# Patient Record
Sex: Female | Born: 1960 | Race: White | Hispanic: No | Marital: Married | Smoking: Former smoker
Health system: Southern US, Community
[De-identification: ages and names within clinical notes are randomized; demographics above are authoritative.]

## PROBLEM LIST (undated history)

## (undated) DIAGNOSIS — F329 Major depressive disorder, single episode, unspecified: Secondary | ICD-10-CM

## (undated) DIAGNOSIS — E875 Hyperkalemia: Secondary | ICD-10-CM

## (undated) DIAGNOSIS — M109 Gout, unspecified: Secondary | ICD-10-CM

## (undated) DIAGNOSIS — L409 Psoriasis, unspecified: Secondary | ICD-10-CM

## (undated) DIAGNOSIS — N179 Acute kidney failure, unspecified: Secondary | ICD-10-CM

## (undated) DIAGNOSIS — F32A Depression, unspecified: Secondary | ICD-10-CM

## (undated) DIAGNOSIS — R87619 Unspecified abnormal cytological findings in specimens from cervix uteri: Secondary | ICD-10-CM

## (undated) DIAGNOSIS — M1990 Unspecified osteoarthritis, unspecified site: Secondary | ICD-10-CM

## (undated) DIAGNOSIS — S42301A Unspecified fracture of shaft of humerus, right arm, initial encounter for closed fracture: Secondary | ICD-10-CM

## (undated) DIAGNOSIS — R188 Other ascites: Secondary | ICD-10-CM

## (undated) DIAGNOSIS — K219 Gastro-esophageal reflux disease without esophagitis: Secondary | ICD-10-CM

## (undated) DIAGNOSIS — K469 Unspecified abdominal hernia without obstruction or gangrene: Secondary | ICD-10-CM

## (undated) DIAGNOSIS — I10 Essential (primary) hypertension: Secondary | ICD-10-CM

## (undated) DIAGNOSIS — K7030 Alcoholic cirrhosis of liver without ascites: Secondary | ICD-10-CM

## (undated) DIAGNOSIS — D649 Anemia, unspecified: Secondary | ICD-10-CM

## (undated) DIAGNOSIS — IMO0002 Reserved for concepts with insufficient information to code with codable children: Secondary | ICD-10-CM

## (undated) HISTORY — PX: DILATION AND CURETTAGE OF UTERUS: SHX78

## (undated) HISTORY — PX: COLPOSCOPY: SHX161

## (undated) SURGERY — Surgical Case
Anesthesia: *Unknown

---

## 2000-05-15 ENCOUNTER — Encounter: Admission: RE | Admit: 2000-05-15 | Discharge: 2000-05-20 | Payer: Self-pay | Admitting: *Deleted

## 2005-10-20 ENCOUNTER — Emergency Department (HOSPITAL_COMMUNITY): Admission: EM | Admit: 2005-10-20 | Discharge: 2005-10-20 | Payer: Self-pay | Admitting: Emergency Medicine

## 2006-08-10 ENCOUNTER — Encounter: Admission: RE | Admit: 2006-08-10 | Discharge: 2006-08-10 | Payer: Self-pay | Admitting: Internal Medicine

## 2006-11-19 ENCOUNTER — Encounter: Admission: RE | Admit: 2006-11-19 | Discharge: 2006-11-19 | Payer: Self-pay | Admitting: Internal Medicine

## 2011-12-27 ENCOUNTER — Inpatient Hospital Stay (HOSPITAL_COMMUNITY): Payer: Self-pay

## 2011-12-27 ENCOUNTER — Inpatient Hospital Stay (HOSPITAL_COMMUNITY)
Admission: AD | Admit: 2011-12-27 | Discharge: 2011-12-27 | Disposition: A | Discharge status: Home / Self Care | Payer: Self-pay | Source: Ambulatory Visit | Attending: Obstetrics & Gynecology | Admitting: Obstetrics & Gynecology

## 2011-12-27 ENCOUNTER — Ambulatory Visit: Payer: Self-pay | Admitting: Family Medicine

## 2011-12-27 ENCOUNTER — Encounter (HOSPITAL_COMMUNITY): Payer: Self-pay | Admitting: *Deleted

## 2011-12-27 VITALS — BP 109/72 | HR 85 | Temp 98.2°F | Resp 16 | Ht 63.5 in | Wt 191.0 lb

## 2011-12-27 DIAGNOSIS — IMO0001 Reserved for inherently not codable concepts without codable children: Secondary | ICD-10-CM

## 2011-12-27 DIAGNOSIS — R1030 Lower abdominal pain, unspecified: Secondary | ICD-10-CM

## 2011-12-27 DIAGNOSIS — N949 Unspecified condition associated with female genital organs and menstrual cycle: Secondary | ICD-10-CM | POA: Insufficient documentation

## 2011-12-27 DIAGNOSIS — R109 Unspecified abdominal pain: Secondary | ICD-10-CM

## 2011-12-27 DIAGNOSIS — M7918 Myalgia, other site: Secondary | ICD-10-CM

## 2011-12-27 HISTORY — DX: Gastro-esophageal reflux disease without esophagitis: K21.9

## 2011-12-27 HISTORY — DX: Major depressive disorder, single episode, unspecified: F32.9

## 2011-12-27 HISTORY — DX: Reserved for concepts with insufficient information to code with codable children: IMO0002

## 2011-12-27 HISTORY — DX: Unspecified abnormal cytological findings in specimens from cervix uteri: R87.619

## 2011-12-27 HISTORY — DX: Unspecified osteoarthritis, unspecified site: M19.90

## 2011-12-27 HISTORY — DX: Depression, unspecified: F32.A

## 2011-12-27 HISTORY — DX: Unspecified abdominal hernia without obstruction or gangrene: K46.9

## 2011-12-27 HISTORY — DX: Essential (primary) hypertension: I10

## 2011-12-27 LAB — POCT CBC
Granulocyte percent: 80.3 %G — AB (ref 37–80)
HCT, POC: 38.2 % (ref 37.7–47.9)
Hemoglobin: 13.2 g/dL (ref 12.2–16.2)
Lymph, poc: 2.6 (ref 0.6–3.4)
MCH, POC: 32.7 pg — AB (ref 27–31.2)
MCHC: 34.6 g/dL (ref 31.8–35.4)
MCV: 94.5 fL (ref 80–97)
MID (cbc): 1.1 — AB (ref 0–0.9)
MPV: 8.2 fL (ref 0–99.8)
POC Granulocyte: 15.1 — AB (ref 2–6.9)
POC LYMPH PERCENT: 14 %L (ref 10–50)
POC MID %: 5.7 %M (ref 0–12)
Platelet Count, POC: 310 10*3/uL (ref 142–424)
RBC: 4.04 M/uL (ref 4.04–5.48)
RDW, POC: 13.1 %
WBC: 18.8 10*3/uL — AB (ref 4.6–10.2)

## 2011-12-27 LAB — POCT URINALYSIS DIPSTICK
Blood, UA: NEGATIVE
Glucose, UA: NEGATIVE
Ketones, UA: NEGATIVE
Leukocytes, UA: NEGATIVE
Nitrite, UA: NEGATIVE
Protein, UA: NEGATIVE
Spec Grav, UA: 1.01
Urobilinogen, UA: 0.2
pH, UA: 6.5

## 2011-12-27 LAB — POCT UA - MICROSCOPIC ONLY
Casts, Ur, LPF, POC: NEGATIVE
Crystals, Ur, HPF, POC: NEGATIVE
Mucus, UA: NEGATIVE
Yeast, UA: NEGATIVE

## 2011-12-27 MED ORDER — HYDROCODONE-ACETAMINOPHEN 5-325 MG PO TABS: 2.0000 | ORAL_TABLET | Freq: Four times a day (QID) | ORAL | Status: AC | PRN

## 2011-12-27 NOTE — Discharge Instructions (Signed)
Musculoskeletal Pain   Musculoskeletal pain is muscle and boney aches and pains. These pains can occur in any part of the body. Your caregiver may treat you without knowing the cause of the pain. They may treat you if blood or urine tests, X-rays, and other tests were normal.   CAUSES   There is often not a definite cause or reason for these pains. These pains may be caused by a type of germ (virus). The discomfort may also come from overuse. Overuse includes working out too hard when your body is not fit. Boney aches also come from weather changes. Bone is sensitive to atmospheric pressure changes.   HOME CARE INSTRUCTIONS   Ask when your test results will be ready. Make sure you get your test results.   Only take over-the-counter or prescription medicines for pain, discomfort, or fever as directed by your caregiver. If you were given medications for your condition, do not drive, operate machinery or power tools, or sign legal documents for 24 hours. Do not drink alcohol. Do not take sleeping pills or other medications that may interfere with treatment.   Continue all activities unless the activities cause more pain. When the pain lessens, slowly resume normal activities. Gradually increase the intensity and duration of the activities or exercise.   During periods of severe pain, bed rest may be helpful. Lay or sit in any position that is comfortable.   Putting ice on the injured area.   Put ice in a bag.   Place a towel between your skin and the bag.   Leave the ice on for 15 to 20 minutes, 3 to 4 times a day.   Follow up with your caregiver for continued problems and no reason can be found for the pain. If the pain becomes worse or does not go away, it may be necessary to repeat tests or do additional testing. Your caregiver may need to look further for a possible cause.   SEEK IMMEDIATE MEDICAL CARE IF:   You have pain that is getting worse and is not relieved by medications.   You develop chest pain that is  associated with shortness or breath, sweating, feeling sick to your stomach (nauseous), or throw up (vomit).   Your pain becomes localized to the abdomen.   You develop any new symptoms that seem different or that concern you.   MAKE SURE YOU:   Understand these instructions.   Will watch your condition.   Will get help right away if you are not doing well or get worse.   Document Released: 07/21/2005 Document Revised: 07/10/2011 Document Reviewed: 03/10/2008   [employer]® Patient Information ©2012 [employer].

## 2011-12-27 NOTE — MAU Note (Signed)
Pt reports having pelvic pain for the past 2 nights. Took Magnesium citrate had a several good BM but pain has gotten worse.  Hurts to walk and move.

## 2011-12-27 NOTE — MAU Note (Signed)
Onset of lower abdominal pain for two days [facility] was sent here for further evaluation, no burning with urination was tested for UTI told does not have an infection, last period years ago.

## 2011-12-27 NOTE — Progress Notes (Signed)
51 yo woman with bilateral lower abdominal pain for 2 days following doing heavy laundry duty on Wednesday.  No fevers, urinary symptoms(no dysuria, frequency, nocturia but she does take the lasix daily)  She took mag citrate but pain continued despite the "clean out". She has gone through "change", no menses in several years.  Patient has chronic low back pain. She has a h/o fibroids on ultrasound. She also has h/o abnormal pap with cryo freezing and subsequent "normal" pap. (Patient has had no pap in years)  Objective:  Mild distress sitting or lying Chest:  Clear Heart:  Reg, no murmur Abdomen: very tender suprapubic area with some rebound  SLR:  Negative Gait:  Patient walks slowly and carefully  Results for orders placed in visit on 12/27/11  POCT CBC      Component Value Range   WBC 18.8 (*) 4.6 - 10.2 (K/uL)   Lymph, poc 2.6  0.6 - 3.4    POC LYMPH PERCENT 14.0  10 - 50 (%L)   MID (cbc) 1.1 (*) 0 - 0.9    POC MID % 5.7  0 - 12 (%M)   POC Granulocyte 15.1 (*) 2 - 6.9    Granulocyte percent 80.3 (*) 37 - 80 (%G)   RBC 4.04  4.04 - 5.48 (M/uL)   Hemoglobin 13.2  12.2 - 16.2 (g/dL)   HCT, POC 16.1  09.6 - 47.9 (%)   MCV 94.5  80 - 97 (fL)   MCH, POC 32.7 (*) 27 - 31.2 (pg)   MCHC 34.6  31.8 - 35.4 (g/dL)   RDW, POC 04.5     Platelet Count, POC 310  142 - 424 (K/uL)   MPV 8.2  0 - 99.8 (fL)  POCT UA - MICROSCOPIC ONLY      Component Value Range   WBC, Ur, HPF, POC 0-3     RBC, urine, microscopic 0-1     Bacteria, U Microscopic trace     Mucus, UA neg     Epithelial cells, urine per micros 1-3     Crystals, Ur, HPF, POC neg     Casts, Ur, LPF, POC neg     Yeast, UA neg    POCT URINALYSIS DIPSTICK      Component Value Range   Color, UA amber     Clarity, UA hazy     Glucose, UA neg     Bilirubin, UA small     Ketones, UA neg     Spec Grav, UA 1.010     Blood, UA neg     pH, UA 6.5     Protein, UA neg     Urobilinogen, UA 0.2     Nitrite, UA neg     Leukocytes, UA Negative     Pelvic (done with difficulty owing to body habitus and discomfort):  NEFG, very tender with cervical motion, very tender fundus.  A:  Endometritis vs degenerating fibroid  P: to MAU at [facility]

## 2011-12-27 NOTE — Patient Instructions (Signed)
[facility]   

## 2011-12-27 NOTE — MAU Provider Note (Signed)
History     CSN: [id number]  Arrival date and time: 12/27/11 1214   First Provider Initiated Contact with Patient 12/27/11 1409     51 y.o.G1P0010 Chief Complaint  Patient presents with  . Abdominal Pain   HPI Pt [facility] with lower abdominal and suprapubic pain with sudden onset last night.  She lifted heavy baskets of laundry yesterday and the pain started about 1 hour after this. She cannot take NSAIDs r/t GI irritation. She denies vaginal bleeding, vaginal itching/burning, urinary symptoms, h/a, dizziness, n/v, or fever/chills.     Pertinent Gynecological History: Menses: post-menopausal Bleeding: None  Contraception: none DES exposure: denies Blood transfusions: none Sexually transmitted diseases: no past history Previous GYN Procedures: cryo  Last mammogram: Unknown  Last pap: normal Date: 6 years ago   Past Medical History  Diagnosis Date  . Hypertension   . GERD (gastroesophageal reflux disease)   . Hernia   . Abnormal Pap smear     displasia had colposcopy  . Depression     not currently on meds  . Arthritis     Past Surgical History  Procedure Date  . Dilation and curettage of uterus   . Colposcopy     Family History  Problem Relation Age of Onset  . Hypertension Mother   . Diabetes Mother   . Hypertension Father   . Diabetes Brother     History  Substance Use Topics  . Smoking status: Current Everyday Smoker -- 1.0 packs/day    Types: Cigarettes  . Smokeless tobacco: Not on file  . Alcohol Use: Yes     occational    Allergies:  Allergies  Allergen Reactions  . Sulfa Antibiotics Rash    Causes nausea.    Prescriptions prior to admission  Medication Sig Dispense Refill  . ALPRAZolam (XANAX) 0.5 MG tablet Take 0.5 mg by mouth 3 (three) times daily as needed.      Marland Kitchen atenolol (TENORMIN) 100 MG tablet Take 100 mg by mouth daily.      . furosemide (LASIX) 20 MG tablet Take 20 mg by mouth 2 (two) times daily.        . traMADol (ULTRAM) 50 MG tablet Take 50 mg by mouth every 6 (six) hours as needed.      . zolpidem (AMBIEN) 10 MG tablet Take 10 mg by mouth at bedtime as needed.        Review of Systems  Constitutional: Negative for fever, chills and malaise/fatigue.  Eyes: Negative for blurred vision.  Respiratory: Negative for cough and shortness of breath.   Cardiovascular: Negative for chest pain.  Gastrointestinal: Positive for abdominal pain. Negative for heartburn, nausea and vomiting.  Genitourinary: Negative for dysuria, urgency and frequency.  Musculoskeletal: Positive for joint pain.  Neurological: Negative for dizziness and headaches.  Psychiatric/Behavioral: Negative for depression.   Physical Exam   Blood pressure 113/73, temperature 97.7 F (36.5 C), temperature source Oral, height 5' 3.5" (1.613 m), weight 87 kg (191 lb 12.8 oz).  Physical Exam  Nursing note and vitals reviewed. Constitutional: She is oriented to person, place, and time. She appears well-developed and well-nourished.  Neck: Normal range of motion.  Cardiovascular: Normal rate, regular rhythm and normal heart sounds.   Respiratory: Effort normal and breath sounds normal.  GI: Soft. Bowel sounds are normal. There is tenderness in the right lower quadrant, suprapubic area and left lower quadrant. There is no guarding, no CVA tenderness, no tenderness at McBurney's point  and negative Murphy's sign. A hernia is present.  Musculoskeletal: Normal range of motion.  Neurological: She is alert and oriented to person, place, and time.  Skin: Skin is warm and dry.  Psychiatric: She has a normal mood and affect. Her behavior is normal. Judgment and thought content normal.  Pelvic exam deferred:  Performed at Urgent care today  Labs done at urgent care: Results for orders placed in visit on 12/27/11 (from the past 24 hour(s))  POCT CBC     Status: Abnormal   Collection Time   12/27/11 10:31 AM      Component Value Range    WBC 18.8 (*) 4.6 - 10.2 (K/uL)   Lymph, poc 2.6  0.6 - 3.4    POC LYMPH PERCENT 14.0  10 - 50 (%L)   MID (cbc) 1.1 (*) 0 - 0.9    POC MID % 5.7  0 - 12 (%M)   POC Granulocyte 15.1 (*) 2 - 6.9    Granulocyte percent 80.3 (*) 37 - 80 (%G)   RBC 4.04  4.04 - 5.48 (M/uL)   Hemoglobin 13.2  12.2 - 16.2 (g/dL)   HCT, POC 16.1  09.6 - 47.9 (%)   MCV 94.5  80 - 97 (fL)   MCH, POC 32.7 (*) 27 - 31.2 (pg)   MCHC 34.6  31.8 - 35.4 (g/dL)   RDW, POC 04.5     Platelet Count, POC 310  142 - 424 (K/uL)   MPV 8.2  0 - 99.8 (fL)  POCT UA - MICROSCOPIC ONLY     Status: Normal   Collection Time   12/27/11 10:31 AM      Component Value Range   WBC, Ur, HPF, POC 0-3     RBC, urine, microscopic 0-1     Bacteria, U Microscopic trace     Mucus, UA neg     Epithelial cells, urine per micros 1-3     Crystals, Ur, HPF, POC neg     Casts, Ur, LPF, POC neg     Yeast, UA neg    POCT URINALYSIS DIPSTICK     Status: Normal   Collection Time   12/27/11 10:31 AM      Component Value Range   Color, UA amber     Clarity, UA hazy     Glucose, UA neg     Bilirubin, UA small     Ketones, UA neg     Spec Grav, UA 1.010     Blood, UA neg     pH, UA 6.5     Protein, UA neg     Urobilinogen, UA 0.2     Nitrite, UA neg     Leukocytes, UA Negative     MAU Course  Procedures Pelvic U/S US Transvaginal Non-ob  12/27/2011  *RADIOLOGY REPORT*  Clinical Data: Pelvic pain.  History of fibroids.  Elevated white count. Prior colonoscopy, D&C.  Gravida 1, para 0.  Postmenopausal.  TRANSABDOMINAL AND TRANSVAGINAL ULTRASOUND OF PELVIS Technique:  Both transabdominal and transvaginal ultrasound examinations of the pelvis were performed. Transabdominal technique was performed for global imaging of the pelvis including uterus, ovaries, adnexal regions, and pelvic cul-de-sac.  Comparison: None.   It was necessary to proceed with endovaginal exam following the transabdominal exam to visualize the endometrium and ovaries.   Findings:  Uterus: The uterus is 6.6 x 3.3 x 4.6 cm.  No uterine mass identified.  Endometrium: Estimated to be 4.5 mm but difficult to visualize.  Right ovary:  Normal appearance, 1.8 x 1.1 x 1.0 cm.  Left ovary: Normal appearance, 2.0 x 1.0 x 1.7 cm.  Probable paraovarian cyst on the left, 1.4 cm in diameter.  Other findings: No free pelvic fluid.  Right lower quadrant small bowel loop is evaluated sonographically.  No abnormal features.  IMPRESSION:  1.  Poor visualization of the endometrium. 2.  No evidence for acute pelvic abnormality. 3.  Small left para ovarian cyst.  Original Report Authenticated By: Patterson Hammersmith, M.D.   US Pelvis Complete  12/27/2011  *RADIOLOGY REPORT*  Clinical Data: Pelvic pain.  History of fibroids.  Elevated white count. Prior colonoscopy, D&C.  Gravida 1, para 0.  Postmenopausal.  TRANSABDOMINAL AND TRANSVAGINAL ULTRASOUND OF PELVIS Technique:  Both transabdominal and transvaginal ultrasound examinations of the pelvis were performed. Transabdominal technique was performed for global imaging of the pelvis including uterus, ovaries, adnexal regions, and pelvic cul-de-sac.  Comparison: None.   It was necessary to proceed with endovaginal exam following the transabdominal exam to visualize the endometrium and ovaries.  Findings:  Uterus: The uterus is 6.6 x 3.3 x 4.6 cm.  No uterine mass identified.  Endometrium: Estimated to be 4.5 mm but difficult to visualize.  Right ovary:  Normal appearance, 1.8 x 1.1 x 1.0 cm.  Left ovary: Normal appearance, 2.0 x 1.0 x 1.7 cm.  Probable paraovarian cyst on the left, 1.4 cm in diameter.  Other findings: No free pelvic fluid.  Right lower quadrant small bowel loop is evaluated sonographically.  No abnormal features.  IMPRESSION:  1.  Poor visualization of the endometrium. 2.  No evidence for acute pelvic abnormality. 3.  Small left para ovarian cyst.  Original Report Authenticated By: Patterson Hammersmith, M.D.    Assessment and Plan    Musculoskeletal pain  Called Dr Despina Hidden to discuss assessment and findings D/C home Lortab 5/325 2 tabs q 6 h F/U with gyn provider--pt given info for financial assistance  Return to MAU as needed  LEFTWICH-KIRBY, Yehudit Fulginiti 12/27/2011, 2:33 PM

## 2014-06-05 ENCOUNTER — Encounter (HOSPITAL_COMMUNITY): Payer: Self-pay | Admitting: *Deleted

## 2014-12-27 ENCOUNTER — Ambulatory Visit (INDEPENDENT_AMBULATORY_CARE_PROVIDER_SITE_OTHER): Payer: 59 | Admitting: Family Medicine

## 2014-12-27 VITALS — BP 122/74 | HR 83 | Temp 98.8°F | Resp 17 | Ht 63.5 in | Wt 183.0 lb

## 2014-12-27 DIAGNOSIS — M25572 Pain in left ankle and joints of left foot: Secondary | ICD-10-CM

## 2014-12-27 DIAGNOSIS — L03116 Cellulitis of left lower limb: Secondary | ICD-10-CM

## 2014-12-27 DIAGNOSIS — L409 Psoriasis, unspecified: Secondary | ICD-10-CM | POA: Diagnosis not present

## 2014-12-27 DIAGNOSIS — M25475 Effusion, left foot: Secondary | ICD-10-CM | POA: Diagnosis not present

## 2014-12-27 LAB — POCT CBC
GRANULOCYTE PERCENT: 65.9 % (ref 37–80)
HCT, POC: 42.9 % (ref 37.7–47.9)
HEMOGLOBIN: 14.2 g/dL (ref 12.2–16.2)
LYMPH, POC: 2.7 (ref 0.6–3.4)
MCH, POC: 34.5 pg — AB (ref 27–31.2)
MCHC: 33 g/dL (ref 31.8–35.4)
MCV: 104.5 fL — AB (ref 80–97)
MID (cbc): 0.5 (ref 0–0.9)
MPV: 7 fL (ref 0–99.8)
PLATELET COUNT, POC: 316 10*3/uL (ref 142–424)
POC GRANULOCYTE: 6.2 (ref 2–6.9)
POC LYMPH %: 28.8 % (ref 10–50)
POC MID %: 5.3 % (ref 0–12)
RBC: 4.11 M/uL (ref 4.04–5.48)
RDW, POC: 14 %
WBC: 9.4 10*3/uL (ref 4.6–10.2)

## 2014-12-27 LAB — URIC ACID: Uric Acid, Serum: 10.7 mg/dL — ABNORMAL HIGH (ref 2.4–7.0)

## 2014-12-27 MED ORDER — COLCHICINE 0.6 MG PO TABS: ORAL_TABLET | ORAL | Status: DC

## 2014-12-27 MED ORDER — DOXYCYCLINE HYCLATE 100 MG PO TABS: 100.0000 mg | ORAL_TABLET | Freq: Two times a day (BID) | ORAL | Status: DC

## 2014-12-27 NOTE — Progress Notes (Addendum)
Subjective:    Patient ID: Christine [name], female    DOB: 1960/09/15, 55 y.o.   MRN: [id number] This chart was scribed for Christine Staggers, MD by Littie Deeds, Medical Scribe. This patient was seen in Room 8 and the patient's care was started at 9:20 AM.    HPI HPI Comments: Christine [name] is a 54 y.o. female with a hx of psoriasis, GERD and arthritis who [facility] complaining of gradual onset, worsening left foot pain that started 5 days ago. Patient notes having redness to the area that started about 2 days ago and also has swelling to the area. She believes this is a flare-up of psoriasis, which was first diagnosed less than 2 years ago. Patient denies fever and chills. She also denies injury. Patient states she has had problems with her left foot several years ago for which she had XR imaging; at the time, she had redness and pain to her foot, but no swelling. The pain at that time was not as severe as her current pain. She has been seeing Dr. Terri Piedra, dermatologist since 08/10/14. She has recently finished two 20+ day courses of prednisone; her last course was finished about 2 weeks ago. Patient was supposed to start Humira last week, but she contracted a UTI last week, for which she was started on Cipro 500 mg BID 6 days ago. She will finish her course of antibiotics tomorrow.   Her GERD is followed by Dr. [name]. She has no known history of peptic ulcer disease. She was advised to not take anti-inflammatories.  She does take Norco 10-325 mg every 6 hours for arthritis pain.  Patient also reports smoking and drinking a few drinks of alcohol every few nights. She does not have any problems with alcohol, but she would like to cut back. She does feel somewhat guilty about drinking. Patient denies agitation and SI/HI.  Her primary care provider is GARBA,LAWAL, MD.  There are no active problems to display for this patient.  Past Medical History  Diagnosis Date  .  Hypertension   . GERD (gastroesophageal reflux disease)   . Hernia   . Abnormal Pap smear     displasia had colposcopy  . Depression     not currently on meds  . Arthritis    Past Surgical History  Procedure Laterality Date  . Dilation and curettage of uterus    . Colposcopy     Allergies  Allergen Reactions  . Sulfa Antibiotics Rash    Causes nausea.   Prior to Admission medications   Medication Sig Start Date End Date Taking? Authorizing Provider  ALPRAZolam Prudy Feeler) 0.5 MG tablet Take 0.5 mg by mouth 3 (three) times daily as needed.   Yes Historical Provider, MD  atenolol (TENORMIN) 100 MG tablet Take 100 mg by mouth daily.   Yes Historical Provider, MD  cetirizine (ZYRTEC) 10 MG tablet Take 10 mg by mouth as needed. Used nasal congestion.   Yes Historical Provider, MD  furosemide (LASIX) 20 MG tablet Take 20 mg by mouth 2 (two) times daily.   Yes Historical Provider, MD  HYDROcodone-acetaminophen (NORCO) 10-325 MG per tablet Take 1 tablet by mouth as needed. Used for arthritis in shoulder and hip pain.   Yes Historical Provider, MD  omeprazole (PRILOSEC) 20 MG capsule Take 20 mg by mouth daily.   Yes Historical Provider, MD  zolpidem (AMBIEN) 10 MG tablet Take 10 mg by mouth at bedtime as  needed.   Yes Historical Provider, MD  traMADol (ULTRAM) 50 MG tablet Take 50 mg by mouth every 6 (six) hours as needed.    Historical Provider, MD   History   Social History  . Marital Status: Married    Spouse Name: N/A  . Number of Children: N/A  . Years of Education: N/A   Occupational History  . Not on file.   Social History Main Topics  . Smoking status: Current Every Day Smoker -- 1.00 packs/day for 10 years    Types: Cigarettes  . Smokeless tobacco: Not on file  . Alcohol Use: 0.0 oz/week    0 Standard drinks or equivalent per week     Comment: occational  . Drug Use: No  . Sexual Activity: Not on file   Other Topics Concern  . Not on file   Social History Narrative       Review of Systems  Musculoskeletal: Positive for joint swelling.  Skin: Positive for color change and rash.  Psychiatric/Behavioral: Negative for suicidal ideas and agitation.       Objective:   Physical Exam  Constitutional: She is oriented to person, place, and time. She appears well-developed and well-nourished. No distress.  HENT:  Head: Normocephalic and atraumatic.  Mouth/Throat: Oropharynx is clear and moist. No oropharyngeal exudate.  Eyes: Pupils are equal, round, and reactive to light.  Neck: Neck supple.  Cardiovascular: Normal rate.   Capillary refill < 1 second.  Pulmonary/Chest: Effort normal.  Musculoskeletal: She exhibits no edema.  Neurological: She is alert and oriented to person, place, and time. No cranial nerve deficit.  NVI distally.  Skin: Skin is warm and dry. Rash noted. There is erythema.  Left foot: quarter-sized hyperpigmented slightly scaly plaques that measures 2.5 cm with surrounding erythema. Soft tissue swelling extending from the mid foot to the dorsum of the foot, primarily towards to great toe. Erythema stops prior to the IP joint. No active discharge from the skin. No intradigital cracks or discharge. Plantar foot is unaffected. Exquisitely tender over the first MTP. Tenderness across the dorsal foot. Guarded with ROM of the great toe, but able to flex and extend.   Diffuse psoriatic plaques across the legs bilaterally with scaling and few raw appearing areas on the lower legs, but no surrounding erythema on the legs.  Psychiatric: She has a normal mood and affect. Her behavior is normal.  Vitals reviewed.     Filed Vitals:   12/27/14 0846  BP: 122/74  Pulse: 83  Temp: 98.8 F (37.1 C)  TempSrc: Oral  Resp: 17  Height: 5' 3.5" (1.613 m)  Weight: 183 lb (83.008 kg)  SpO2: 98%    Results for orders placed or performed in visit on 12/27/14  POCT CBC  Result Value Ref Range   WBC 9.4 4.6 - 10.2 K/uL   Lymph, poc 2.7 0.6 - 3.4    POC LYMPH PERCENT 28.8 10 - 50 %L   MID (cbc) 0.5 0 - 0.9   POC MID % 5.3 0 - 12 %M   POC Granulocyte 6.2 2 - 6.9   Granulocyte percent 65.9 37 - 80 %G   RBC 4.11 4.04 - 5.48 M/uL   Hemoglobin 14.2 12.2 - 16.2 g/dL   HCT, POC 44.0 10.2 - 47.9 %   MCV 104.5 (A) 80 - 97 fL   MCH, POC 34.5 (A) 27 - 31.2 pg   MCHC 33.0 31.8 - 35.4 g/dL   RDW, POC 72.5 %  Platelet Count, POC 316 142 - 424 K/uL   MPV 7.0 0 - 99.8 fL       Assessment & Plan:   Christine NamJanet M Collings is a 54 y.o. female Pain in joint, ankle and foot, left - Plan: Uric Acid, colchicine 0.6 MG tablet  Swelling of foot joint, left - Plan: POCT CBC, doxycycline (VIBRA-TABS) 100 MG tablet, colchicine 0.6 MG tablet  Cellulitis of foot, left - Plan: POCT CBC, doxycycline (VIBRA-TABS) 100 MG tablet  Psoriasis   Underlying severe psoriasis with involved area on dorsum of foot - possible secondary cellulitis, however also with redness and pain originating from 1st MTP - ddx includes gout.   -colchicine (SED) and avoiding NSAIDS with prior recommendations to avoid NSAIDS by GI.   -doxycycline BID to cover for cellulitis.   -has norco if needed for pain.   -recheck in 2 days - sooner if worse.   Meds ordered this encounter  Medications  . doxycycline (VIBRA-TABS) 100 MG tablet    Sig: Take 1 tablet (100 mg total) by mouth 2 (two) times daily.    Dispense:  20 tablet    Refill:  0  . colchicine 0.6 MG tablet    Sig: 2 tabs po x1, then 1 tab po x 1 in 1 hour if needed.    Dispense:  15 tablet    Refill:  0   Patient Instructions  Start doxycycline twice per day, and colchicine today in case this is gout. Continue your hydrocodone if needed. Recheck in 2 days. Return to the clinic or go to the nearest emergency room if any of your symptoms worsen or new symptoms occur.        I personally performed the services described in this documentation, which was scribed in my presence. The recorded information has been reviewed and  considered, and addended by me as needed.

## 2014-12-27 NOTE — Patient Instructions (Signed)
Start doxycycline twice per day, and colchicine today in case this is gout. Continue your hydrocodone if needed. Recheck in 2 days. Return to the clinic or go to the nearest emergency room if any of your symptoms worsen or new symptoms occur.

## 2014-12-29 ENCOUNTER — Ambulatory Visit (INDEPENDENT_AMBULATORY_CARE_PROVIDER_SITE_OTHER): Payer: 59 | Admitting: Family Medicine

## 2014-12-29 VITALS — BP 122/74 | HR 88 | Temp 98.7°F | Resp 17 | Ht 63.0 in | Wt 180.0 lb

## 2014-12-29 DIAGNOSIS — L409 Psoriasis, unspecified: Secondary | ICD-10-CM | POA: Diagnosis not present

## 2014-12-29 DIAGNOSIS — M1009 Idiopathic gout, multiple sites: Secondary | ICD-10-CM

## 2014-12-29 DIAGNOSIS — M109 Gout, unspecified: Secondary | ICD-10-CM

## 2014-12-29 DIAGNOSIS — M25572 Pain in left ankle and joints of left foot: Secondary | ICD-10-CM

## 2014-12-29 NOTE — Patient Instructions (Signed)
You can take colchicine up to twice per day today, then taper down to once per day in next few days as symptoms improve. If redness not fading Sunday - start doxycycline for possible infection in addition to gout. Recheck in next 3-4 days if this area is not continuing to improve - sooner if worse.   See information below for gout. You may need to be on a daily medicine for gout, but would recommend you discuss this with primary care provider.   Gout Gout is an inflammatory arthritis caused by a buildup of uric acid crystals in the joints. Uric acid is a chemical that is normally present in the blood. When the level of uric acid in the blood is too high it can form crystals that deposit in your joints and tissues. This causes joint redness, soreness, and swelling (inflammation). Repeat attacks are common. Over time, uric acid crystals can form into masses (tophi) near a joint, destroying bone and causing disfigurement. Gout is treatable and often preventable. CAUSES  The disease begins with elevated levels of uric acid in the blood. Uric acid is produced by your body when it breaks down a naturally found substance called purines. Certain foods you eat, such as meats and fish, contain high amounts of purines. Causes of an elevated uric acid level include:  Being passed down from parent to child (heredity).  Diseases that cause increased uric acid production (such as obesity, psoriasis, and certain cancers).  Excessive alcohol use.  Diet, especially diets rich in meat and seafood.  Medicines, including certain cancer-fighting medicines (chemotherapy), water pills (diuretics), and aspirin.  Chronic kidney disease. The kidneys are no longer able to remove uric acid well.  Problems with metabolism. Conditions strongly associated with gout include:  Obesity.  High blood pressure.  High cholesterol.  Diabetes. Not everyone with elevated uric acid levels gets gout. It is not understood why  some people get gout and others do not. Surgery, joint injury, and eating too much of certain foods are some of the factors that can lead to gout attacks. SYMPTOMS   An attack of gout comes on quickly. It causes intense pain with redness, swelling, and warmth in a joint.  Fever can occur.  Often, only one joint is involved. Certain joints are more commonly involved:  Base of the big toe.  Knee.  Ankle.  Wrist.  Finger. Without treatment, an attack usually goes away in a few days to weeks. Between attacks, you usually will not have symptoms, which is different from many other forms of arthritis. DIAGNOSIS  Your caregiver will suspect gout based on your symptoms and exam. In some cases, tests may be recommended. The tests may include:  Blood tests.  Urine tests.  X-rays.  Joint fluid exam. This exam requires a needle to remove fluid from the joint (arthrocentesis). Using a microscope, gout is confirmed when uric acid crystals are seen in the joint fluid. TREATMENT  There are two phases to gout treatment: treating the sudden onset (acute) attack and preventing attacks (prophylaxis).  Treatment of an Acute Attack.  Medicines are used. These include anti-inflammatory medicines or steroid medicines.  An injection of steroid medicine into the affected joint is sometimes necessary.  The painful joint is rested. Movement can worsen the arthritis.  You may use warm or cold treatments on painful joints, depending which works best for you.  Treatment to Prevent Attacks.  If you suffer from frequent gout attacks, your caregiver may advise preventive medicine. These  medicines are started after the acute attack subsides. These medicines either help your kidneys eliminate uric acid from your body or decrease your uric acid production. You may need to stay on these medicines for a very long time.  The early phase of treatment with preventive medicine can be associated with an increase in  acute gout attacks. For this reason, during the first few months of treatment, your caregiver may also advise you to take medicines usually used for acute gout treatment. Be sure you understand your caregiver's directions. Your caregiver may make several adjustments to your medicine dose before these medicines are effective.  Discuss dietary treatment with your caregiver or dietitian. Alcohol and drinks high in sugar and fructose and foods such as meat, poultry, and seafood can increase uric acid levels. Your caregiver or dietitian can advise you on drinks and foods that should be limited. HOME CARE INSTRUCTIONS   Do not take aspirin to relieve pain. This raises uric acid levels.  Only take over-the-counter or prescription medicines for pain, discomfort, or fever as directed by your caregiver.  Rest the joint as much as possible. When in bed, keep sheets and blankets off painful areas.  Keep the affected joint raised (elevated).  Apply warm or cold treatments to painful joints. Use of warm or cold treatments depends on which works best for you.  Use crutches if the painful joint is in your leg.  Drink enough fluids to keep your urine clear or pale yellow. This helps your body get rid of uric acid. Limit alcohol, sugary drinks, and fructose drinks.  Follow your dietary instructions. Pay careful attention to the amount of protein you eat. Your daily diet should emphasize fruits, vegetables, whole grains, and fat-free or low-fat milk products. Discuss the use of coffee, vitamin C, and cherries with your caregiver or dietitian. These may be helpful in lowering uric acid levels.  Maintain a healthy body weight. SEEK MEDICAL CARE IF:   You develop diarrhea, vomiting, or any side effects from medicines.  You do not feel better in 24 hours, or you are getting worse. SEEK IMMEDIATE MEDICAL CARE IF:   Your joint becomes suddenly more tender, and you have chills or a fever. MAKE SURE YOU:    Understand these instructions.  Will watch your condition.  Will get help right away if you are not doing well or get worse. Document Released: 07/18/2000 Document Revised: 12/05/2013 Document Reviewed: 03/03/2012 [facility] Patient Information 2015 [place], [place]. This information is not intended to replace advice given to you by your health care provider. Make sure you discuss any questions you have with your health care provider.

## 2014-12-29 NOTE — Progress Notes (Signed)
Subjective:  Patient ID: Christine [name], female    DOB: July 23, 1961  Age: 54 y.o. MRN: [id number]  CC:  Chief Complaint  Patient presents with  . Follow-up    gout     HPI [name] presents for recheck of foot pain and swelling. She has a history of underlying severe psoriasis with involved area on dorsum of foot.  Thought to have possible secondary cellulitis when evaluated 2 days ago, however also with redness and pain originating from 1st MTP - ddx included gout. Started on colchicine (avoiding NSAIDS with prior recommendations to avoid NSAIDS by GI) and doxycycline BID to cover for cellulitis.   Today - had not started doxycycline yet - was taking Cipro until today - talked to pharmacist and was recommended to finish Cipro first. Took 2 colchicine, then 1 an hour later. Pain started to improve last night, redness and swelling is subsiding, but still some soreness, stiffness, swelling. No fever.no discharge form wound.   Results for orders placed or performed in visit on 12/27/14  Uric Acid  Result Value Ref Range   Uric Acid, Serum 10.7 (H) 2.4 - 7.0 mg/dL    History There are no active problems to display for this patient.  Past Medical History  Diagnosis Date  . Hypertension   . GERD (gastroesophageal reflux disease)   . Hernia   . Abnormal Pap smear     displasia had colposcopy  . Depression     not currently on meds  . Arthritis    Past Surgical History  Procedure Laterality Date  . Dilation and curettage of uterus    . Colposcopy     Allergies  Allergen Reactions  . Sulfa Antibiotics Rash    Causes nausea.   Prior to Admission medications   Medication Sig Start Date End Date Taking? Authorizing Provider  ALPRAZolam Prudy Feeler) 0.5 MG tablet Take 0.5 mg by mouth 3 (three) times daily as needed.   Yes Historical Provider, MD  atenolol (TENORMIN) 100 MG tablet Take 100 mg by mouth daily.   Yes Historical Provider, MD  cetirizine (ZYRTEC) 10 MG tablet Take 10  mg by mouth as needed. Used nasal congestion.   Yes Historical Provider, MD  colchicine 0.6 MG tablet 2 tabs po x1, then 1 tab po x 1 in 1 hour if needed. 12/27/14  Yes Shade Flood, MD  furosemide (LASIX) 20 MG tablet Take 20 mg by mouth 2 (two) times daily.   Yes Historical Provider, MD  HYDROcodone-acetaminophen (NORCO) 10-325 MG per tablet Take 1 tablet by mouth as needed. Used for arthritis in shoulder and hip pain.   Yes Historical Provider, MD  zolpidem (AMBIEN) 10 MG tablet Take 10 mg by mouth at bedtime as needed.   Yes Historical Provider, MD  doxycycline (VIBRA-TABS) 100 MG tablet Take 1 tablet (100 mg total) by mouth 2 (two) times daily. Patient not taking: Reported on 12/29/2014 12/27/14   Shade Flood, MD  omeprazole (PRILOSEC) 20 MG capsule Take 20 mg by mouth daily.    Historical Provider, MD  traMADol (ULTRAM) 50 MG tablet Take 50 mg by mouth every 6 (six) hours as needed.    Historical Provider, MD   History   Social History  . Marital Status: Married    Spouse Name: N/A  . Number of Children: N/A  . Years of Education: N/A   Occupational History  . Not on file.   Social History Main Topics  . Smoking  status: Current Every Day Smoker -- 1.00 packs/day for 10 years    Types: Cigarettes  . Smokeless tobacco: Not on file  . Alcohol Use: 0.0 oz/week    0 Standard drinks or equivalent per week     Comment: occational  . Drug Use: No  . Sexual Activity: Not on file   Other Topics Concern  . Not on file   Social History Narrative    Review of Systems  Constitutional: Negative for fever and chills.  Musculoskeletal: Positive for arthralgias (left foot - especially into great toe. ).  Skin: Positive for color change (less red but still red on foot. no new areas. ).    Objective:  BP 122/74 mmHg  Pulse 88  Temp(Src) 98.7 F (37.1 C) (Oral)  Resp 17  Ht  (1.6 m)  Wt 180 lb (81.647 kg)  BMI 31.89 kg/m2  SpO2 97%  Physical Exam  Constitutional:  She appears well-developed and well-nourished. No distress.  Skin: Skin is warm and intact. There is erythema.  Diffuse psoriatic plaques.  L foot: erythema limted to outlined area, but slightly faded. Still some warmth, with ttp primarily over 1st MTP, but ttp over dorsal foot as well. Dorsal foot circular psoriatic plaque just proximal to erythematous area.       Assessment & Plan:  Christine [name] is a 54 y.o. female . Gouty arthritis Pain in joint, ankle and foot, left Psoriasis  - slight improvement after colchicine, and elevated uric acid. Suspect gout flare.   - can continue colchicine with tapering doses as improving.  -if redness is not continuing to improve in next 2 days - then start doxycycline.   -rtc precautions.   -follow up with PCP re: gout treatment.    No orders of the defined types were placed in this encounter.   Patient Instructions  You can take colchicine up to twice per day today, then taper down to once per day in next few days as symptoms improve. If redness not fading Sunday - start doxycycline for possible infection in addition to gout. Recheck in next 3-4 days if this area is not continuing to improve - sooner if worse.   See information below for gout. You may need to be on a daily medicine for gout, but would recommend you discuss this with primary care provider.   Gout Gout is an inflammatory arthritis caused by a buildup of uric acid crystals in the joints. Uric acid is a chemical that is normally present in the blood. When the level of uric acid in the blood is too high it can form crystals that deposit in your joints and tissues. This causes joint redness, soreness, and swelling (inflammation). Repeat attacks are common. Over time, uric acid crystals can form into masses (tophi) near a joint, destroying bone and causing disfigurement. Gout is treatable and often preventable. CAUSES  The disease begins with elevated levels of uric acid in the blood.  Uric acid is produced by your body when it breaks down a naturally found substance called purines. Certain foods you eat, such as meats and fish, contain high amounts of purines. Causes of an elevated uric acid level include:  Being passed down from parent to child (heredity).  Diseases that cause increased uric acid production (such as obesity, psoriasis, and certain cancers).  Excessive alcohol use.  Diet, especially diets rich in meat and seafood.  Medicines, including certain cancer-fighting medicines (chemotherapy), water pills (diuretics), and aspirin.  Chronic kidney  disease. The kidneys are no longer able to remove uric acid well.  Problems with metabolism. Conditions strongly associated with gout include:  Obesity.  High blood pressure.  High cholesterol.  Diabetes. Not everyone with elevated uric acid levels gets gout. It is not understood why some people get gout and others do not. Surgery, joint injury, and eating too much of certain foods are some of the factors that can lead to gout attacks. SYMPTOMS   An attack of gout comes on quickly. It causes intense pain with redness, swelling, and warmth in a joint.  Fever can occur.  Often, only one joint is involved. Certain joints are more commonly involved:  Base of the big toe.  Knee.  Ankle.  Wrist.  Finger. Without treatment, an attack usually goes away in a few days to weeks. Between attacks, you usually will not have symptoms, which is different from many other forms of arthritis. DIAGNOSIS  Your caregiver will suspect gout based on your symptoms and exam. In some cases, tests may be recommended. The tests may include:  Blood tests.  Urine tests.  X-rays.  Joint fluid exam. This exam requires a needle to remove fluid from the joint (arthrocentesis). Using a microscope, gout is confirmed when uric acid crystals are seen in the joint fluid. TREATMENT  There are two phases to gout treatment: treating  the sudden onset (acute) attack and preventing attacks (prophylaxis).  Treatment of an Acute Attack.  Medicines are used. These include anti-inflammatory medicines or steroid medicines.  An injection of steroid medicine into the affected joint is sometimes necessary.  The painful joint is rested. Movement can worsen the arthritis.  You may use warm or cold treatments on painful joints, depending which works best for you.  Treatment to Prevent Attacks.  If you suffer from frequent gout attacks, your caregiver may advise preventive medicine. These medicines are started after the acute attack subsides. These medicines either help your kidneys eliminate uric acid from your body or decrease your uric acid production. You may need to stay on these medicines for a very long time.  The early phase of treatment with preventive medicine can be associated with an increase in acute gout attacks. For this reason, during the first few months of treatment, your caregiver may also advise you to take medicines usually used for acute gout treatment. Be sure you understand your caregiver's directions. Your caregiver may make several adjustments to your medicine dose before these medicines are effective.  Discuss dietary treatment with your caregiver or dietitian. Alcohol and drinks high in sugar and fructose and foods such as meat, poultry, and seafood can increase uric acid levels. Your caregiver or dietitian can advise you on drinks and foods that should be limited. HOME CARE INSTRUCTIONS   Do not take aspirin to relieve pain. This raises uric acid levels.  Only take over-the-counter or prescription medicines for pain, discomfort, or fever as directed by your caregiver.  Rest the joint as much as possible. When in bed, keep sheets and blankets off painful areas.  Keep the affected joint raised (elevated).  Apply warm or cold treatments to painful joints. Use of warm or cold treatments depends on which  works best for you.  Use crutches if the painful joint is in your leg.  Drink enough fluids to keep your urine clear or pale yellow. This helps your body get rid of uric acid. Limit alcohol, sugary drinks, and fructose drinks.  Follow your dietary instructions. Pay careful attention  to the amount of protein you eat. Your daily diet should emphasize fruits, vegetables, whole grains, and fat-free or low-fat milk products. Discuss the use of coffee, vitamin C, and cherries with your caregiver or dietitian. These may be helpful in lowering uric acid levels.  Maintain a healthy body weight. SEEK MEDICAL CARE IF:   You develop diarrhea, vomiting, or any side effects from medicines.  You do not feel better in 24 hours, or you are getting worse. SEEK IMMEDIATE MEDICAL CARE IF:   Your joint becomes suddenly more tender, and you have chills or a fever. MAKE SURE YOU:   Understand these instructions.  Will watch your condition.  Will get help right away if you are not doing well or get worse. Document Released: 07/18/2000 Document Revised: 12/05/2013 Document Reviewed: 03/03/2012 [facility] Patient Information 2015 [place], [place]. This information is not intended to replace advice given to you by your health care provider. Make sure you discuss any questions you have with your health care provider.     Signed, [name], MD [facility]

## 2015-01-23 ENCOUNTER — Ambulatory Visit (INDEPENDENT_AMBULATORY_CARE_PROVIDER_SITE_OTHER): Payer: 59 | Admitting: Urgent Care

## 2015-01-23 VITALS — BP 106/70 | HR 93 | Temp 98.7°F | Resp 18

## 2015-01-23 DIAGNOSIS — L409 Psoriasis, unspecified: Secondary | ICD-10-CM | POA: Diagnosis not present

## 2015-01-23 DIAGNOSIS — M109 Gout, unspecified: Secondary | ICD-10-CM

## 2015-01-23 DIAGNOSIS — M1009 Idiopathic gout, multiple sites: Secondary | ICD-10-CM | POA: Diagnosis not present

## 2015-01-23 DIAGNOSIS — M25474 Effusion, right foot: Secondary | ICD-10-CM

## 2015-01-23 MED ORDER — COLCHICINE 0.6 MG PO TABS: 0.6000 mg | ORAL_TABLET | Freq: Two times a day (BID) | ORAL | Status: DC

## 2015-01-23 NOTE — Progress Notes (Signed)
    MRN: [id number] DOB: 23-Oct-1960  Subjective:   Christine [name] is a 54 y.o. female with pmh of gout, psoriasis and chronic pain presenting for chief complaint of Gout and Foot Pain  Reports several day history of worsening, severe, sharp, nonradiating right great toe pain with associated redness and swelling over her first right MTP joint. She currently does not have any colchicine which was used about 3 weeks ago for a gout flare in her left great toe. This pain is exactly the same except now it's in her right foot and was worse the previous time. Denies any other aggravating or relieving factors, no other questions or concerns.  Christine [name] has a current medication list which includes the following prescription(s): alprazolam, atenolol, cetirizine, diphenhydramine, furosemide, hydrocodone-acetaminophen, omeprazole, zolpidem, colchicine, doxycycline, and tramadol. She is allergic to sulfa antibiotics.  Christine [name]  has a past medical history of Hypertension; GERD (gastroesophageal reflux disease); Hernia; Abnormal Pap smear; Depression; and Arthritis. Also  has past surgical history that includes Dilation and curettage of uterus and Colposcopy.  ROS As in subjective.  Objective:   Vitals: BP 106/70 mmHg  Pulse 93  Temp(Src) 98.7 F (37.1 C) (Oral)  Resp 18  SpO2 98%  Physical Exam  Constitutional: She is oriented to person, place, and time. She appears well-developed and well-nourished. She appears distressed (due to pain).  Cardiovascular: Normal rate.   Pulmonary/Chest: Effort normal.  Musculoskeletal:       Right ankle: She exhibits decreased range of motion (over area depicted) and swelling (over area depicted). She exhibits no ecchymosis, no deformity and no laceration. Tenderness (over area depicted).       Feet:  Patient also has dry, scaly psoriatic plaques bilaterally over her lower legs.  Neurological: She is alert and oriented to person, place, and time.    Recent Results  (from the past 2160 hour(s))  Uric Acid     Status: Abnormal   Collection Time: 12/27/14  9:42 AM  Result Value Ref Range   Uric Acid, Serum 10.7 (H) 2.4 - 7.0 mg/dL  POCT CBC     Status: Abnormal   Collection Time: 12/27/14  9:47 AM  Result Value Ref Range   WBC 9.4 4.6 - 10.2 K/uL   Lymph, poc 2.7 0.6 - 3.4   POC LYMPH PERCENT 28.8 10 - 50 %L   MID (cbc) 0.5 0 - 0.9   POC MID % 5.3 0 - 12 %M   POC Granulocyte 6.2 2 - 6.9   Granulocyte percent 65.9 37 - 80 %G   RBC 4.11 4.04 - 5.48 M/uL   Hemoglobin 14.2 12.2 - 16.2 g/dL   HCT, POC 19.6 22.2 - 47.9 %   MCV 104.5 (A) 80 - 97 fL   MCH, POC 34.5 (A) 27 - 31.2 pg   MCHC 33.0 31.8 - 35.4 g/dL   RDW, POC 97.9 %   Platelet Count, POC 316 142 - 424 K/uL   MPV 7.0 0 - 99.8 fL   Assessment and Plan :   1. Gouty arthritis 2. Swelling of foot joint, right - Restart Colchicine, provided instructions on how dosing, recommended she follow up with her PCP to discuss prevention with Allopurinol. Follow up as needed.  3. Psoriasis - Continue follow up with Dr. Ozella Almond, PA-C [facility] [phone] 01/23/2015 7:24 PM

## 2015-01-23 NOTE — Patient Instructions (Addendum)
Gout Flare treated with colchicine: take 2 tablets now, then 1 tablet 1 hour later. The next day, switch to 1 tablet twice daily with food.  Gout Gout is an inflammatory arthritis caused by a buildup of uric acid crystals in the joints. Uric acid is a chemical that is normally present in the blood. When the level of uric acid in the blood is too high it can form crystals that deposit in your joints and tissues. This causes joint redness, soreness, and swelling (inflammation). Repeat attacks are common. Over time, uric acid crystals can form into masses (tophi) near a joint, destroying bone and causing disfigurement. Gout is treatable and often preventable. CAUSES  The disease begins with elevated levels of uric acid in the blood. Uric acid is produced by your body when it breaks down a naturally found substance called purines. Certain foods you eat, such as meats and fish, contain high amounts of purines. Causes of an elevated uric acid level include:  Being passed down from parent to child (heredity).  Diseases that cause increased uric acid production (such as obesity, psoriasis, and certain cancers).  Excessive alcohol use.  Diet, especially diets rich in meat and seafood.  Medicines, including certain cancer-fighting medicines (chemotherapy), water pills (diuretics), and aspirin.  Chronic kidney disease. The kidneys are no longer able to remove uric acid well.  Problems with metabolism. Conditions strongly associated with gout include:  Obesity.  High blood pressure.  High cholesterol.  Diabetes. Not everyone with elevated uric acid levels gets gout. It is not understood why some people get gout and others do not. Surgery, joint injury, and eating too much of certain foods are some of the factors that can lead to gout attacks. SYMPTOMS   An attack of gout comes on quickly. It causes intense pain with redness, swelling, and warmth in a joint.  Fever can occur.  Often, only one  joint is involved. Certain joints are more commonly involved:  Base of the big toe.  Knee.  Ankle.  Wrist.  Finger. Without treatment, an attack usually goes away in a few days to weeks. Between attacks, you usually will not have symptoms, which is different from many other forms of arthritis. DIAGNOSIS  Your caregiver will suspect gout based on your symptoms and exam. In some cases, tests may be recommended. The tests may include:  Blood tests.  Urine tests.  X-rays.  Joint fluid exam. This exam requires a needle to remove fluid from the joint (arthrocentesis). Using a microscope, gout is confirmed when uric acid crystals are seen in the joint fluid. TREATMENT  There are two phases to gout treatment: treating the sudden onset (acute) attack and preventing attacks (prophylaxis).  Treatment of an Acute Attack.  Medicines are used. These include anti-inflammatory medicines or steroid medicines.  An injection of steroid medicine into the affected joint is sometimes necessary.  The painful joint is rested. Movement can worsen the arthritis.  You may use warm or cold treatments on painful joints, depending which works best for you.  Treatment to Prevent Attacks.  If you suffer from frequent gout attacks, your caregiver may advise preventive medicine. These medicines are started after the acute attack subsides. These medicines either help your kidneys eliminate uric acid from your body or decrease your uric acid production. You may need to stay on these medicines for a very long time.  The early phase of treatment with preventive medicine can be associated with an increase in acute gout attacks. For  this reason, during the first few months of treatment, your caregiver may also advise you to take medicines usually used for acute gout treatment. Be sure you understand your caregiver's directions. Your caregiver may make several adjustments to your medicine dose before these medicines  are effective.  Discuss dietary treatment with your caregiver or dietitian. Alcohol and drinks high in sugar and fructose and foods such as meat, poultry, and seafood can increase uric acid levels. Your caregiver or dietitian can advise you on drinks and foods that should be limited. HOME CARE INSTRUCTIONS   Do not take aspirin to relieve pain. This raises uric acid levels.  Only take over-the-counter or prescription medicines for pain, discomfort, or fever as directed by your caregiver.  Rest the joint as much as possible. When in bed, keep sheets and blankets off painful areas.  Keep the affected joint raised (elevated).  Apply warm or cold treatments to painful joints. Use of warm or cold treatments depends on which works best for you.  Use crutches if the painful joint is in your leg.  Drink enough fluids to keep your urine clear or pale yellow. This helps your body get rid of uric acid. Limit alcohol, sugary drinks, and fructose drinks.  Follow your dietary instructions. Pay careful attention to the amount of protein you eat. Your daily diet should emphasize fruits, vegetables, whole grains, and fat-free or low-fat milk products. Discuss the use of coffee, vitamin C, and cherries with your caregiver or dietitian. These may be helpful in lowering uric acid levels.  Maintain a healthy body weight. SEEK MEDICAL CARE IF:   You develop diarrhea, vomiting, or any side effects from medicines.  You do not feel better in 24 hours, or you are getting worse. SEEK IMMEDIATE MEDICAL CARE IF:   Your joint becomes suddenly more tender, and you have chills or a fever. MAKE SURE YOU:   Understand these instructions.  Will watch your condition.  Will get help right away if you are not doing well or get worse. Document Released: 07/18/2000 Document Revised: 12/05/2013 Document Reviewed: 03/03/2012 [facility] Patient Information 2015 [place], [place]. This information is not intended to replace  advice given to you by your health care provider. Make sure you discuss any questions you have with your health care provider.

## 2015-01-25 ENCOUNTER — Telehealth: Payer: Self-pay

## 2015-01-25 NOTE — Telephone Encounter (Signed)
Yes she can take imodium for the diarrhea.

## 2015-01-25 NOTE — Telephone Encounter (Signed)
Patient was recently seen and she states that the gout medication is giving her diarrhea. She would like to know if she can take modiam for it. Please advise! [phone]

## 2015-01-26 NOTE — Telephone Encounter (Signed)
Spoke with pt, advised message from Nicole. Pt understood. 

## 2015-03-11 ENCOUNTER — Ambulatory Visit (INDEPENDENT_AMBULATORY_CARE_PROVIDER_SITE_OTHER): Payer: 59 | Admitting: Emergency Medicine

## 2015-03-11 ENCOUNTER — Ambulatory Visit (INDEPENDENT_AMBULATORY_CARE_PROVIDER_SITE_OTHER): Payer: 59

## 2015-03-11 VITALS — BP 128/86 | HR 76 | Temp 98.4°F | Resp 18 | Ht 63.0 in | Wt 181.4 lb

## 2015-03-11 DIAGNOSIS — W19XXXA Unspecified fall, initial encounter: Secondary | ICD-10-CM | POA: Diagnosis not present

## 2015-03-11 DIAGNOSIS — S4291XA Fracture of right shoulder girdle, part unspecified, initial encounter for closed fracture: Secondary | ICD-10-CM | POA: Diagnosis not present

## 2015-03-11 DIAGNOSIS — M25511 Pain in right shoulder: Secondary | ICD-10-CM | POA: Diagnosis not present

## 2015-03-11 DIAGNOSIS — M25531 Pain in right wrist: Secondary | ICD-10-CM | POA: Diagnosis not present

## 2015-03-11 MED ORDER — OXYCODONE-ACETAMINOPHEN 5-325 MG PO TABS: 1.0000 | ORAL_TABLET | ORAL | Status: DC | PRN

## 2015-03-11 NOTE — Progress Notes (Addendum)
Subjective:  Patient ID: Christine [name], female    DOB: 05-May-1961  Age: 54 y.o. MRN: [id number]  CC: Arm Injury   HPI Christine [name] presents  following a fall a week ago she began experiencing pain in the right shoulder right wrist denies any other injury related to the fall. She said she can't use her right arm. She is taking over-the-counter medication with no improvement. Pain is worse instead of getting better in shoulder.  History Anthony has a past medical history of Hypertension; GERD (gastroesophageal reflux disease); Hernia; Abnormal Pap smear; Depression; and Arthritis.   She has past surgical history that includes Dilation and curettage of uterus and Colposcopy.   Her  family history includes Diabetes in her brother and mother; Hypertension in her father and mother.  She   reports that she has been smoking Cigarettes.  She has a 10 pack-year smoking history. She does not have any smokeless tobacco history on file. She reports that she drinks alcohol. She reports that she does not use illicit drugs.  Outpatient Prescriptions Prior to Visit  Medication Sig Dispense Refill  . ALPRAZolam (XANAX) 0.5 MG tablet Take 0.5 mg by mouth 3 (three) times daily as needed.    Marland Kitchen atenolol (TENORMIN) 100 MG tablet Take 100 mg by mouth daily.    . cetirizine (ZYRTEC) 10 MG tablet Take 10 mg by mouth as needed. Used nasal congestion.    . colchicine 0.6 MG tablet Take 1 tablet (0.6 mg total) by mouth 2 (two) times daily. 30 tablet 2  . diphenhydrAMINE (BENADRYL) 25 MG tablet Take 25 mg by mouth every 6 (six) hours as needed.    . furosemide (LASIX) 20 MG tablet Take 20 mg by mouth 2 (two) times daily.    Marland Kitchen HYDROcodone-acetaminophen (NORCO) 10-325 MG per tablet Take 1 tablet by mouth as needed. Used for arthritis in shoulder and hip pain.    Marland Kitchen omeprazole (PRILOSEC) 20 MG capsule Take 20 mg by mouth daily.    . traMADol (ULTRAM) 50 MG tablet Take 50 mg by mouth every 6 (six) hours as needed.     . zolpidem (AMBIEN) 10 MG tablet Take 10 mg by mouth at bedtime as needed.    . doxycycline (VIBRA-TABS) 100 MG tablet Take 1 tablet (100 mg total) by mouth 2 (two) times daily. (Patient not taking: Reported on 12/29/2014) 20 tablet 0   No facility-administered medications prior to visit.    History   Social History  . Marital Status: Married    Spouse Name: N/A  . Number of Children: N/A  . Years of Education: N/A   Social History Main Topics  . Smoking status: Current Every Day Smoker -- 1.00 packs/day for 10 years    Types: Cigarettes  . Smokeless tobacco: Not on file  . Alcohol Use: 0.0 oz/week    0 Standard drinks or equivalent per week     Comment: occational  . Drug Use: No  . Sexual Activity: Not on file   Other Topics Concern  . None   Social History Narrative     Review of Systems  Constitutional: Negative for fever, chills and appetite change.  HENT: Negative for congestion, ear pain, postnasal drip, sinus pressure and sore throat.   Eyes: Negative for pain and redness.  Respiratory: Negative for cough, shortness of breath and wheezing.   Cardiovascular: Negative for leg swelling.  Gastrointestinal: Negative for nausea, vomiting, abdominal pain, diarrhea, constipation and blood in stool.  Endocrine: Negative for polyuria.  Genitourinary: Negative for dysuria, urgency, frequency and flank pain.  Musculoskeletal: Negative for gait problem.  Skin: Negative for rash.  Neurological: Negative for weakness and headaches.  Psychiatric/Behavioral: Negative for confusion and decreased concentration. The patient is not nervous/anxious.     Objective:  BP 128/86 mmHg  Pulse 76  Temp(Src) 98.4 F (36.9 C) (Oral)  Resp 18  Ht 5\' 3"  (1.6 m)  Wt 181 lb 6.4 oz (82.283 kg)  BMI 32.14 kg/m2  SpO2 97%  Physical Exam  Constitutional: She is oriented to person, place, and time. She appears well-developed and well-nourished.  HENT:  Head: Normocephalic and atraumatic.   Eyes: Conjunctivae are normal. Pupils are equal, round, and reactive to light.  Pulmonary/Chest: Effort normal.  Musculoskeletal: She exhibits no edema.  Neurological: She is alert and oriented to person, place, and time.  Skin: Skin is dry.  Psychiatric: She has a normal mood and affect. Her behavior is normal. Thought content normal.     she has tenderness and guarding in her shoulder joint with no deformity. She's very tender and will not move her right humerus. There is no ecchymosis.   Assessment & Plan:   Christine [name] was seen today for arm injury.  Diagnoses and all orders for this visit:  Fracture, shoulder, right, closed, initial encounter Orders: -     Ambulatory referral to Orthopedic Surgery  Right shoulder pain Orders: -     DG Shoulder Right; Future -     Ambulatory referral to Orthopedic Surgery  Wrist pain, right Orders: -     DG Wrist Complete Right; Future -     Ambulatory referral to Orthopedic Surgery  Clavicle pain, right Orders: -     DG Clavicle Right; Future -     Ambulatory referral to Orthopedic Surgery  Fall, initial encounter Orders: -     Ambulatory referral to Orthopedic Surgery  Other orders -     oxyCODONE-acetaminophen (ROXICET) 5-325 MG per tablet; Take 1 tablet by mouth every 4 (four) hours as needed for severe pain.   I am having Christine [name] start on oxyCODONE-acetaminophen. I am also having her maintain her atenolol, furosemide, traMADol, ALPRAZolam, zolpidem, HYDROcodone-acetaminophen, cetirizine, omeprazole, doxycycline, diphenhydrAMINE, and colchicine.  Meds ordered this encounter  Medications  . oxyCODONE-acetaminophen (ROXICET) 5-325 MG per tablet    Sig: Take 1 tablet by mouth every 4 (four) hours as needed for severe pain.    Dispense:  30 tablet    Refill:  0    Appropriate red flag conditions were discussed with the patient as well as actions that should be taken.  Patient expressed his understanding.  Follow-up: Return if  symptoms worsen or fail to improve.  Carmelina Dane, MD   [facility] reading (PRIMARY) by  Dr. Dareen Piano.  Fracture humerus. Negative wrist

## 2015-03-11 NOTE — Patient Instructions (Signed)
Shoulder Fracture (Proximal Humerus or Glenoid) °A shoulder fracture is a broken upper arm bone or a broken socket bone. The humerus is the upper arm bone and the glenoid is the shoulder socket. Proximal means the humerus is broken near the shoulder. Most of the time the bones of a broken shoulder are in an acceptable position. Usually, the injury can be treated with a shoulder immobilizer or sling and swath bandage. These devices support the arm and prevent any shoulder movement. If the bones are not in a good position, then surgery is sometimes needed. Shoulder fractures usually initially cause swelling, pain, and discoloration around the upper arm. They heal in 8 to 12 weeks with proper treatment. °SYMPTOMS  °At the time of injury: °· Pain. °· Tenderness. °· Regular body contours are not normal. °Later symptoms may include: °· Swelling and bruising of the elbow and hand. °· Swelling and bruising of the arm or chest. °Other symptoms include: °· Pain when lifting or turning the arm. °· Paralysis below the fracture. °· Numbness or coldness below the fracture. °CAUSES  °· Indirect force from falling on an outstretched arm. °· A blow to the shoulder. °RISK INCREASES WITH: °· Not being in shape. °· Playing contact sports, such as football, soccer, hockey, or rugby. °· Sports where falling on an outstretched arm occurs, such as basketball, skateboarding, or volleyball. °· History of bone or joint disease. °· History of shoulder injury. °PREVENTION °· Warm up before activity. °· Stretch before activity. °· Stay in shape with your: °¨ Heart fitness. °¨ Flexibility. °¨ Shoulder Strength. °· Falling with the proper technique. °PROGNOSIS  °In adults, healing time is about 7 weeks. For children, healing time is about 5 weeks. Surgery may be needed. °RELATED COMPLICATIONS °· The bones do not heal together (nonunion). °· The bones do not align properly when they heal (malunion). °· Long-term problems with pain, stiffness,  swelling, or loss of motion. °· The injured arm heals shorter than the other. °· Nerves are injured in the arm. °· Arthritis in the shoulder. °· Normal bone growth is interrupted in children. °· Blood supply to the shoulder joint is diminished. °TREATMENT °If the bones are aligned, then initial treatment will be with ice and medicine to help with pain. The shoulder will be held in place with a sling (immobilization). The shoulder will be allowed to heal for up to 6 weeks. Injuries that may need surgery include: °· Severe fractures. °· Fractures that are not in appropriate alignment (displaced). °· Non-displaced fractures (not common). °Surgery helps the bones align correctly. The bones may be held in place with: °· Sutures. °· Wires. °· Rods. °· Plates. °· Screws. °· Pins. °If you have had surgery or not, you will likely be assisted by a physical therapist or athletic trainer to get the best results with your injured shoulder. This will likely include exercises to strengthen and stretch the injured and surrounding areas. °MEDICATION °· If pain medicine is needed, nonsteroidal anti-inflammatory medicines (such as aspirin or ibuprofen) or other minor pain relievers (such as acetaminophen) are often advised. °· Do not take pain medicine for 7 days before surgery. °· Stronger pain relievers may be prescribed. Use only as directed and take only as much as you need. °COLD THERAPY °Cold treatment (icing) relieves pain and reduces inflammation. Cold treatment should be applied for 10 to 15 minutes every 2 to 3 hours, and immediately after activity that aggravates your symptoms. Use ice packs or an ice massage. °SEEK IMMEDIATE   MEDICAL CARE IF: °· You have severe shoulder pain unrelieved by rest and taking pain medicine. °· You have pain, numbness, tingling, or weakness in the hand or wrist. °· You have shortness of breath, chest pain, severe weakness, or fainting. °· You have severe pain with motion of the fingers or  wrist. °· Blue, gray, or dark color appears in the fingernails on injured extremity. °Document Released: 07/21/2005 Document Revised: 10/13/2011 Document Reviewed: 11/02/2008 °[employer]® Patient Information ©2015 [employer]. This information is not intended to replace advice given to you by your health care provider. Make sure you discuss any questions you have with your health care provider. ° °

## 2016-04-18 ENCOUNTER — Inpatient Hospital Stay (HOSPITAL_COMMUNITY)
Admission: EM | Admit: 2016-04-18 | Discharge: 2016-04-25 | DRG: 897 | Disposition: A | Discharge status: Skilled Nursing Facility | Payer: BLUE CROSS/BLUE SHIELD | Attending: Internal Medicine | Admitting: Internal Medicine

## 2016-04-18 ENCOUNTER — Emergency Department (HOSPITAL_COMMUNITY): Payer: BLUE CROSS/BLUE SHIELD

## 2016-04-18 ENCOUNTER — Encounter (HOSPITAL_COMMUNITY): Payer: Self-pay | Admitting: Emergency Medicine

## 2016-04-18 DIAGNOSIS — IMO0001 Reserved for inherently not codable concepts without codable children: Secondary | ICD-10-CM

## 2016-04-18 DIAGNOSIS — F329 Major depressive disorder, single episode, unspecified: Secondary | ICD-10-CM | POA: Diagnosis present

## 2016-04-18 DIAGNOSIS — R140 Abdominal distension (gaseous): Secondary | ICD-10-CM | POA: Diagnosis present

## 2016-04-18 DIAGNOSIS — D638 Anemia in other chronic diseases classified elsewhere: Secondary | ICD-10-CM | POA: Diagnosis present

## 2016-04-18 DIAGNOSIS — R7989 Other specified abnormal findings of blood chemistry: Secondary | ICD-10-CM

## 2016-04-18 DIAGNOSIS — R778 Other specified abnormalities of plasma proteins: Secondary | ICD-10-CM | POA: Diagnosis present

## 2016-04-18 DIAGNOSIS — Z6829 Body mass index (BMI) 29.0-29.9, adult: Secondary | ICD-10-CM

## 2016-04-18 DIAGNOSIS — F10229 Alcohol dependence with intoxication, unspecified: Principal | ICD-10-CM | POA: Diagnosis present

## 2016-04-18 DIAGNOSIS — R531 Weakness: Secondary | ICD-10-CM | POA: Diagnosis not present

## 2016-04-18 DIAGNOSIS — Z833 Family history of diabetes mellitus: Secondary | ICD-10-CM

## 2016-04-18 DIAGNOSIS — R945 Abnormal results of liver function studies: Secondary | ICD-10-CM | POA: Diagnosis present

## 2016-04-18 DIAGNOSIS — E669 Obesity, unspecified: Secondary | ICD-10-CM

## 2016-04-18 DIAGNOSIS — M6281 Muscle weakness (generalized): Secondary | ICD-10-CM

## 2016-04-18 DIAGNOSIS — L409 Psoriasis, unspecified: Secondary | ICD-10-CM | POA: Diagnosis present

## 2016-04-18 DIAGNOSIS — E7220 Disorder of urea cycle metabolism, unspecified: Secondary | ICD-10-CM | POA: Diagnosis present

## 2016-04-18 DIAGNOSIS — F1010 Alcohol abuse, uncomplicated: Secondary | ICD-10-CM | POA: Diagnosis present

## 2016-04-18 DIAGNOSIS — F419 Anxiety disorder, unspecified: Secondary | ICD-10-CM | POA: Diagnosis present

## 2016-04-18 DIAGNOSIS — M1990 Unspecified osteoarthritis, unspecified site: Secondary | ICD-10-CM | POA: Diagnosis present

## 2016-04-18 DIAGNOSIS — K7200 Acute and subacute hepatic failure without coma: Secondary | ICD-10-CM

## 2016-04-18 DIAGNOSIS — E872 Acidosis, unspecified: Secondary | ICD-10-CM

## 2016-04-18 DIAGNOSIS — Z79899 Other long term (current) drug therapy: Secondary | ICD-10-CM

## 2016-04-18 DIAGNOSIS — E538 Deficiency of other specified B group vitamins: Secondary | ICD-10-CM | POA: Diagnosis present

## 2016-04-18 DIAGNOSIS — K760 Fatty (change of) liver, not elsewhere classified: Secondary | ICD-10-CM | POA: Diagnosis present

## 2016-04-18 DIAGNOSIS — K828 Other specified diseases of gallbladder: Secondary | ICD-10-CM | POA: Diagnosis present

## 2016-04-18 DIAGNOSIS — K7011 Alcoholic hepatitis with ascites: Secondary | ICD-10-CM | POA: Diagnosis present

## 2016-04-18 DIAGNOSIS — R162 Hepatomegaly with splenomegaly, not elsewhere classified: Secondary | ICD-10-CM | POA: Diagnosis present

## 2016-04-18 DIAGNOSIS — K7031 Alcoholic cirrhosis of liver with ascites: Secondary | ICD-10-CM | POA: Diagnosis present

## 2016-04-18 DIAGNOSIS — F1020 Alcohol dependence, uncomplicated: Secondary | ICD-10-CM

## 2016-04-18 DIAGNOSIS — K7040 Alcoholic hepatic failure without coma: Secondary | ICD-10-CM | POA: Diagnosis present

## 2016-04-18 DIAGNOSIS — I10 Essential (primary) hypertension: Secondary | ICD-10-CM | POA: Diagnosis present

## 2016-04-18 DIAGNOSIS — D539 Nutritional anemia, unspecified: Secondary | ICD-10-CM | POA: Diagnosis present

## 2016-04-18 DIAGNOSIS — Y906 Blood alcohol level of 120-199 mg/100 ml: Secondary | ICD-10-CM | POA: Diagnosis present

## 2016-04-18 DIAGNOSIS — M109 Gout, unspecified: Secondary | ICD-10-CM | POA: Diagnosis present

## 2016-04-18 DIAGNOSIS — E876 Hypokalemia: Secondary | ICD-10-CM | POA: Diagnosis present

## 2016-04-18 DIAGNOSIS — K219 Gastro-esophageal reflux disease without esophagitis: Secondary | ICD-10-CM | POA: Diagnosis present

## 2016-04-18 DIAGNOSIS — F17210 Nicotine dependence, cigarettes, uncomplicated: Secondary | ICD-10-CM | POA: Diagnosis present

## 2016-04-18 DIAGNOSIS — I248 Other forms of acute ischemic heart disease: Secondary | ICD-10-CM | POA: Diagnosis present

## 2016-04-18 DIAGNOSIS — R188 Other ascites: Secondary | ICD-10-CM

## 2016-04-18 DIAGNOSIS — F10929 Alcohol use, unspecified with intoxication, unspecified: Secondary | ICD-10-CM | POA: Diagnosis present

## 2016-04-18 DIAGNOSIS — Z8249 Family history of ischemic heart disease and other diseases of the circulatory system: Secondary | ICD-10-CM

## 2016-04-18 DIAGNOSIS — Z882 Allergy status to sulfonamides status: Secondary | ICD-10-CM

## 2016-04-18 DIAGNOSIS — R739 Hyperglycemia, unspecified: Secondary | ICD-10-CM | POA: Diagnosis present

## 2016-04-18 HISTORY — DX: Unspecified fracture of shaft of humerus, right arm, initial encounter for closed fracture: S42.301A

## 2016-04-18 HISTORY — DX: Gout, unspecified: M10.9

## 2016-04-18 HISTORY — DX: Anemia, unspecified: D64.9

## 2016-04-18 HISTORY — DX: Psoriasis, unspecified: L40.9

## 2016-04-18 LAB — COMPREHENSIVE METABOLIC PANEL
ALBUMIN: 2.3 g/dL — AB (ref 3.5–5.0)
ALK PHOS: 279 U/L — AB (ref 38–126)
ALT: 54 U/L (ref 14–54)
ANION GAP: 17 — AB (ref 5–15)
AST: 249 U/L — ABNORMAL HIGH (ref 15–41)
BILIRUBIN TOTAL: 10.6 mg/dL — AB (ref 0.3–1.2)
BUN: <5 mg/dL — ABNORMAL LOW (ref 6–20)
CALCIUM: 7.3 mg/dL — AB (ref 8.9–10.3)
CO2: 19 mmol/L — ABNORMAL LOW (ref 22–32)
Chloride: 99 mmol/L — ABNORMAL LOW (ref 101–111)
Creatinine, Ser: <0.3 mg/dL — ABNORMAL LOW (ref 0.44–1.00)
Glucose, Bld: 108 mg/dL — ABNORMAL HIGH (ref 65–99)
POTASSIUM: 3.1 mmol/L — AB (ref 3.5–5.1)
Sodium: 135 mmol/L (ref 135–145)
TOTAL PROTEIN: 6.8 g/dL (ref 6.5–8.1)

## 2016-04-18 LAB — CBC WITH DIFFERENTIAL/PLATELET
BASOS PCT: 0 %
Basophils Absolute: 0 10*3/uL (ref 0.0–0.1)
Eosinophils Absolute: 0 10*3/uL (ref 0.0–0.7)
Eosinophils Relative: 0 %
HEMATOCRIT: 25.4 % — AB (ref 36.0–46.0)
HEMOGLOBIN: 8.4 g/dL — AB (ref 12.0–15.0)
LYMPHS ABS: 1 10*3/uL (ref 0.7–4.0)
Lymphocytes Relative: 12 %
MCH: 39.3 pg — AB (ref 26.0–34.0)
MCHC: 33.1 g/dL (ref 30.0–36.0)
MCV: 118.7 fL — AB (ref 78.0–100.0)
MONO ABS: 0.8 10*3/uL (ref 0.1–1.0)
MONOS PCT: 10 %
NEUTROS ABS: 6.3 10*3/uL (ref 1.7–7.7)
NEUTROS PCT: 78 %
Platelets: 203 10*3/uL (ref 150–400)
RBC: 2.14 MIL/uL — ABNORMAL LOW (ref 3.87–5.11)
RDW: 22.7 % — AB (ref 11.5–15.5)
WBC: 8 10*3/uL (ref 4.0–10.5)

## 2016-04-18 LAB — BLOOD GAS, ARTERIAL
Acid-base deficit: 5.6 mmol/L — ABNORMAL HIGH (ref 0.0–2.0)
BICARBONATE: 16.5 mmol/L — AB (ref 20.0–28.0)
Drawn by: 103701
FIO2: 0.21
O2 SAT: 95.8 %
PATIENT TEMPERATURE: 98.6
PO2 ART: 83.5 mmHg (ref 83.0–108.0)
pCO2 arterial: 23 mmHg — ABNORMAL LOW (ref 32.0–48.0)
pH, Arterial: 7.47 — ABNORMAL HIGH (ref 7.350–7.450)

## 2016-04-18 LAB — APTT: APTT: 39 s — AB (ref 24–36)

## 2016-04-18 LAB — URINALYSIS, ROUTINE W REFLEX MICROSCOPIC
Glucose, UA: NEGATIVE mg/dL
Hgb urine dipstick: NEGATIVE
KETONES UR: 15 mg/dL — AB
NITRITE: POSITIVE — AB
PH: 6 (ref 5.0–8.0)
PROTEIN: NEGATIVE mg/dL
Specific Gravity, Urine: 1.012 (ref 1.005–1.030)

## 2016-04-18 LAB — PROTIME-INR
INR: 1.23
PROTHROMBIN TIME: 15.6 s — AB (ref 11.4–15.2)

## 2016-04-18 LAB — URINE MICROSCOPIC-ADD ON

## 2016-04-18 LAB — AMMONIA: Ammonia: 43 umol/L — ABNORMAL HIGH (ref 9–35)

## 2016-04-18 LAB — RETICULOCYTES
RBC.: 2.14 MIL/uL — ABNORMAL LOW (ref 3.87–5.11)
RETIC CT PCT: 6.8 % — AB (ref 0.4–3.1)
Retic Count, Absolute: 145.5 10*3/uL (ref 19.0–186.0)

## 2016-04-18 LAB — IRON AND TIBC
IRON: 100 ug/dL (ref 28–170)
Saturation Ratios: 98 % — ABNORMAL HIGH (ref 10.4–31.8)
TIBC: 102 ug/dL — AB (ref 250–450)
UIBC: 2 ug/dL

## 2016-04-18 LAB — FOLATE: Folate: 2.4 ng/mL — ABNORMAL LOW (ref >5.9)

## 2016-04-18 LAB — ACETAMINOPHEN LEVEL

## 2016-04-18 LAB — VITAMIN B12: Vitamin B-12: 426 pg/mL (ref 180–914)

## 2016-04-18 LAB — ETHANOL: Alcohol, Ethyl (B): 132 mg/dL — ABNORMAL HIGH (ref <5)

## 2016-04-18 LAB — I-STAT CG4 LACTIC ACID, ED: Lactic Acid, Venous: 6.7 mmol/L (ref 0.5–1.9)

## 2016-04-18 LAB — BRAIN NATRIURETIC PEPTIDE: B NATRIURETIC PEPTIDE 5: 118.3 pg/mL — AB (ref 0.0–100.0)

## 2016-04-18 LAB — LIPASE, BLOOD: LIPASE: 19 U/L (ref 11–51)

## 2016-04-18 LAB — FERRITIN: FERRITIN: 2700 ng/mL — AB (ref 11–307)

## 2016-04-18 MED ORDER — ONDANSETRON HCL 4 MG/2ML IJ SOLN
4.0000 mg | Freq: Once | INTRAMUSCULAR | Status: AC
  Administered 2016-04-18: 4 mg via INTRAVENOUS
  Filled 2016-04-18: qty 2

## 2016-04-18 MED ORDER — IOPAMIDOL (ISOVUE-300) INJECTION 61%
15.0000 mL | Freq: Once | INTRAVENOUS | Status: AC | PRN
  Administered 2016-04-18: 15 mL via INTRAVENOUS

## 2016-04-18 MED ORDER — SODIUM CHLORIDE 0.9 % IV BOLUS (SEPSIS)
1000.0000 mL | Freq: Once | INTRAVENOUS | Status: AC
  Administered 2016-04-18: 1000 mL via INTRAVENOUS

## 2016-04-18 MED ORDER — FENTANYL CITRATE (PF) 100 MCG/2ML IJ SOLN
25.0000 ug | INTRAMUSCULAR | Status: DC | PRN
  Administered 2016-04-18 (×2): 25 ug via INTRAVENOUS
  Filled 2016-04-18 (×2): qty 2

## 2016-04-18 MED ORDER — IOPAMIDOL (ISOVUE-300) INJECTION 61%
100.0000 mL | Freq: Once | INTRAVENOUS | Status: AC | PRN
  Administered 2016-04-18: 100 mL via INTRAVENOUS

## 2016-04-18 NOTE — ED Triage Notes (Signed)
Per EMS patient has abd distention, fatigue, having trouble walking over the past couple weeks. Patient supposed to be taking Lasix but weaned herself off.  Patient taking xanax and oxy but running low on those so has decreased amount taken daily of xanax and started back drinking ETOH since out of oxy.  Patient appears to have yellow tint to skin.

## 2016-04-18 NOTE — ED Notes (Signed)
Bed: WA15 Expected date:  Expected time:  Means of arrival:  Comments: 

## 2016-04-18 NOTE — ED Notes (Signed)
RN and MD made aware of critical lactic acid value. 

## 2016-04-18 NOTE — Progress Notes (Signed)
04/18/2016 6:14 PM  I spoke with ER physician.  Pt still has outstanding labs and studies and work up not completed.  CT scan has not been done.  GI had not been called.  I asked to please call us back when work up completed given patient is critically ill.  He verbalized understanding and said his partner would call us back for consult when workup completed.    Maryln Manuel. Maudry Zeidan, MD

## 2016-04-18 NOTE — Progress Notes (Signed)
EDCM went to speak to patient at bedside, however female at bedside assisting patient.

## 2016-04-18 NOTE — Progress Notes (Signed)
EDCM went back to see patient, however patient politely declined to speak about home health services at this time.  EDCM provided patient [place] and placed in patient's belongings bag at bedside.

## 2016-04-18 NOTE — ED Provider Notes (Signed)
[facility]-EMERGENCY DEPT Provider Note   CSN: [id number] Arrival date & time: 04/18/16  1552     History   Chief Complaint Chief Complaint  Patient presents with  . abd distention  . Fatigue    HPI Christine [name] is a 55 y.o. female.  The history is provided by the patient.  Weakness  The problem has been gradually worsening. There has been no fever. Pertinent negatives include no confusion. Associated symptoms comments: 3 weeks of abdominal distention with generalized fatigue and 1 day of janduce. Associated medical issues comments: alcoholism with 1 fifth of vodka daily and 1ppd smoking.    Past Medical History:  Diagnosis Date  . Abnormal Pap smear    displasia had colposcopy  . Arthritis   . Depression    not currently on meds  . GERD (gastroesophageal reflux disease)   . Hernia   . Hypertension     Patient Active Problem List   Diagnosis Date Noted  . Gouty arthritis 01/23/2015    Past Surgical History:  Procedure Laterality Date  . COLPOSCOPY    . DILATION AND CURETTAGE OF UTERUS      OB History    Gravida Para Term Preterm AB Living   1       1 0   SAB TAB Ectopic Multiple Live Births                   Home Medications    Prior to Admission medications   Medication Sig Start Date End Date Taking? Authorizing Provider  allopurinol (ZYLOPRIM) 100 MG tablet Take 100 mg by mouth daily.   Yes Historical Provider, MD  ALPRAZolam Prudy Feeler) 0.5 MG tablet Take 0.5 mg by mouth 3 (three) times daily as needed for anxiety.    Yes Historical Provider, MD  furosemide (LASIX) 40 MG tablet Take 40 mg by mouth daily.   Yes Historical Provider, MD  HYDROcodone-acetaminophen (NORCO) 7.5-325 MG tablet Take 1 tablet by mouth every 6 (six) hours as needed for moderate pain or severe pain.   Yes Historical Provider, MD  metoprolol tartrate (LOPRESSOR) 25 MG tablet Take 25 mg by mouth 2 (two) times daily.  04/01/16  Yes Historical Provider, MD  zolpidem (AMBIEN) 10 MG tablet  Take 10 mg by mouth at bedtime as needed for sleep.    Yes Historical Provider, MD  cetirizine (ZYRTEC) 10 MG tablet Take 10 mg by mouth as needed. Used nasal congestion.    Historical Provider, MD  colchicine 0.6 MG tablet Take 1 tablet (0.6 mg total) by mouth 2 (two) times daily. Patient not taking: Reported on 04/18/2016 01/23/15   Wallis Bamberg, PA-C  diphenhydrAMINE (BENADRYL) 25 MG tablet Take 25 mg by mouth every 6 (six) hours as needed.    Historical Provider, MD  doxycycline (VIBRA-TABS) 100 MG tablet Take 1 tablet (100 mg total) by mouth 2 (two) times daily. Patient not taking: Reported on 04/18/2016 12/27/14   Shade Flood, MD  oxyCODONE-acetaminophen (ROXICET) 5-325 MG per tablet Take 1 tablet by mouth every 4 (four) hours as needed for severe pain. Patient not taking: Reported on 04/18/2016 03/11/15   Carmelina Dane, MD    Family History Family History  Problem Relation Age of Onset  . Hypertension Mother   . Diabetes Mother   . Hypertension Father   . Diabetes Brother     Social History Social History  Substance Use Topics  . Smoking status: Current Every Day Smoker  Packs/day: 1.00    Years: 10.00    Types: Cigarettes  . Smokeless tobacco: Never Used  . Alcohol use Yes     Comment: couple glasses liquor day     Allergies   Sulfa antibiotics   Review of Systems Review of Systems  Neurological: Positive for weakness.  Psychiatric/Behavioral: Negative for confusion.  All other systems reviewed and are negative.    Physical Exam Updated Vital Signs BP 114/70 (BP Location: Left Arm)   Pulse 114   Temp 98.6 F (37 C) (Oral)   Resp 19   Ht 5\' 4"  (1.626 m)   Wt 170 lb (77.1 kg)   SpO2 96%   BMI 29.18 kg/m   Physical Exam  Constitutional: She is oriented to person, place, and time. She appears listless. She has a sickly appearance. No distress.  HENT:  Head: Normocephalic.  Eyes: Conjunctivae are normal.  Neck: Neck supple. No tracheal deviation  present.  Cardiovascular: Regular rhythm and normal heart sounds.  Tachycardia present.   Pulmonary/Chest: Effort normal and breath sounds normal. No respiratory distress. She has no rhonchi. She has no rales.  Abdominal: Soft. She exhibits distension, fluid wave and ascites. There is no tenderness.  Neurological: She is oriented to person, place, and time. She appears listless.  Skin: Skin is warm and dry.  Jaundiced appearing  Psychiatric: She has a normal mood and affect.     ED Treatments / Results  Labs (all labs ordered are listed, but only abnormal results are displayed) Labs Reviewed  CBC WITH DIFFERENTIAL/PLATELET - Abnormal; Notable for the following:       Result Value   RBC 2.14 (*)    Hemoglobin 8.4 (*)    HCT 25.4 (*)    MCV 118.7 (*)    MCH 39.3 (*)    RDW 22.7 (*)    All other components within normal limits  COMPREHENSIVE METABOLIC PANEL - Abnormal; Notable for the following:    Potassium 3.1 (*)    Chloride 99 (*)    CO2 19 (*)    Glucose, Bld 108 (*)    BUN <5 (*)    Creatinine, Ser <0.30 (*)    Calcium 7.3 (*)    Albumin 2.3 (*)    AST 249 (*)    Alkaline Phosphatase 279 (*)    Total Bilirubin 10.6 (*)    Anion gap 17 (*)    All other components within normal limits  URINALYSIS, ROUTINE W REFLEX MICROSCOPIC (NOT AT [facility]) - Abnormal; Notable for the following:    Color, Urine ORANGE (*)    APPearance CLOUDY (*)    Bilirubin Urine LARGE (*)    Ketones, ur 15 (*)    Nitrite POSITIVE (*)    Leukocytes, UA TRACE (*)    All other components within normal limits  ETHANOL - Abnormal; Notable for the following:    Alcohol, Ethyl (B) 132 (*)    All other components within normal limits  APTT - Abnormal; Notable for the following:    aPTT 39 (*)    All other components within normal limits  PROTIME-INR - Abnormal; Notable for the following:    Prothrombin Time 15.6 (*)    All other components within normal limits  BRAIN NATRIURETIC PEPTIDE - Abnormal;  Notable for the following:    B Natriuretic Peptide 118.3 (*)    All other components within normal limits  AMMONIA - Abnormal; Notable for the following:    Ammonia 43 (*)  All other components within normal limits  ACETAMINOPHEN LEVEL - Abnormal; Notable for the following:    Acetaminophen (Tylenol), Serum <10 (*)    All other components within normal limits  BLOOD GAS, ARTERIAL - Abnormal; Notable for the following:    pH, Arterial 7.470 (*)    pCO2 arterial 23.0 (*)    Bicarbonate 16.5 (*)    Acid-base deficit 5.6 (*)    All other components within normal limits  URINE MICROSCOPIC-ADD ON - Abnormal; Notable for the following:    Squamous Epithelial / LPF 6-30 (*)    Bacteria, UA FEW (*)    All other components within normal limits  I-STAT CG4 LACTIC ACID, ED - Abnormal; Notable for the following:    Lactic Acid, Venous 6.70 (*)    All other components within normal limits  LIPASE, BLOOD  VITAMIN B12  FOLATE  IRON AND TIBC  FERRITIN  RETICULOCYTES    EKG  EKG Interpretation None       Radiology No results found.  Procedures Procedures (including critical care time)  CRITICAL CARE Performed by: Lyndal Pulley Total critical care time: 30 minutes Critical care time was exclusive of separately billable procedures and treating other patients. Critical care was necessary to treat or prevent imminent or life-threatening deterioration. Critical care was time spent personally by me on the following activities: development of treatment plan with patient and/or surrogate as well as nursing, discussions with consultants, evaluation of patient's response to treatment, examination of patient, obtaining history from patient or surrogate, ordering and performing treatments and interventions, ordering and review of laboratory studies, ordering and review of radiographic studies, pulse oximetry and re-evaluation of patient's condition.  Medications Ordered in ED Medications    fentaNYL (SUBLIMAZE) injection 25 mcg (25 mcg Intravenous Given 04/18/16 1738)  sodium chloride 0.9 % bolus 1,000 mL (1,000 mLs Intravenous New Bag/Given 04/18/16 1736)    Followed by  sodium chloride 0.9 % bolus 1,000 mL (0 mLs Intravenous Stopped 04/18/16 1739)  iopamidol (ISOVUE-300) 61 % injection 15 mL (15 mLs Intravenous Contrast Given 04/18/16 1744)     Initial Impression / Assessment and Plan / ED Course  I have reviewed the triage vital signs and the nursing notes.  Pertinent labs & imaging results that were available during my care of the patient were reviewed by me and considered in my medical decision making (see chart for details).  Clinical Course    55 year old female presents with acute onset liver failure after prolonged alcoholism. She has had increasing abdominal distention over the last few weeks and became jaundiced today. She is drinking a fifth of vodka daily and this has worsened since she stopped taking pain medication for chronic hip pain and back pain recently. Today's lab workup is extremely concerning for lactic acidosis, severe hyperbilirubinemia and transaminitis and large volume ascites which is new. This is presumed due to alcoholic cirrhosis but CT recommended for further characterization. Patient will be admitted for new onset liver failure workup and management. Hospitalist was consulted for admission and will see the patient in the emergency department.  Patient is critically ill and required fluid resuscitation to help clear lactic acidosis.  Hospitalist requesting CT results prior to admission. Care transferred to Dr. Fayrene Fearing pending results of scan.  Final Clinical Impressions(s) / ED Diagnoses   Final diagnoses:  Acute liver failure without hepatic coma  Alcoholism /alcohol abuse (HCC)  Alcoholic cirrhosis of liver with ascites (HCC)  Lactic acidosis  Macrocytic anemia  New Prescriptions New Prescriptions   No medications on file     Lyndal Pulley, MD 04/18/16 [phone]

## 2016-04-18 NOTE — ED Notes (Signed)
Made respiratory aware of ABG ordered 

## 2016-04-18 NOTE — ED Notes (Signed)
Patient transported to CT 

## 2016-04-19 ENCOUNTER — Encounter (HOSPITAL_COMMUNITY): Payer: Self-pay | Admitting: Internal Medicine

## 2016-04-19 ENCOUNTER — Inpatient Hospital Stay (HOSPITAL_COMMUNITY): Payer: BLUE CROSS/BLUE SHIELD

## 2016-04-19 DIAGNOSIS — F17210 Nicotine dependence, cigarettes, uncomplicated: Secondary | ICD-10-CM | POA: Diagnosis present

## 2016-04-19 DIAGNOSIS — D638 Anemia in other chronic diseases classified elsewhere: Secondary | ICD-10-CM | POA: Diagnosis present

## 2016-04-19 DIAGNOSIS — R140 Abdominal distension (gaseous): Secondary | ICD-10-CM | POA: Diagnosis present

## 2016-04-19 DIAGNOSIS — K7200 Acute and subacute hepatic failure without coma: Secondary | ICD-10-CM | POA: Diagnosis not present

## 2016-04-19 DIAGNOSIS — R739 Hyperglycemia, unspecified: Secondary | ICD-10-CM

## 2016-04-19 DIAGNOSIS — E872 Acidosis, unspecified: Secondary | ICD-10-CM | POA: Diagnosis present

## 2016-04-19 DIAGNOSIS — R110 Nausea: Secondary | ICD-10-CM

## 2016-04-19 DIAGNOSIS — D649 Anemia, unspecified: Secondary | ICD-10-CM | POA: Diagnosis not present

## 2016-04-19 DIAGNOSIS — R7989 Other specified abnormal findings of blood chemistry: Secondary | ICD-10-CM

## 2016-04-19 DIAGNOSIS — F10929 Alcohol use, unspecified with intoxication, unspecified: Secondary | ICD-10-CM | POA: Diagnosis present

## 2016-04-19 DIAGNOSIS — F329 Major depressive disorder, single episode, unspecified: Secondary | ICD-10-CM | POA: Diagnosis present

## 2016-04-19 DIAGNOSIS — K7689 Other specified diseases of liver: Secondary | ICD-10-CM | POA: Diagnosis not present

## 2016-04-19 DIAGNOSIS — I248 Other forms of acute ischemic heart disease: Secondary | ICD-10-CM | POA: Diagnosis present

## 2016-04-19 DIAGNOSIS — E669 Obesity, unspecified: Secondary | ICD-10-CM | POA: Diagnosis present

## 2016-04-19 DIAGNOSIS — R079 Chest pain, unspecified: Secondary | ICD-10-CM | POA: Diagnosis not present

## 2016-04-19 DIAGNOSIS — F1010 Alcohol abuse, uncomplicated: Secondary | ICD-10-CM | POA: Diagnosis not present

## 2016-04-19 DIAGNOSIS — K7031 Alcoholic cirrhosis of liver with ascites: Secondary | ICD-10-CM | POA: Diagnosis present

## 2016-04-19 DIAGNOSIS — K760 Fatty (change of) liver, not elsewhere classified: Secondary | ICD-10-CM | POA: Diagnosis present

## 2016-04-19 DIAGNOSIS — K828 Other specified diseases of gallbladder: Secondary | ICD-10-CM | POA: Diagnosis present

## 2016-04-19 DIAGNOSIS — Z6829 Body mass index (BMI) 29.0-29.9, adult: Secondary | ICD-10-CM | POA: Diagnosis not present

## 2016-04-19 DIAGNOSIS — K219 Gastro-esophageal reflux disease without esophagitis: Secondary | ICD-10-CM | POA: Diagnosis present

## 2016-04-19 DIAGNOSIS — R531 Weakness: Secondary | ICD-10-CM | POA: Diagnosis present

## 2016-04-19 DIAGNOSIS — M1990 Unspecified osteoarthritis, unspecified site: Secondary | ICD-10-CM | POA: Diagnosis present

## 2016-04-19 DIAGNOSIS — D539 Nutritional anemia, unspecified: Secondary | ICD-10-CM | POA: Diagnosis present

## 2016-04-19 DIAGNOSIS — R162 Hepatomegaly with splenomegaly, not elsewhere classified: Secondary | ICD-10-CM | POA: Diagnosis present

## 2016-04-19 DIAGNOSIS — K7011 Alcoholic hepatitis with ascites: Secondary | ICD-10-CM | POA: Diagnosis present

## 2016-04-19 DIAGNOSIS — E876 Hypokalemia: Secondary | ICD-10-CM | POA: Diagnosis present

## 2016-04-19 DIAGNOSIS — F10229 Alcohol dependence with intoxication, unspecified: Secondary | ICD-10-CM | POA: Diagnosis present

## 2016-04-19 DIAGNOSIS — M109 Gout, unspecified: Secondary | ICD-10-CM | POA: Diagnosis present

## 2016-04-19 DIAGNOSIS — Y906 Blood alcohol level of 120-199 mg/100 ml: Secondary | ICD-10-CM | POA: Diagnosis present

## 2016-04-19 DIAGNOSIS — I10 Essential (primary) hypertension: Secondary | ICD-10-CM | POA: Diagnosis present

## 2016-04-19 DIAGNOSIS — R778 Other specified abnormalities of plasma proteins: Secondary | ICD-10-CM | POA: Diagnosis present

## 2016-04-19 DIAGNOSIS — R945 Abnormal results of liver function studies: Secondary | ICD-10-CM | POA: Diagnosis present

## 2016-04-19 DIAGNOSIS — F419 Anxiety disorder, unspecified: Secondary | ICD-10-CM | POA: Diagnosis present

## 2016-04-19 DIAGNOSIS — E7220 Disorder of urea cycle metabolism, unspecified: Secondary | ICD-10-CM | POA: Diagnosis present

## 2016-04-19 DIAGNOSIS — K7040 Alcoholic hepatic failure without coma: Secondary | ICD-10-CM | POA: Diagnosis present

## 2016-04-19 DIAGNOSIS — F10120 Alcohol abuse with intoxication, uncomplicated: Secondary | ICD-10-CM | POA: Diagnosis not present

## 2016-04-19 LAB — COMPREHENSIVE METABOLIC PANEL
ALBUMIN: 2.3 g/dL — AB (ref 3.5–5.0)
ALT: 53 U/L (ref 14–54)
AST: 234 U/L — AB (ref 15–41)
Alkaline Phosphatase: 276 U/L — ABNORMAL HIGH (ref 38–126)
Anion gap: 15 (ref 5–15)
CHLORIDE: 102 mmol/L (ref 101–111)
CO2: 19 mmol/L — ABNORMAL LOW (ref 22–32)
Calcium: 6.9 mg/dL — ABNORMAL LOW (ref 8.9–10.3)
Creatinine, Ser: <0.3 mg/dL — ABNORMAL LOW (ref 0.44–1.00)
GLUCOSE: 121 mg/dL — AB (ref 65–99)
POTASSIUM: 3 mmol/L — AB (ref 3.5–5.1)
SODIUM: 136 mmol/L (ref 135–145)
Total Bilirubin: 12.6 mg/dL — ABNORMAL HIGH (ref 0.3–1.2)
Total Protein: 6.8 g/dL (ref 6.5–8.1)

## 2016-04-19 LAB — MRSA PCR SCREENING: MRSA by PCR: NEGATIVE

## 2016-04-19 LAB — ECHOCARDIOGRAM COMPLETE
AOASC: 31 cm
AOPV: 0.78 m/s
AVAREAVTI: 2.45 cm2
AVLVOTPG: 9 mmHg
AVPG: 15 mmHg
AVPKVEL: 195 cm/s
CHL CUP AV PEAK INDEX: 1.33
CHL CUP MV DEC (S): 187
CHL CUP STROKE VOLUME: 67 mL
E decel time: 187 msec
EERAT: 9.91
FS: 44 % (ref 28–44)
Height: 64 in
IV/PV OW: 1.01
LA vol: 58.3 mL
LADIAMINDEX: 1.96 cm/m2
LASIZE: 36 mm
LAVOLA4C: 49.4 mL
LAVOLIN: 31.7 mL/m2
LEFT ATRIUM END SYS DIAM: 36 mm
LV E/e' medial: 9.91
LV PW d: 10.5 mm — AB (ref 0.6–1.1)
LV SIMPSON'S DISK: 72
LV TDI E'MEDIAL: 11.6
LV dias vol index: 51 mL/m2
LV dias vol: 94 mL (ref 46–106)
LV e' LATERAL: 11.1 cm/s
LV sys vol index: 15 mL/m2
LV sys vol: 27 mL (ref 14–42)
LVEEAVG: 9.91
LVOT area: 3.14 cm2
LVOT peak vel: 152 cm/s
LVOTD: 20 mm
MVPG: 5 mmHg
MVPKAVEL: 133 m/s
MVPKEVEL: 110 m/s
RV TAPSE: 21.7 mm
TDI e' lateral: 11.1
Weight: 2765.45 oz

## 2016-04-19 LAB — TROPONIN I
TROPONIN I: 0.03 ng/mL — AB (ref <0.03)
TROPONIN I: 0.03 ng/mL — AB (ref <0.03)
Troponin I: <0.03 ng/mL (ref <0.03)

## 2016-04-19 LAB — CBC
HEMATOCRIT: 24.6 % — AB (ref 36.0–46.0)
Hemoglobin: 8.2 g/dL — ABNORMAL LOW (ref 12.0–15.0)
MCH: 39.8 pg — ABNORMAL HIGH (ref 26.0–34.0)
MCHC: 33.3 g/dL (ref 30.0–36.0)
MCV: 119.4 fL — ABNORMAL HIGH (ref 78.0–100.0)
Platelets: 191 10*3/uL (ref 150–400)
RBC: 2.06 MIL/uL — ABNORMAL LOW (ref 3.87–5.11)
RDW: 22.5 % — AB (ref 11.5–15.5)
WBC: 9.8 10*3/uL (ref 4.0–10.5)

## 2016-04-19 LAB — I-STAT CG4 LACTIC ACID, ED: Lactic Acid, Venous: 5.55 mmol/L (ref 0.5–1.9)

## 2016-04-19 LAB — TSH: TSH: 0.977 u[IU]/mL (ref 0.350–4.500)

## 2016-04-19 MED ORDER — FOLIC ACID 1 MG PO TABS
1.0000 mg | ORAL_TABLET | Freq: Every day | ORAL | Status: DC
  Administered 2016-04-19 – 2016-04-23 (×5): 1 mg via ORAL
  Filled 2016-04-19 (×5): qty 1

## 2016-04-19 MED ORDER — SODIUM CHLORIDE 0.9 % IV SOLN
80.0000 mg | Freq: Once | INTRAVENOUS | Status: AC
  Administered 2016-04-19: 80 mg via INTRAVENOUS
  Filled 2016-04-19: qty 80

## 2016-04-19 MED ORDER — VITAMINS A & D EX OINT
TOPICAL_OINTMENT | CUTANEOUS | Status: AC
  Administered 2016-04-19: 06:00:00
  Filled 2016-04-19: qty 5

## 2016-04-19 MED ORDER — ALLOPURINOL 100 MG PO TABS
100.0000 mg | ORAL_TABLET | Freq: Every day | ORAL | Status: DC
  Administered 2016-04-19 – 2016-04-25 (×7): 100 mg via ORAL
  Filled 2016-04-19 (×7): qty 1

## 2016-04-19 MED ORDER — ONDANSETRON HCL 4 MG/2ML IJ SOLN: 4.0000 mg | Freq: Four times a day (QID) | INTRAMUSCULAR | Status: DC | PRN

## 2016-04-19 MED ORDER — OXYCODONE HCL 5 MG PO TABS
5.0000 mg | ORAL_TABLET | ORAL | Status: DC | PRN
  Administered 2016-04-19 – 2016-04-25 (×22): 5 mg via ORAL
  Filled 2016-04-19 (×23): qty 1

## 2016-04-19 MED ORDER — VITAMIN B-1 100 MG PO TABS
100.0000 mg | ORAL_TABLET | Freq: Every day | ORAL | Status: DC
  Administered 2016-04-19 – 2016-04-25 (×7): 100 mg via ORAL
  Filled 2016-04-19 (×7): qty 1

## 2016-04-19 MED ORDER — SODIUM CHLORIDE 0.9% FLUSH
3.0000 mL | Freq: Two times a day (BID) | INTRAVENOUS | Status: DC
  Administered 2016-04-19 – 2016-04-25 (×10): 3 mL via INTRAVENOUS

## 2016-04-19 MED ORDER — THIAMINE HCL 100 MG/ML IJ SOLN: 100.0000 mg | Freq: Every day | INTRAMUSCULAR | Status: DC

## 2016-04-19 MED ORDER — METOPROLOL TARTRATE 25 MG PO TABS
25.0000 mg | ORAL_TABLET | Freq: Two times a day (BID) | ORAL | Status: DC
  Administered 2016-04-19 – 2016-04-23 (×11): 25 mg via ORAL
  Filled 2016-04-19 (×12): qty 1

## 2016-04-19 MED ORDER — FUROSEMIDE 40 MG PO TABS
40.0000 mg | ORAL_TABLET | Freq: Every day | ORAL | Status: DC
  Administered 2016-04-19 – 2016-04-23 (×5): 40 mg via ORAL
  Filled 2016-04-19 (×6): qty 1

## 2016-04-19 MED ORDER — THIAMINE HCL 100 MG/ML IJ SOLN
Freq: Once | INTRAVENOUS | Status: AC
  Administered 2016-04-19: 05:00:00 via INTRAVENOUS
  Filled 2016-04-19: qty 1000

## 2016-04-19 MED ORDER — SODIUM CHLORIDE 0.9 % IV SOLN
Freq: Once | INTRAVENOUS | Status: AC
  Administered 2016-04-19: 250 mL/h via INTRAVENOUS

## 2016-04-19 MED ORDER — SODIUM CHLORIDE 0.9 % IV BOLUS (SEPSIS)
1000.0000 mL | Freq: Once | INTRAVENOUS | Status: AC
  Administered 2016-04-19: 1000 mL via INTRAVENOUS

## 2016-04-19 MED ORDER — PROMETHAZINE HCL 25 MG/ML IJ SOLN
6.2500 mg | Freq: Four times a day (QID) | INTRAMUSCULAR | Status: DC | PRN
  Filled 2016-04-19: qty 1

## 2016-04-19 MED ORDER — SODIUM CHLORIDE 0.9 % IV SOLN
8.0000 mg/h | INTRAVENOUS | Status: DC
  Administered 2016-04-19 – 2016-04-21 (×6): 8 mg/h via INTRAVENOUS
  Filled 2016-04-19 (×10): qty 80

## 2016-04-19 MED ORDER — LORAZEPAM 2 MG/ML IJ SOLN
1.0000 mg | Freq: Once | INTRAMUSCULAR | Status: AC
  Administered 2016-04-19: 1 mg via INTRAVENOUS
  Filled 2016-04-19: qty 1

## 2016-04-19 MED ORDER — ADULT MULTIVITAMIN W/MINERALS CH
1.0000 | ORAL_TABLET | Freq: Every day | ORAL | Status: DC
  Administered 2016-04-19 – 2016-04-25 (×7): 1 via ORAL
  Filled 2016-04-19 (×7): qty 1

## 2016-04-19 MED ORDER — LORAZEPAM 2 MG/ML IJ SOLN
2.0000 mg | INTRAMUSCULAR | Status: DC | PRN
  Administered 2016-04-20 – 2016-04-21 (×2): 2 mg via INTRAVENOUS
  Filled 2016-04-19 (×2): qty 1

## 2016-04-19 MED ORDER — LORAZEPAM 2 MG/ML IJ SOLN
1.0000 mg | INTRAMUSCULAR | Status: DC | PRN
  Administered 2016-04-19 – 2016-04-23 (×8): 1 mg via INTRAVENOUS
  Filled 2016-04-19 (×8): qty 1

## 2016-04-19 MED ORDER — ONDANSETRON HCL 4 MG PO TABS: 4.0000 mg | ORAL_TABLET | Freq: Four times a day (QID) | ORAL | Status: DC | PRN

## 2016-04-19 MED ORDER — PRO-STAT SUGAR FREE PO LIQD
30.0000 mL | Freq: Two times a day (BID) | ORAL | Status: DC
  Administered 2016-04-19 – 2016-04-20 (×3): 30 mL via ORAL
  Filled 2016-04-19 (×5): qty 30

## 2016-04-19 MED ORDER — LORATADINE 10 MG PO TABS
10.0000 mg | ORAL_TABLET | Freq: Every day | ORAL | Status: DC
  Administered 2016-04-19 – 2016-04-25 (×7): 10 mg via ORAL
  Filled 2016-04-19 (×7): qty 1

## 2016-04-19 MED ORDER — DIPHENHYDRAMINE HCL 25 MG PO CAPS: 25.0000 mg | ORAL_CAPSULE | Freq: Four times a day (QID) | ORAL | Status: DC | PRN

## 2016-04-19 MED ORDER — POTASSIUM CHLORIDE 10 MEQ/100ML IV SOLN
10.0000 meq | INTRAVENOUS | Status: AC
  Administered 2016-04-19 (×3): 10 meq via INTRAVENOUS
  Filled 2016-04-19 (×3): qty 100

## 2016-04-19 NOTE — H&P (Addendum)
[facility] H&P   Patient Demographics:    Christine [name], is a 55 y.o. female  MRN: [id number]   DOB - 1961/05/15  Admit Date - 04/18/2016  Outpatient Primary MD for the patient is Barbette Merino, MD  Referring MD/NP/PA: Tanna Furry  Outpatient Specialists: Juanita Craver (GI)  Patient coming from: home  Chief Complaint  Patient presents with  . abd distention  . Fatigue      HPI:    Christine [name]  is a 55 y.o. female, w ETOH depedence, apparently felt bad and therefore presented to the hospital.  Pt has fallen about 3-5 times in the past couple of weeks. Has difficulty with her balance.  Her husband was unable to take care of her at home.   In ED, her liver function was found to be abnormal  Ast 248 Alt 54 Alk 279 T. Bili 10.6 Ammonia 43 (high) CT scan showed evidence of cirrhosis.    Pt also noted to be hypokalemic potassium of 3.1 and anemic with Hgb 8.1.  Pt will be admitted for alcoholic cirrhosis, hypokalemia and anemia.       Review of systems:    In addition to the HPI above,  No Fever-chills, No Headache, No changes with Vision or hearing, No problems swallowing food or Liquids, No Chest pain, Cough or Shortness of Breath, No Abdominal pain,  Bowel movements are regular, No Blood in stool or Urine, No dysuria, No new skin rashes or bruises, No new joints pains-aches,  No new weakness, tingling, numbness in any extremity, No recent weight gain or loss, No polyuria, polydypsia or polyphagia, No significant Mental Stressors.    A full 10 point Review of Systems was done, except as stated above, all other Review of Systems were negative.   With Past History of the following :    Past Medical History:  Diagnosis Date  . Abnormal Pap smear    displasia had colposcopy  . Anemia   . Arthritis   . Closed right humeral fracture   . Depression    not currently  on meds  . GERD (gastroesophageal reflux disease)   . Gout   . Hernia   . Hypertension   . Psoriasis       Past Surgical History:  Procedure Laterality Date  . COLPOSCOPY    . DILATION AND CURETTAGE OF UTERUS        Social History:     Social History  Substance Use Topics  . Smoking status: Current Every Day Smoker    Packs/day: 1.00    Years: 15.00    Types: Cigarettes  . Smokeless tobacco: Never Used  . Alcohol use Yes     Comment: couple glasses liquor day     Lives - at home  Mobility -   Unable to walk   Family History :     Family History  Problem Relation Age of Onset  . Hypertension Mother   . Diabetes Mother   . Hypertension Father   . Diabetes Brother       Home Medications:   Prior to Admission medications   Medication Sig Start Date End Date Taking? Authorizing Provider  allopurinol (ZYLOPRIM) 100 MG tablet Take 100 mg by mouth daily.   Yes Historical Provider, MD  ALPRAZolam Duanne Moron) 0.5 MG tablet Take 0.5 mg by mouth 3 (three) times daily as needed for anxiety.    Yes Historical Provider, MD  furosemide (LASIX) 40 MG tablet Take 40 mg by mouth daily.   Yes Historical Provider, MD  HYDROcodone-acetaminophen (NORCO) 7.5-325 MG tablet Take 1 tablet by mouth every 6 (six) hours as needed for moderate pain or severe pain.   Yes Historical Provider, MD  metoprolol tartrate (LOPRESSOR) 25 MG tablet Take 25 mg by mouth 2 (two) times daily.  04/01/16  Yes Historical Provider, MD  zolpidem (AMBIEN) 10 MG tablet Take 10 mg by mouth at bedtime as needed for sleep.    Yes Historical Provider, MD  cetirizine (ZYRTEC) 10 MG tablet Take 10 mg by mouth as needed. Used nasal congestion.    Historical Provider, MD  colchicine 0.6 MG tablet Take 1 tablet (0.6 mg total) by mouth 2 (two) times daily. Patient not taking: Reported on 04/18/2016 01/23/15   Jaynee Eagles, PA-C  diphenhydrAMINE (BENADRYL) 25 MG tablet Take 25 mg by mouth every 6 (six) hours as needed.     Historical Provider, MD  doxycycline (VIBRA-TABS) 100 MG tablet Take 1 tablet (100 mg total) by mouth 2 (two) times daily. Patient not taking: Reported on 04/18/2016 12/27/14   Wendie Agreste, MD  oxyCODONE-acetaminophen (ROXICET) 5-325 MG per tablet Take 1 tablet by mouth every 4 (four) hours as needed for severe pain. Patient not taking: Reported on 04/18/2016 03/11/15   Roselee Culver, MD     Allergies:     Allergies  Allergen Reactions  . Sulfa Antibiotics Rash    Causes nausea.     Physical Exam:   Vitals  Blood pressure (!) 107/52, pulse 119, temperature 98.6 F (37 C), temperature source Oral, resp. rate 21, height '5\' 4"'$  (1.626 m), weight 77.1 kg (170 lb), SpO2 97 %.   1. General  lying in bed in NAD,    2. Normal affect and insight, Not Suicidal or Homicidal, Awake Alert, Oriented X 3.  3. No F.N deficits, ALL C.Nerves Intact, Strength 5/5 all 4 extremities, Sensation intact all 4 extremities, Plantars down going.  4. Ears and Eyes appear Normal, + icterus  PERRLA. Moist Oral Mucosa.    5. Supple Neck, No JVD, No cervical lymphadenopathy appriciated, No Carotid Bruits.  6. Symmetrical Chest wall movement, Good air movement bilaterally, CTAB.  7. RRR, No Gallops, Rubs or Murmurs, No Parasternal Heave.  8. Positive Bowel Sounds, Abdomen Soft, No tenderness, No organomegaly appriciated,No rebound -guarding or rigidity.  9.  No Cyanosis, Normal Skin Turgor, No Skin Rash or Bruise.  10. Good muscle tone,  joints appear normal , no effusions, Normal ROM.  11. No Palpable Lymph Nodes in Neck or Axillae  No asterixis, no palmar erythema, no spider   Data Review:    CBC  Recent Labs Lab 04/18/16 1650  WBC 8.0  HGB 8.4*  HCT 25.4*  PLT 203  MCV 118.7*  MCH 39.3*  MCHC 33.1  RDW 22.7*  LYMPHSABS 1.0  MONOABS 0.8  EOSABS 0.0  BASOSABS 0.0    ------------------------------------------------------------------------------------------------------------------  Chemistries   Recent Labs Lab 04/18/16 1650  NA 135  K 3.1*  CL 99*  CO2 19*  GLUCOSE 108*  BUN <5*  CREATININE <0.30*  CALCIUM 7.3*  AST 249*  ALT 54  ALKPHOS 279*  BILITOT 10.6*   ------------------------------------------------------------------------------------------------------------------ CrCl cannot be calculated (This lab value cannot be used to calculate CrCl because it is not a number: <0.30). ------------------------------------------------------------------------------------------------------------------ No results for input(s): TSH, T4TOTAL, T3FREE, THYROIDAB in the last 72 hours.  Invalid input(s): FREET3  Coagulation profile  Recent Labs Lab 04/18/16 1650  INR 1.23   ------------------------------------------------------------------------------------------------------------------- No results for input(s): DDIMER in the last 72 hours. -------------------------------------------------------------------------------------------------------------------  Cardiac Enzymes No results for input(s): CKMB, TROPONINI, MYOGLOBIN in the last 168 hours.  Invalid input(s): CK ------------------------------------------------------------------------------------------------------------------    Component Value Date/Time   BNP 118.3 (H) 04/18/2016 1659     ---------------------------------------------------------------------------------------------------------------  Urinalysis    Component Value Date/Time   COLORURINE ORANGE (A) 04/18/2016 1629   APPEARANCEUR CLOUDY (A) 04/18/2016 1629   LABSPEC 1.012 04/18/2016 1629   PHURINE 6.0 04/18/2016 1629   GLUCOSEU NEGATIVE 04/18/2016 1629   HGBUR NEGATIVE 04/18/2016 1629   BILIRUBINUR LARGE (A) 04/18/2016 1629   BILIRUBINUR small 12/27/2011 1031   KETONESUR 15 (A) 04/18/2016 1629   PROTEINUR  NEGATIVE 04/18/2016 1629   UROBILINOGEN 0.2 12/27/2011 1031   NITRITE POSITIVE (A) 04/18/2016 1629   LEUKOCYTESUR TRACE (A) 04/18/2016 1629    ----------------------------------------------------------------------------------------------------------------   Imaging Results:    Ct Abdomen Pelvis W Contrast  Result Date: 04/18/2016 CLINICAL DATA:  Patient with 3 weeks of abdominal distention and generalized fatigue. Jaundice. Patient with history of alcoholism. EXAM: CT ABDOMEN AND PELVIS WITH CONTRAST TECHNIQUE: Multidetector CT imaging of the abdomen and pelvis was performed using the standard protocol following bolus administration of intravenous contrast. CONTRAST:  45m ISOVUE-300 IOPAMIDOL (ISOVUE-300) INJECTION 61%, 1060mISOVUE-300 IOPAMIDOL (ISOVUE-300) INJECTION 61% COMPARISON:  None. FINDINGS: Lower chest: Normal heart size. Minimal atelectasis and/or scarring within the left lower lobe and lingula. Small hiatal hernia. Hepatobiliary: The liver is markedly enlarged. The liver is diffusely low in attenuation. There is heterogeneous parenchymal enhancement throughout the right hepatic lobe and hepatic dome. Small amount of perihepatic fluid predominately about the right hepatic lobe measuring up to 17 mm in thickness (image 32; series 2). Gallbladder is mildly distended. Pancreas: Unremarkable Spleen: Enlarged measuring 16 cm. Adrenals/Urinary Tract: The adrenal glands are normal. Kidneys enhance symmetrically with contrast. No hydronephrosis. Urinary bladder is unremarkable. Stomach/Bowel: No evidence for bowel obstruction. Descending and sigmoid colonic diverticulosis. There is wall thickening of multiple loops of small bowel within the central abdomen. Additionally there is suggestion of wall thickening involving the cecum, ascending and proximal transverse colon. Vascular/Lymphatic: Normal caliber abdominal aorta. No retroperitoneal lymphadenopathy. Peripheral calcified atherosclerotic plaque.  Re- cannulization of the periumbilical vein. Reproductive: Uterus and adnexal structures are unremarkable. Other: Small amount of free fluid within the pelvis and paracolic gutters. Musculoskeletal: Lumbar spine degenerative changes. No aggressive or acute appearing osseous lesions. IMPRESSION: The liver is markedly enlarged and decreased in attenuation most compatible with steatosis and/or associated hepatic edema. There is contour abnormality predominately involving the right hepatic lobe with left hepatic lobe hypertrophy suggestive of cirrhosis. Patchy enhancement throughout the right hepatic lobe is nonspecific and may be secondary to acute hepatitis or perfusional anomaly. Consider dedicated evaluation of the liver in the non acute setting with pre and post contrast-enhanced MRI to exclude the possibility of underlying hepatic lesion. Small amount of perihepatic fluid as well  as fluid within the paracolic gutters and pelvis. Nonspecific wall thickening of the small bowel within the central abdomen as well as the proximal colon. These findings may be secondary to portal venous hypertension however acute enteritis is not excluded. Splenomegaly. Electronically Signed   By: Lovey Newcomer M.D.   On: 04/18/2016 19:14       Assessment & Plan:    Active Problems:   Acute hepatic failure   Anemia   Hypokalemia   Hyperglycemia   Cirrhosis (HCC)   Psoriasis   Tachycardia   Alcohol abuse   Nausea    1. N/v while in ED  ? Slight bloody emesis , tinge ? Mallory Weiss tear vs eosphagitis vs gastritis vs PUD Zofran '4mg'$  iv q6h prn Protonix '80mg'$  iv x 1 then '8mg'$  / hour.  NPO Please call GI consult in am  2. Alcoholic cirrhosis (child pugh class B, Meld =19) Please abstain from etoh use,  Spent 5 minutes on counselling patient Observe for signs of etoh withdrawal   3. Tachycardia Trop I q6h x3 Check tsh Check cardiac echo  4. Hypokalemia Replete with Kcl 10 meq in 100 mL ns iv x3  5.   Hyperglycemia Check hga1c  6. Anemia Check cbc in am Check esr  7.  Depression  After loosing job at [employer]   8.  ETOH abuse Thiamine, folate, b12 CIWA  DVT Prophylaxis - SCDs   AM Labs Ordered, also please review Full Orders  Family Communication: Admission, patients condition and plan of care including tests being ordered have been discussed with the patient  who indicate understanding and agree with the plan and Code Status.  Code Status FULL CODE  Likely DC to  home  Condition GUARDED    Consults called: please call GI in am  Admission status: inpatient  Time spent in minutes : 45 minutes   Jani Gravel M.D on 04/19/2016 at 1:48 AM  Between 7am to 7pm - Pager - [phone]  After 7pm go to [email or web address] - password [facility]  [facility] - Office  [phone]

## 2016-04-19 NOTE — Progress Notes (Signed)
CRITICAL VALUE ALERT  Critical value received:  troponin  Date of notification:  04/19/2016  Time of notification:  0445 am  Critical value read back: y  Nurse who received alert:  Conley RollsGarland, Kinley Ferrentino Lavern, RN  MD notified (1st page):  [facility], NP  Time of first page:  0503 am  MD notified (2nd page):  Time of second page:  Responding MD:  [facility], NP  Time MD responded:  0506 am

## 2016-04-19 NOTE — ED Provider Notes (Signed)
Pt evaluated by Dr. Clydene PughKnott.  Discussed with Hospitalist.  Agreement made to review Ct scan results before admit to r/o surgical condition.  Ct with Hepatomegaly and edema c/w cirrhosis.  Lactate originally 6.6, pt given 24oocc NS.  Recheck 5.5.  Pt receiving additional iVF.  Lactate may not clear as expected 2/2 liver failure.  Pt not febrile, no Leukocytosis.   Will discuss with Hospitalist.   Christine PorterMark Neasia Fleeman, MD 04/19/16 [phone]

## 2016-04-19 NOTE — Progress Notes (Addendum)
Patient ID: Christine [name], female   DOB: 1961/07/01, 55 y.o.   MRN: [id number]   Patient admitted after midnight. For details please refer to admission note done 04/19/2016.  55 y.o. female with past medical history of alcohol abuse and alcohol dependence, history of recent falls in last couple of weeks. She presented to ED for further evaluation. Her husband at the bedside reports he is unable to take care of her at home at this point. Patient was hemodynamically stable on the admission. Her blood work was notable for hemoglobin of 8.4, potassium 3.1, lactic acid 6.7 and mild elevation in troponin 0.03. Liver function enzymes were as follows: AST was 249, ALT 54, ALP 279 and bilirubin 10.6. Ammonia level was 43. Alcohol level was 132. CT scan showed evidence of cirrhosis.    Assessment and Plan  Acute alcohol intoxication without delirium - Alcohol level 132 on the admission - CIWA protocol initiated  - Continue to monitor in SDU for next 24 hours   Alcoholic liver cirrhosis - Evidence of liver cirrhosis on CT scan - Obtain abd US to evaluate ascites   Manson Passeylma Devine [facility] [phone]

## 2016-04-19 NOTE — ED Notes (Signed)
Report called to 2W

## 2016-04-20 LAB — CBC
HEMATOCRIT: 23.2 % — AB (ref 36.0–46.0)
HEMOGLOBIN: 7.5 g/dL — AB (ref 12.0–15.0)
MCH: 39.5 pg — ABNORMAL HIGH (ref 26.0–34.0)
MCHC: 32.3 g/dL (ref 30.0–36.0)
MCV: 122.1 fL — ABNORMAL HIGH (ref 78.0–100.0)
Platelets: 175 10*3/uL (ref 150–400)
RBC: 1.9 MIL/uL — AB (ref 3.87–5.11)
RDW: 22.1 % — ABNORMAL HIGH (ref 11.5–15.5)
WBC: 9.6 10*3/uL (ref 4.0–10.5)

## 2016-04-20 LAB — COMPREHENSIVE METABOLIC PANEL
ALK PHOS: 227 U/L — AB (ref 38–126)
ALT: 43 U/L (ref 14–54)
AST: 183 U/L — AB (ref 15–41)
Albumin: 2.1 g/dL — ABNORMAL LOW (ref 3.5–5.0)
Anion gap: 11 (ref 5–15)
BUN: <5 mg/dL — ABNORMAL LOW (ref 6–20)
CO2: 23 mmol/L (ref 22–32)
Calcium: 6.8 mg/dL — ABNORMAL LOW (ref 8.9–10.3)
Chloride: 102 mmol/L (ref 101–111)
Creatinine, Ser: <0.3 mg/dL — ABNORMAL LOW (ref 0.44–1.00)
GLUCOSE: 108 mg/dL — AB (ref 65–99)
Potassium: 2.9 mmol/L — ABNORMAL LOW (ref 3.5–5.1)
SODIUM: 136 mmol/L (ref 135–145)
TOTAL PROTEIN: 6.4 g/dL — AB (ref 6.5–8.1)
Total Bilirubin: 13 mg/dL — ABNORMAL HIGH (ref 0.3–1.2)

## 2016-04-20 LAB — PREPARE RBC (CROSSMATCH)

## 2016-04-20 LAB — ABO/RH: ABO/RH(D): O POS

## 2016-04-20 MED ORDER — SODIUM CHLORIDE 0.9 % IV SOLN
Freq: Once | INTRAVENOUS | Status: AC
  Administered 2016-04-20: 09:00:00 via INTRAVENOUS

## 2016-04-20 MED ORDER — POTASSIUM CHLORIDE CRYS ER 20 MEQ PO TBCR
40.0000 meq | EXTENDED_RELEASE_TABLET | Freq: Once | ORAL | Status: AC
  Administered 2016-04-20: 40 meq via ORAL
  Filled 2016-04-20: qty 2

## 2016-04-20 MED ORDER — ACETAMINOPHEN 325 MG PO TABS
325.0000 mg | ORAL_TABLET | Freq: Once | ORAL | Status: AC
  Administered 2016-04-20: 325 mg via ORAL
  Filled 2016-04-20: qty 1

## 2016-04-20 MED ORDER — LACTULOSE 10 GM/15ML PO SOLN
10.0000 g | Freq: Two times a day (BID) | ORAL | Status: DC
  Administered 2016-04-20 – 2016-04-21 (×3): 10 g via ORAL
  Filled 2016-04-20 (×3): qty 15

## 2016-04-20 NOTE — Progress Notes (Signed)
Patient received from the ICU, vital signs stable, skin WDI, alert & oriented. Will continue to monitor.

## 2016-04-20 NOTE — Progress Notes (Signed)
Patient ID: Christine [name], female   DOB: 04/02/1961, 55 y.o.   MRN: [id number]   PROGRESS NOTE    Christine [name]  WUJ:[id number] DOB: 02/27/1961 DOA: 04/18/2016  PCP: [name], MD   Brief Narrative:  55 y.o.female with past medical history of alcohol abuse and alcohol dependence, history of recent falls in last couple of weeks. She presented to ED for further evaluation. Her husband at the bedside reports he is unable to take care of her at home at this point. Patient was hemodynamically stable on the admission. Her blood work was notable for hemoglobin of 8.4, potassium 3.1, lactic acid 6.7 and mild elevation in troponin 0.03. Liver function enzymes were as follows: AST was 249, ALT 54, ALP 279 and bilirubin 10.6. Ammonia level was 43. Alcohol level was 132. CT scan showed evidence of cirrhosis.  Assessment & Plan:  Acute alcohol intoxication without delirium - Alcohol level 132 on the admission - CIWA protocol initiated  - Stable at this time, transfer to medical floor today   Alcoholic liver cirrhosis / Abnormal LFT's - Evidence of liver cirrhosis on CT scan - [place] with hepatic steatosis but no significant ascites - Trend LFT's - May need GI consult - Will add low dose lactulose   Anemia of chronic disease - Due to bone marrow suppression from liver cirrhosis - Hemoglobin 7.5 today so will give 1 U PRBC transfusion today   Hypokalemia - Due to poor po intake - Will add potassium supplementation today   Mild troponin elevation - Demand ischemia from liver cirrhosis, hypokalemia - Repeat troponin WNL - No reports of chest pain  - 2 D ECHO with normal EF  DVT prophylaxis: Use SCD's bilaterally  Code Status: full code  Family Communication: husband at the bedside this am Disposition Plan: home once stable, transfer to medical floor today    Consultants:   None   Procedures:   None   Antimicrobials:   None    Subjective: No overnight events.    Objective: Vitals:   04/20/16 0922 04/20/16 1000 04/20/16 1124 04/20/16 1146  BP: 104/60 128/65 93/69 106/66  Pulse: (!) 111 (!) 117 (!) 104 (!) 109  Resp:  18 (!) 26 (!) 23  Temp:    99.7 F (37.6 C)  TempSrc:    Oral  SpO2:  93% 91% 95%  Weight:      Height:        Intake/Output Summary (Last 24 hours) at 04/20/16 1340 Last data filed at 04/20/16 1225  Gross per 24 hour  Intake             1740 ml  Output             1025 ml  Net              715 ml   Filed Weights   04/18/16 1606 04/19/16 0331  Weight: 77.1 kg (170 lb) 78.4 kg (172 lb 13.5 oz)    Examination:  General exam: Appears calm and comfortable; jaundiced skin  Respiratory system: Clear to auscultation. Respiratory effort normal. Cardiovascular system: S1 & S2 heard, RRR. No JVD, murmurs, rubs, gallops or clicks. No pedal edema. Gastrointestinal system: Abdomen is distended and nontender. No organomegaly or masses felt. Normal bowel sounds heard. Central nervous system: Alert and oriented. No focal neurological deficits. Extremities: Symmetric 5 x 5 power. Skin: No rashes, lesions or ulcers Psychiatry: Judgement and insight appear normal. Mood & affect appropriate.   Data Reviewed:  I have personally reviewed following labs and imaging studies  CBC:  Recent Labs Lab 04/18/16 1650 04/19/16 0400 04/20/16 0322  WBC 8.0 9.8 9.6  NEUTROABS 6.3  --   --   HGB 8.4* 8.2* 7.5*  HCT 25.4* 24.6* 23.2*  MCV 118.7* 119.4* 122.1*  PLT 203 191 175   Basic Metabolic Panel:  Recent Labs Lab 04/18/16 1650 04/19/16 0400 04/20/16 0322  NA 135 136 136  K 3.1* 3.0* 2.9*  CL 99* 102 102  CO2 19* 19* 23  GLUCOSE 108* 121* 108*  BUN <5* <5* <5*  CREATININE <0.30* <0.30* <0.30*  CALCIUM 7.3* 6.9* 6.8*   GFR: CrCl cannot be calculated (This lab value cannot be used to calculate CrCl because it is not a number: <0.30). Liver Function Tests:  Recent Labs Lab 04/18/16 1650 04/19/16 0400 04/20/16 0322  AST  249* 234* 183*  ALT 54 53 43  ALKPHOS 279* 276* 227*  BILITOT 10.6* 12.6* 13.0*  PROT 6.8 6.8 6.4*  ALBUMIN 2.3* 2.3* 2.1*    Recent Labs Lab 04/18/16 1650  LIPASE 19    Recent Labs Lab 04/18/16 1659  AMMONIA 43*   Coagulation Profile:  Recent Labs Lab 04/18/16 1650  INR 1.23   Cardiac Enzymes:  Recent Labs Lab 04/19/16 0400 04/19/16 0935 04/19/16 1528  TROPONINI 0.03* 0.03* <0.03   BNP (last 3 results) No results for input(s): PROBNP in the last 8760 hours. HbA1C: No results for input(s): HGBA1C in the last 72 hours. CBG: No results for input(s): GLUCAP in the last 168 hours. Lipid Profile: No results for input(s): CHOL, HDL, LDLCALC, TRIG, CHOLHDL, LDLDIRECT in the last 72 hours. Thyroid Function Tests:  Recent Labs  04/19/16 0400  TSH 0.977   Anemia Panel:  Recent Labs  04/18/16 1650 04/18/16 1706 04/18/16 1850  VITAMINB12  --  426  --   FOLATE 2.4*  --   --   FERRITIN  --  2,700*  --   TIBC  --  102*  --   IRON  --  100  --   RETICCTPCT  --   --  6.8*   Urine analysis:    Component Value Date/Time   COLORURINE ORANGE (A) 04/18/2016 1629   APPEARANCEUR CLOUDY (A) 04/18/2016 1629   LABSPEC 1.012 04/18/2016 1629   PHURINE 6.0 04/18/2016 1629   GLUCOSEU NEGATIVE 04/18/2016 1629   HGBUR NEGATIVE 04/18/2016 1629   BILIRUBINUR LARGE (A) 04/18/2016 1629   BILIRUBINUR small 12/27/2011 1031   KETONESUR 15 (A) 04/18/2016 1629   PROTEINUR NEGATIVE 04/18/2016 1629   UROBILINOGEN 0.2 12/27/2011 1031   NITRITE POSITIVE (A) 04/18/2016 1629   LEUKOCYTESUR TRACE (A) 04/18/2016 1629   Sepsis Labs: @LABRCNTIP (procalcitonin:4,lacticidven:4)   ) Recent Results (from the past 240 hour(s))  MRSA PCR Screening     Status: None   Collection Time: 04/19/16  3:30 AM  Result Value Ref Range Status   MRSA by PCR NEGATIVE NEGATIVE Final      Radiology Studies: US Abdomen Complete Result Date: 04/19/2016 1. Hepatomegaly with extensive hepatic  steatosis. 2. Mild splenomegaly. 3. Small amount of ascites. 4. No acute findings. Normal gallbladder. No bile duct dilation. Pancreas not visualized. Electronically Signed   By: Amie Portland M.D.   On: 04/19/2016 12:23   Ct Abdomen Pelvis W Contrast Result Date: 04/18/2016 The liver is markedly enlarged and decreased in attenuation most compatible with steatosis and/or associated hepatic edema. There is contour abnormality predominately involving the right hepatic lobe with  left hepatic lobe hypertrophy suggestive of cirrhosis. Patchy enhancement throughout the right hepatic lobe is nonspecific and may be secondary to acute hepatitis or perfusional anomaly. Consider dedicated evaluation of the liver in the non acute setting with pre and post contrast-enhanced MRI to exclude the possibility of underlying hepatic lesion. Small amount of perihepatic fluid as well as fluid within the paracolic gutters and pelvis. Nonspecific wall thickening of the small bowel within the central abdomen as well as the proximal colon. These findings may be secondary to portal venous hypertension however acute enteritis is not excluded. Splenomegaly. Electronically Signed   By: Annia Beltrew  Davis M.D.   On: 04/18/2016 19:14     Scheduled Meds: . allopurinol  100 mg Oral Daily  . feeding supplement (PRO-STAT SUGAR FREE 64)  30 mL Oral BID  . folic acid  1 mg Oral Daily  . furosemide  40 mg Oral Daily  . lactulose  10 g Oral BID  . loratadine  10 mg Oral Daily  . metoprolol tartrate  25 mg Oral BID  . multivitamin with minerals  1 tablet Oral Daily  . sodium chloride flush  3 mL Intravenous Q12H  . thiamine  100 mg Intravenous Daily  . thiamine  100 mg Oral Daily   Continuous Infusions: . pantoprozole (PROTONIX) infusion 8 mg/hr (04/20/16 1100)     LOS: 1 day    Time spent: 25 minutes  Greater than 50% of the time spent on counseling and coordinating the care.   Manson PasseyEVINE, Margarette Vannatter, MD [facility] Pager  [phone]  If 7PM-7AM, please contact night-coverage [email or web address] Password [email or web address] 04/20/2016, 1:40 PM

## 2016-04-21 LAB — CBC
HEMATOCRIT: 28.3 % — AB (ref 36.0–46.0)
HEMOGLOBIN: 9.3 g/dL — AB (ref 12.0–15.0)
MCH: 37.2 pg — AB (ref 26.0–34.0)
MCHC: 32.9 g/dL (ref 30.0–36.0)
MCV: 113.2 fL — AB (ref 78.0–100.0)
PLATELETS: 192 10*3/uL (ref 150–400)
RBC: 2.5 MIL/uL — AB (ref 3.87–5.11)
WBC: 11.8 10*3/uL — ABNORMAL HIGH (ref 4.0–10.5)

## 2016-04-21 LAB — TYPE AND SCREEN
ABO/RH(D): O POS
ANTIBODY SCREEN: NEGATIVE
UNIT DIVISION: 0

## 2016-04-21 LAB — COMPREHENSIVE METABOLIC PANEL
ALK PHOS: 213 U/L — AB (ref 38–126)
ALT: 40 U/L (ref 14–54)
AST: 168 U/L — AB (ref 15–41)
Albumin: 2.2 g/dL — ABNORMAL LOW (ref 3.5–5.0)
Anion gap: 11 (ref 5–15)
BILIRUBIN TOTAL: 14.5 mg/dL — AB (ref 0.3–1.2)
BUN: 6 mg/dL (ref 6–20)
CALCIUM: 7.1 mg/dL — AB (ref 8.9–10.3)
CO2: 24 mmol/L (ref 22–32)
Chloride: 101 mmol/L (ref 101–111)
Glucose, Bld: 95 mg/dL (ref 65–99)
Potassium: 3.2 mmol/L — ABNORMAL LOW (ref 3.5–5.1)
Sodium: 136 mmol/L (ref 135–145)
TOTAL PROTEIN: 6.7 g/dL (ref 6.5–8.1)

## 2016-04-21 MED ORDER — LACTULOSE 10 GM/15ML PO SOLN
20.0000 g | Freq: Two times a day (BID) | ORAL | Status: DC
Start: 2016-04-21 — End: 2016-04-25
  Administered 2016-04-21 – 2016-04-25 (×8): 20 g via ORAL
  Filled 2016-04-21 (×9): qty 30

## 2016-04-21 MED ORDER — PRO-STAT SUGAR FREE PO LIQD
30.0000 mL | Freq: Three times a day (TID) | ORAL | Status: DC
  Administered 2016-04-21 – 2016-04-25 (×12): 30 mL via ORAL
  Filled 2016-04-21 (×12): qty 30

## 2016-04-21 NOTE — Progress Notes (Signed)
Initial Nutrition Assessment  INTERVENTION:   Provide Prostat liquid protein PO 30 ml TID with meals, each supplement provides 100 kcal, 15 grams protein. Encourage PO intake RD to continue to monitor  NUTRITION DIAGNOSIS:   Increased nutrient needs related to other (see comment) (ETOH cirrhosis) as evidenced by estimated needs.  GOAL:   Patient will meet greater than or equal to 90% of their needs  MONITOR:   PO intake, Supplement acceptance, Labs, Weight trends, I & O's  REASON FOR ASSESSMENT:   Malnutrition Screening Tool    ASSESSMENT:   55 y.o. female with past medical history of alcohol abuse and alcohol dependence, history of recent falls in last couple of weeks.   Patient in room with husband at bedside. Pt reports eating a bagel with cream cheese and juice for breakfast this morning. She is eating 100% of meals at this time. Pt reports poor appetite PTA for about 2 weeks. A few days PTA she was mainly consuming cans of tomato soup. Pt's husband did buy [employer] and [employer] Instant Breakfast supplements but pt did not prefer the sweetness of them. She has been ordered Prostat supplements and is taking them with no issue. Will increase order to TID.   Pt does c/o chewing issues. Offered to provide chopped meals but pt declines stating that her husband will help with her meals if needed.  Pt reports UBW is 170 lb x 1-2 years. Weight remains stable. Nutrition focused physical exam shows no sign of depletion of muscle mass or body fat. Pt with visible ascites and jaundiced skin.  Medications: Folic Acid tablet daily, Lasix tablet daily, Multivitamin with minerals daily, Thiamine tablet daily Labs reviewed: Low K -trending up  Diet Order:  Diet regular Room service appropriate? Yes; Fluid consistency: Thin  Skin:  Reviewed, no issues (Jaundice)  Last BM:  9/17  Height:   Ht Readings from Last 1 Encounters:  04/19/16 5\' 4"  (1.626 m)    Weight:   Wt Readings from  Last 1 Encounters:  04/19/16 172 lb 13.5 oz (78.4 kg)    Ideal Body Weight:  54.5 kg  BMI:  Body mass index is 29.67 kg/m.  Estimated Nutritional Needs:   Kcal:  1950-2150  Protein:  105-115g  Fluid:  Per MD  EDUCATION NEEDS:   Education needs addressed  Tilda FrancoLindsey Beecher Furio, MS, RD, LDN Pager: [phone] After Hours Pager: [phone]

## 2016-04-21 NOTE — Evaluation (Signed)
Physical Therapy Evaluation Patient Details Name: Joaquim NamJanet M Giuffre MRN: [id number] DOB: 1960-09-21 Today's Date: 04/21/2016   History of Present Illness  Pt is a 55 year old female with past medical history of alcohol abuse/alcohol dependence and 3-5 falls in last couple of weeks admitted for worsening fatigue and distended abdomen.  Clinical Impression  Pt admitted with above. Pt currently with functional limitations due to the deficits listed below (see PT Problem List). Pt will benefit from skilled PT to increase her independence and safety with mobility to allow discharge. Pt displayed decreased tolerance to mobility due to weakness and fatigue. Pt was able to perform transfers and ambulation with min assist and bed mobility with min-mod assist. Recommending SNF due to generalized weakness, multiple falls at home, and decreased mobility. Spouse feels as though he is not able to care for her at home at this time.     Follow Up Recommendations SNF    Equipment Recommendations  None recommended by PT (pt's spouse reports having rolling walker at home)    Recommendations for Other Services       Precautions / Restrictions Precautions Precautions: Fall Restrictions Weight Bearing Restrictions: No      Mobility  Bed Mobility Overal bed mobility: Needs Assistance Bed Mobility: Supine to Sit;Sit to Supine;Rolling Rolling: Min guard   Supine to sit: Mod assist Sit to supine: Min assist   General bed mobility comments: required min assist for LEs and trunk from PT and student for supine to sit, spouse appeared to perform mod assist; required assist for LEs for sit to supine; guarding for safety with rolling  Transfers Overall transfer level: Needs assistance Equipment used: Rolling walker (2 wheeled) Transfers: Sit to/from [employer] CorporationStand;Stand Pivot Transfers Sit to Stand: Min assist Stand pivot transfers: Min assist       General transfer comment: assistance for steadying, rise, and  controlled descent  Ambulation/Gait Ambulation/Gait assistance: Min assist Ambulation Distance (Feet): 20 Feet Assistive device: Rolling walker (2 wheeled) Gait Pattern/deviations: Step-through pattern;Decreased stride length     General Gait Details: assistance for steadying; distance due to fatigue and weakness  Stairs            Wheelchair Mobility    Modified Rankin (Stroke Patients Only)       Balance Overall balance assessment: Needs assistance         Standing balance support: During functional activity;Bilateral upper extremity supported Standing balance-Leahy Scale: Poor Standing balance comment: required RW for B UE support when ambulating and during transfers                             Pertinent Vitals/Pain Pain Assessment: 0-10 Pain Score: 8  Pain Location: back and B hips Pain Descriptors / Indicators: Aching;Discomfort;Sore Pain Intervention(s): Limited activity within patient's tolerance;Monitored during session;Repositioned    Home Living Family/patient expects to be discharged to:: Private residence Living Arrangements: Spouse/significant other Available Help at Discharge: Family (husband) Type of Home: House Home Access: Stairs to enter   Secretary/administratorntrance Stairs-Number of Steps: 11 Home Layout: One level Home Equipment: Environmental consultantWalker - 2 wheels      Prior Function Level of Independence: Needs assistance   Gait / Transfers Assistance Needed: husband reports helping his wife for the past several weeks as needed; reports helping her from the floor after last several falls           Hand Dominance        Extremity/Trunk Assessment  Lower Extremity Assessment: Generalized weakness         Communication   Communication: No difficulties  Cognition Arousal/Alertness: Awake/alert Behavior During Therapy: WFL for tasks assessed/performed Overall Cognitive Status: Within Functional Limits for tasks assessed                       General Comments      Exercises     Assessment/Plan    PT Assessment Patient needs continued PT services  PT Problem List Decreased strength;Decreased activity tolerance;Decreased balance;Decreased mobility;Decreased knowledge of use of DME          PT Treatment Interventions DME instruction;Gait training;Stair training;Functional mobility training;Therapeutic activities;Therapeutic exercise;Balance training;Patient/family education    PT Goals (Current goals can be found in the Care Plan section)  Acute Rehab PT Goals PT Goal Formulation: With patient/family Time For Goal Achievement: 04/28/16 Potential to Achieve Goals: Good    Frequency Min 3X/week   Barriers to discharge        Co-evaluation               End of Session Equipment Utilized During Treatment: Gait belt Activity Tolerance: Patient limited by fatigue Patient left: in bed;with call bell/phone within reach;with family/visitor present           Time: 1353-1413 PT Time Calculation (min) (ACUTE ONLY): 20 min   Charges:   PT Evaluation $PT Eval Low Complexity: 1 Procedure     PT G CodesKaralee Height May 08, 2016, 4:09 PM Karalee Height, SPT

## 2016-04-21 NOTE — Consult Note (Signed)
Reason for Consult: Alcohol abuse with jaundice. Referring Physician: [facility]-Dr. Charlies Silvers.  Christine [name] is an 55 y.o. female.  HPI: Christine [name] is a 55 year old white female with multiple medical problems listed below admitted with gradual weakness and worsening jaundice with increasing abdominal distention. She has not been seen in the office since 2008. As per her husband, who is at the bedside, she has been drinking a fifth of vodka for the last several years. The last few weeks she has developed worsening jaundice with gradually increasing abdominal distention. She denies having frank abdominal pain but does have some abdominal discomfort. There is no shortness of breath associated with increasing abdominal distention. CT scan of the abdomen and pelvis done after admission revealed  Small amount of ascites, markedly enlarged liver along with splenomegaly and mildly distended gallbladder. The nodular contour of the liver suggests cirrhosis. Patchy enhancements with the right hepatic lobe pain may indicate a perfusion abnormality versus a lesion in the liver.patient's husband claims that her weight has been constantly increasing over the last several months. There is no history of pedal edema. There is no history of nausea vomiting fever chills or rigors. She received 1 unit of blood today she's never had a blood transfusion prior  To this. She denies any IV drug abuse. There is no previous history of colitis or jaundice. She denies a history of hepatitis. There is no known family history of liver disease. She's had multiple falls recently with a broken humerus on the right side. She tells me she saw her primary care provider Dr. Janifer Adie a few months ago and was given refills on her pain medication for her back and her anxiolytics to help with her anxiety. Her husband who is at her bedside tells me that he buys the workup for her on her request. He claims he works several hours a day and is  not able to take care of her. She and her husband live in [place]. She has been unemployed for several years she used to be a Corporate treasurer but training..   Past Medical History:  Diagnosis Date  . Abnormal Pap smear    displasia had colposcopy  . Anemia   . Arthritis   . Closed right humeral fracture   . Depression    not currently on meds  . GERD (gastroesophageal reflux disease)   . Gout   . Hernia   . Hypertension   . Psoriasis    Past Surgical History:  Procedure Laterality Date  . COLPOSCOPY    . DILATION AND CURETTAGE OF UTERUS     Family History  Problem Relation Age of Onset  . Hypertension Mother   . Diabetes Mother   . Hypertension Father   . Diabetes Brother    Social History:  reports that she has been smoking Cigarettes.  She has a 15.00 pack-year smoking history. She has never used smokeless tobacco. She reports that she drinks alcohol. She reports that she does not use drugs.  Allergies:  Allergies  Allergen Reactions  . Sulfa Antibiotics Rash    Causes nausea.   Medications: I have reviewed the patient's current medications.  Results for orders placed or performed during the hospital encounter of 04/18/16 (from the past 48 hour(s))  Comprehensive metabolic panel     Status: Abnormal   Collection Time: 04/20/16  3:22 AM  Result Value Ref Range   Sodium 136 135 - 145 mmol/L   Potassium 2.9 (L) 3.5 -  5.1 mmol/L   Chloride 102 101 - 111 mmol/L   CO2 23 22 - 32 mmol/L   Glucose, Bld 108 (H) 65 - 99 mg/dL   BUN <5 (L) 6 - 20 mg/dL   Creatinine, Ser <0.30 (L) 0.44 - 1.00 mg/dL   Calcium 6.8 (L) 8.9 - 10.3 mg/dL   Total Protein 6.4 (L) 6.5 - 8.1 g/dL   Albumin 2.1 (L) 3.5 - 5.0 g/dL   AST 183 (H) 15 - 41 U/L   ALT 43 14 - 54 U/L   Alkaline Phosphatase 227 (H) 38 - 126 U/L   Total Bilirubin 13.0 (H) 0.3 - 1.2 mg/dL   GFR calc non Af Amer NOT CALCULATED >60 mL/min   GFR calc Af Amer NOT CALCULATED >60 mL/min    Comment:  (NOTE) The eGFR has been calculated using the CKD EPI equation. This calculation has not been validated in all clinical situations. eGFR's persistently <60 mL/min signify possible Chronic Kidney Disease.    Anion gap 11 5 - 15  CBC     Status: Abnormal   Collection Time: 04/20/16  3:22 AM  Result Value Ref Range   WBC 9.6 4.0 - 10.5 K/uL   RBC 1.90 (L) 3.87 - 5.11 MIL/uL   Hemoglobin 7.5 (L) 12.0 - 15.0 g/dL   HCT 23.2 (L) 36.0 - 46.0 %   MCV 122.1 (H) 78.0 - 100.0 fL   MCH 39.5 (H) 26.0 - 34.0 pg   MCHC 32.3 30.0 - 36.0 g/dL   RDW 22.1 (H) 11.5 - 15.5 %   Platelets 175 150 - 400 K/uL  Type and screen [place]     Status: None   Collection Time: 04/20/16  8:44 AM  Result Value Ref Range   ABO/RH(D) O POS    Antibody Screen NEG    Sample Expiration 04/23/2016    Unit Number [id number]    Blood Component Type RED CELLS,LR    Unit division 00    Status of Unit ISSUED,FINAL    Transfusion Status OK TO TRANSFUSE    Crossmatch Result Compatible   Prepare RBC     Status: None   Collection Time: 04/20/16  8:44 AM  Result Value Ref Range   Order Confirmation ORDER PROCESSED BY BLOOD BANK   ABO/Rh     Status: None   Collection Time: 04/20/16  8:44 AM  Result Value Ref Range   ABO/RH(D) O POS   CBC     Status: Abnormal   Collection Time: 04/21/16  3:10 AM  Result Value Ref Range   WBC 11.8 (H) 4.0 - 10.5 K/uL   RBC 2.50 (L) 3.87 - 5.11 MIL/uL   Hemoglobin 9.3 (L) 12.0 - 15.0 g/dL   HCT 28.3 (L) 36.0 - 46.0 %   MCV 113.2 (H) 78.0 - 100.0 fL    Comment: DELTA CHECK NOTED REPEATED TO VERIFY POST TRANSFUSION SPECIMEN    MCH 37.2 (H) 26.0 - 34.0 pg   MCHC 32.9 30.0 - 36.0 g/dL   RDW NOT CALCULATED 11.5 - 15.5 %   Platelets 192 150 - 400 K/uL  Comprehensive metabolic panel     Status: Abnormal   Collection Time: 04/21/16  3:10 AM  Result Value Ref Range   Sodium 136 135 - 145 mmol/L   Potassium 3.2 (L) 3.5 - 5.1 mmol/L   Chloride 101 101 - 111  mmol/L   CO2 24 22 - 32 mmol/L   Glucose, Bld 95 65 - 99  mg/dL   BUN 6 6 - 20 mg/dL   Creatinine, Ser <0.30 (L) 0.44 - 1.00 mg/dL   Calcium 7.1 (L) 8.9 - 10.3 mg/dL   Total Protein 6.7 6.5 - 8.1 g/dL   Albumin 2.2 (L) 3.5 - 5.0 g/dL   AST 168 (H) 15 - 41 U/L   ALT 40 14 - 54 U/L   Alkaline Phosphatase 213 (H) 38 - 126 U/L   Total Bilirubin 14.5 (H) 0.3 - 1.2 mg/dL   GFR calc non Af Amer NOT CALCULATED >60 mL/min   GFR calc Af Amer NOT CALCULATED >60 mL/min    Comment: (NOTE) The eGFR has been calculated using the CKD EPI equation. This calculation has not been validated in all clinical situations. eGFR's persistently <60 mL/min signify possible Chronic Kidney Disease.    Anion gap 11 5 - 15   Review of Systems  Constitutional: Positive for chills and malaise/fatigue. Negative for diaphoresis, fever and weight loss.  HENT: Negative.   Eyes: Negative.   Respiratory: Negative for cough, hemoptysis, sputum production and wheezing.   Cardiovascular: Negative.   Gastrointestinal: Positive for constipation and heartburn. Negative for abdominal pain, blood in stool, diarrhea, melena, nausea and vomiting.  Genitourinary: Negative.   Musculoskeletal: Positive for back pain, falls, joint pain and myalgias.  Skin: Negative.   Neurological: Positive for weakness.  Endo/Heme/Allergies: Negative.   Psychiatric/Behavioral: Positive for depression and substance abuse. The patient is nervous/anxious.    Blood pressure 105/60, pulse 95, temperature 98.8 F (37.1 C), temperature source Oral, resp. rate 20, height _0  (1.626 m), weight 78.4 kg (172 lb 13.5 oz), SpO2 96 %. Physical Exam  Constitutional: She is oriented to person, place, and time. She appears well-developed and well-nourished. She appears listless.  HENT:  Head: Normocephalic and atraumatic.  Eyes: Conjunctivae and EOM are normal. Pupils are equal, round, and reactive to light. Scleral icterus is present.  Neck: Normal range of  motion. Neck supple.  Cardiovascular: Normal rate and regular rhythm.   Respiratory: Effort normal and breath sounds normal.  GI: Soft. Bowel sounds are normal. She exhibits distension. She exhibits no mass. There is no hepatosplenomegaly. There is no tenderness. There is no rebound and no guarding.  Enlarged pendulous abdomen with HSM; no definite fluid thrill noted    Musculoskeletal: Normal range of motion.  Neurological: She is oriented to person, place, and time. She appears listless.  Skin: Skin is warm and dry.   Assessment/Plan: #1 Alcoholic cirrhosis with hyperbilirubinemia and hepatosplenomegaly-abnormal CT scan of the liver. It will be helpful to get a pre-and post-contrast enhanced MRI to rule out hepatocellular carcinoma. It will also will be helpful to check  AFP levels. DF is 31. #2 Anemia of chronic disease-Patient received 1 unit of packed red blood cells today. #3 GERD-On PPI's. #4 Hypokalemia due to poor intake.  #5 Arthritis with back pain and ?gout  #6 Hypertension.  #7 Depression. Derreck Wiltsey 04/21/2016, 4:59 PM

## 2016-04-21 NOTE — Progress Notes (Addendum)
Patient ID: Christine [name], female   DOB: 08/17/1960, 55 y.o.   MRN: [id number]   PROGRESS NOTE    STARKISHA TULLIS  JXB:[id number] DOB: 08-Jan-1961 DOA: 04/18/2016  PCP: [name], MD   Brief Narrative:  55 y.o.female with past medical history of alcohol abuse and alcohol dependence, history of recent falls in last couple of weeks. She presented to ED for further evaluation. Her husband at the bedside reports he is unable to take care of her at home at this point. Patient was hemodynamically stable on the admission. Her blood work was notable for hemoglobin of 8.4, potassium 3.1, lactic acid 6.7 and mild elevation in troponin 0.03. Liver function enzymes were as follows: AST was 249, ALT 54, ALP 279 and bilirubin 10.6. Ammonia level was 43. Alcohol level was 132. CT scan showed evidence of cirrhosis.  Assessment & Plan:  Acute alcohol intoxication without delirium / Hyperammonemia (HCC) - Alcohol level 132 on the admission - CIWA protocol initiated but will d/c today, no reports of withdrawals  - Ammonia level 43 - Stable at this time, normal mental status     Lactic acidosis - Will repeat level in am - Likely in the setting of end stage liver disease   Alcoholic liver cirrhosis / Abnormal LFT's - Evidence of liver cirrhosis on CT scan - [place] with hepatic steatosis but no significant ascites - AST and ALP slightly better today but bilirubin still high - Continue lactulose but increase dose to 20 gm instead of 10 gm - GI consulted   Anemia of chronic disease - Due to bone marrow suppression from liver cirrhosis - Hemoglobin 7.5 on 9/17 and she has received 1 U PRBC 9/17 - Hgb this am 9.3   Hypokalemia - Due to poor po intake - Continue to supplement potassium  - Check magnesium level   Mild troponin elevation - Demand ischemia from liver cirrhosis, hypokalemia - Repeat troponin WNL - No reports of chest pain  - 2 D ECHO with normal EF  DVT prophylaxis: Use SCD's  bilaterally  Code Status: full code  Family Communication: husband at the bedside this am Disposition Plan: Will need GI consultation, home once LFTs better   Consultants:   Gastroenterology   Procedures:   None   Antimicrobials:   None    Subjective: No overnight events.   Objective: Vitals:   04/20/16 1648 04/20/16 1926 04/20/16 2137 04/21/16 0636  BP: 103/62 116/69 112/66 111/71  Pulse: (!) 102 (!) 114 (!) 105 99  Resp: 16 16 16 20   Temp: 99 F (37.2 C) 99.1 F (37.3 C) 99.3 F (37.4 C) 98.1 F (36.7 C)  TempSrc: Oral Oral Oral Oral  SpO2: 97% 96% 97% 98%  Weight:      Height:        Intake/Output Summary (Last 24 hours) at 04/21/16 1102 Last data filed at 04/20/16 1926  Gross per 24 hour  Intake              677 ml  Output              150 ml  Net              527 ml   Filed Weights   04/18/16 1606 04/19/16 0331  Weight: 77.1 kg (170 lb) 78.4 kg (172 lb 13.5 oz)    Examination:  General exam: Appears calm and comfortable; jaundiced skin  Respiratory system: No wheezing, no rhonchi Cardiovascular system: S1 & S2 heard,  rate controlled Gastrointestinal system: Distended abdomen but not tender, appreciate bowel sounds Central nervous system: No focal neurological deficits. Extremities: No edema, palpable pulses Skin: No rashes, lesions or ulcers Psychiatry: Mood & affect appropriate.   Data Reviewed: I have personally reviewed following labs and imaging studies  CBC:  Recent Labs Lab 04/18/16 1650 04/19/16 0400 04/20/16 0322 04/21/16 0310  WBC 8.0 9.8 9.6 11.8*  NEUTROABS 6.3  --   --   --   HGB 8.4* 8.2* 7.5* 9.3*  HCT 25.4* 24.6* 23.2* 28.3*  MCV 118.7* 119.4* 122.1* 113.2*  PLT 203 191 175 192   Basic Metabolic Panel:  Recent Labs Lab 04/18/16 1650 04/19/16 0400 04/20/16 0322 04/21/16 0310  NA 135 136 136 136  K 3.1* 3.0* 2.9* 3.2*  CL 99* 102 102 101  CO2 19* 19* 23 24  GLUCOSE 108* 121* 108* 95  BUN <5* <5* <5* 6    CREATININE <0.30* <0.30* <0.30* <0.30*  CALCIUM 7.3* 6.9* 6.8* 7.1*   GFR: CrCl cannot be calculated (This lab value cannot be used to calculate CrCl because it is not a number: <0.30). Liver Function Tests:  Recent Labs Lab 04/18/16 1650 04/19/16 0400 04/20/16 0322 04/21/16 0310  AST 249* 234* 183* 168*  ALT 54 53 43 40  ALKPHOS 279* 276* 227* 213*  BILITOT 10.6* 12.6* 13.0* 14.5*  PROT 6.8 6.8 6.4* 6.7  ALBUMIN 2.3* 2.3* 2.1* 2.2*    Recent Labs Lab 04/18/16 1650  LIPASE 19    Recent Labs Lab 04/18/16 1659  AMMONIA 43*   Coagulation Profile:  Recent Labs Lab 04/18/16 1650  INR 1.23   Cardiac Enzymes:  Recent Labs Lab 04/19/16 0400 04/19/16 0935 04/19/16 1528  TROPONINI 0.03* 0.03* <0.03   BNP (last 3 results) No results for input(s): PROBNP in the last 8760 hours. HbA1C: No results for input(s): HGBA1C in the last 72 hours. CBG: No results for input(s): GLUCAP in the last 168 hours. Lipid Profile: No results for input(s): CHOL, HDL, LDLCALC, TRIG, CHOLHDL, LDLDIRECT in the last 72 hours. Thyroid Function Tests:  Recent Labs  04/19/16 0400  TSH 0.977   Anemia Panel:  Recent Labs  04/18/16 1650 04/18/16 1706 04/18/16 1850  VITAMINB12  --  426  --   FOLATE 2.4*  --   --   FERRITIN  --  2,700*  --   TIBC  --  102*  --   IRON  --  100  --   RETICCTPCT  --   --  6.8*   Urine analysis:    Component Value Date/Time   COLORURINE ORANGE (A) 04/18/2016 1629   APPEARANCEUR CLOUDY (A) 04/18/2016 1629   LABSPEC 1.012 04/18/2016 1629   PHURINE 6.0 04/18/2016 1629   GLUCOSEU NEGATIVE 04/18/2016 1629   HGBUR NEGATIVE 04/18/2016 1629   BILIRUBINUR LARGE (A) 04/18/2016 1629   BILIRUBINUR small 12/27/2011 1031   KETONESUR 15 (A) 04/18/2016 1629   PROTEINUR NEGATIVE 04/18/2016 1629   UROBILINOGEN 0.2 12/27/2011 1031   NITRITE POSITIVE (A) 04/18/2016 1629   LEUKOCYTESUR TRACE (A) 04/18/2016 1629   Sepsis  Labs: @LABRCNTIP (procalcitonin:4,lacticidven:4)   ) Recent Results (from the past 240 hour(s))  MRSA PCR Screening     Status: None   Collection Time: 04/19/16  3:30 AM  Result Value Ref Range Status   MRSA by PCR NEGATIVE NEGATIVE Final      Radiology Studies: US Abdomen Complete Result Date: 04/19/2016 1. Hepatomegaly with extensive hepatic steatosis. 2. Mild splenomegaly. 3. Small  amount of ascites. 4. No acute findings. Normal gallbladder. No bile duct dilation. Pancreas not visualized. Electronically Signed   By: Amie Portlandavid  Ormond M.D.   On: 04/19/2016 12:23   Ct Abdomen Pelvis W Contrast Result Date: 04/18/2016 The liver is markedly enlarged and decreased in attenuation most compatible with steatosis and/or associated hepatic edema. There is contour abnormality predominately involving the right hepatic lobe with left hepatic lobe hypertrophy suggestive of cirrhosis. Patchy enhancement throughout the right hepatic lobe is nonspecific and may be secondary to acute hepatitis or perfusional anomaly. Consider dedicated evaluation of the liver in the non acute setting with pre and post contrast-enhanced MRI to exclude the possibility of underlying hepatic lesion. Small amount of perihepatic fluid as well as fluid within the paracolic gutters and pelvis. Nonspecific wall thickening of the small bowel within the central abdomen as well as the proximal colon. These findings may be secondary to portal venous hypertension however acute enteritis is not excluded. Splenomegaly. Electronically Signed   By: Annia Beltrew  Davis M.D.   On: 04/18/2016 19:14     Scheduled Meds: . allopurinol  100 mg Oral Daily  . feeding supplement (PRO-STAT SUGAR FREE 64)  30 mL Oral TID  . folic acid  1 mg Oral Daily  . furosemide  40 mg Oral Daily  . lactulose  10 g Oral BID  . loratadine  10 mg Oral Daily  . metoprolol tartrate  25 mg Oral BID  . multivitamin with minerals  1 tablet Oral Daily  . sodium chloride flush  3  mL Intravenous Q12H  . thiamine  100 mg Oral Daily   Continuous Infusions: . pantoprozole (PROTONIX) infusion 8 mg/hr (04/21/16 1006)     LOS: 2 days    Time spent: 25 minutes  Greater than 50% of the time spent on counseling and coordinating the care.   Manson PasseyEVINE, ALMA, MD [facility] Pager [phone]  If 7PM-7AM, please contact night-coverage [email or web address] Password [email or web address] 04/21/2016, 11:02 AM

## 2016-04-22 LAB — CBC
HCT: 27.4 % — ABNORMAL LOW (ref 36.0–46.0)
HEMOGLOBIN: 8.9 g/dL — AB (ref 12.0–15.0)
MCH: 37.7 pg — ABNORMAL HIGH (ref 26.0–34.0)
MCHC: 32.5 g/dL (ref 30.0–36.0)
MCV: 116.1 fL — ABNORMAL HIGH (ref 78.0–100.0)
PLATELETS: 172 10*3/uL (ref 150–400)
RBC: 2.36 MIL/uL — AB (ref 3.87–5.11)
WBC: 10.9 10*3/uL — ABNORMAL HIGH (ref 4.0–10.5)

## 2016-04-22 LAB — COMPREHENSIVE METABOLIC PANEL
ALK PHOS: 179 U/L — AB (ref 38–126)
ALT: 38 U/L (ref 14–54)
ANION GAP: 11 (ref 5–15)
AST: 140 U/L — ABNORMAL HIGH (ref 15–41)
Albumin: 2 g/dL — ABNORMAL LOW (ref 3.5–5.0)
BILIRUBIN TOTAL: 15 mg/dL — AB (ref 0.3–1.2)
BUN: 8 mg/dL (ref 6–20)
CALCIUM: 7 mg/dL — AB (ref 8.9–10.3)
CO2: 24 mmol/L (ref 22–32)
Chloride: 102 mmol/L (ref 101–111)
GLUCOSE: 109 mg/dL — AB (ref 65–99)
Potassium: 2.7 mmol/L — CL (ref 3.5–5.1)
Sodium: 137 mmol/L (ref 135–145)
TOTAL PROTEIN: 6.2 g/dL — AB (ref 6.5–8.1)

## 2016-04-22 LAB — LACTIC ACID, PLASMA: LACTIC ACID, VENOUS: 2.5 mmol/L — AB (ref 0.5–1.9)

## 2016-04-22 LAB — MAGNESIUM: MAGNESIUM: 1.2 mg/dL — AB (ref 1.7–2.4)

## 2016-04-22 MED ORDER — LORAZEPAM 2 MG/ML IJ SOLN
1.0000 mg | Freq: Once | INTRAMUSCULAR | Status: DC
  Filled 2016-04-22: qty 1

## 2016-04-22 MED ORDER — POTASSIUM CHLORIDE 10 MEQ/100ML IV SOLN
10.0000 meq | INTRAVENOUS | Status: AC
  Administered 2016-04-22 (×4): 10 meq via INTRAVENOUS
  Filled 2016-04-22 (×4): qty 100

## 2016-04-22 MED ORDER — MAGNESIUM SULFATE 2 GM/50ML IV SOLN
2.0000 g | Freq: Once | INTRAVENOUS | Status: AC
Start: 2016-04-22 — End: 2016-04-22
  Administered 2016-04-22: 2 g via INTRAVENOUS
  Filled 2016-04-22: qty 50

## 2016-04-22 NOTE — Progress Notes (Signed)
CRITICAL VALUE ALERT  Critical value received:  Lactic acid 2.5  Date of notification:  04/22/2016  Time of notification:  0421  Critical value read back:yes  Nurse who received alert:  Jamesetta OrleansWillie bernita Taelor Waymire,rn  MD notified (1st page):  Rollen SoxKatherine Schorr,Np  Time of first page:  0424  MD notified (2nd page):Katherine Schorr,Np  Time of second ZOXW:[id number]:[id number]  Responding MD:  Natalia LeatherwoodKatherine schorr,np  Time MD responded:  [phone]

## 2016-04-22 NOTE — Clinical Social Work Note (Signed)
Clinical Social Work Assessment  Patient Details  Name: Christine [name] MRN: [id number] Date of Birth: June 20, 1961  Date of referral:  04/22/16               Reason for consult:  Discharge Planning                Permission sought to share information with:  Chartered certified accountant granted to share information::  Yes, Verbal Permission Granted  Name::        Agency::     Relationship::     Contact Information:     Housing/Transportation Living arrangements for the past 2 months:  Single Family Home Source of Information:  Patient Patient Interpreter Needed:  None Criminal Activity/Legal Involvement Pertinent to Current Situation/Hospitalization:  No - Comment as needed Significant Relationships:  Spouse Lives with:  Spouse Do you feel safe going back to the place where you live?   (SNF recommended.) Need for family participation in patient care:  Yes (Comment)  Care giving concerns:  No family at bedside. MD notes that spouse is unable to care for pt at home in this condition.   Social Worker assessment / plan:  Pt hospitalized on 04/18/16 with acute hepatic failure. CSW consulted for assistance with d/c planning. PN reviewed. Pt has a dx of alcohol abuse / dependence. PT has evaluated pt and has recommended SNF at d/c. CSW met with pt to review recommendations. Pt was alert and oriented x4 but very sleepy. ETOH was not addressed at this time. Pt gave CSW permission to contact spouse to assist with d/c planning. Spouse contacted and spouse agreed with plan for SNF. SNF search has been initiated and bed offers provided. Spouse chose [facility]. SNF contacted and will begin [facility] authorization process. CSW will continue to follow to assist with d/c planning.  Employment status:  Unemployed Forensic scientist:  Managed Care PT Recommendations:  [place] / Referral to community resources:  [place]  Patient/Family's Response to care:  Pt agrees with plan for SNF at d/c.  Patient/Family's Understanding of and Emotional Response to Diagnosis, Current Treatment, and Prognosis:  " I'm not feel that great. " Pt acknowledges she is very weak, juandice and has an ETOH problem but is unable to keep her eyes open long enough to address any of these issues. Spouse reports pt has been self medicating with ETOH due to feeling depressed about losing her job. Spouse reports pt as not attempted to stop drinking.  Emotional Assessment Appearance:  Appears stated age Attitude/Demeanor/Rapport:  Other (cooperative) Affect (typically observed):  Flat, Other (lethargic) Orientation:  Oriented to Self, Oriented to Place, Oriented to  Time, Oriented to Situation Alcohol / Substance use:  Alcohol Use Psych involvement (Current and /or in the community):  No (Comment)  Discharge Needs  Concerns to be addressed:  Discharge Planning Concerns Readmission within the last 30 days:  No Current discharge risk:  Substance Abuse Barriers to Discharge:  Insurance Authorization   Loraine Maple  [phone] 04/22/2016, 9:42 AM

## 2016-04-22 NOTE — NC FL2 (Signed)
[place] MEDICAID FL2 LEVEL OF CARE SCREENING TOOL     IDENTIFICATION  Patient Name: Christine [name] Birthdate: [birth year] Sex: female Admission Date (Current Location): 04/18/2016  [facility] [place] Number:  Producer, television/film/video and Address:  [facility],  [address], [place] [address]      Provider Number: [id number]  Attending Physician Name and Address:  Alison Murray, MD  Relative Name and Phone Number:       Current Level of Care: Hospital Recommended Level of Care: Skilled Nursing Facility Prior Approval Number:    Date Approved/Denied:   PASRR Number: [id number]  Discharge Plan: SNF    Current Diagnoses: Patient Active Problem List   Diagnosis Date Noted  . Hypokalemia 04/19/2016  . Alcohol abuse 04/19/2016  . Abnormal liver function 04/19/2016  . Acute alcohol intoxication (HCC) 04/19/2016  . Alcoholic cirrhosis of liver with ascites (HCC) 04/19/2016  . Anemia of chronic disease 04/19/2016  . Hyperammonemia (HCC) 04/19/2016  . Troponin level elevated 04/19/2016  . Lactic acidosis 04/19/2016    Orientation RESPIRATION BLADDER Height & Weight     Self, Time, Situation, Place  Normal Continent Weight: 172 lb 13.5 oz (78.4 kg) Height:  5\' 4"  (162.6 cm)  BEHAVIORAL SYMPTOMS/MOOD NEUROLOGICAL BOWEL NUTRITION STATUS  Other (Comment) (no behaviors)   Continent Diet  AMBULATORY STATUS COMMUNICATION OF NEEDS Skin   Limited Assist Verbally Normal                       Personal Care Assistance Level of Assistance  Bathing, Feeding, Dressing Bathing Assistance: Limited assistance Feeding assistance: Independent Dressing Assistance: Limited assistance     Functional Limitations Info  Sight, Hearing, Speech Sight Info: Adequate Hearing Info: Adequate Speech Info: Adequate    SPECIAL CARE FACTORS FREQUENCY  PT (By licensed PT), OT (By licensed OT)     PT Frequency: 5x wk OT Frequency: 5x wk            Contractures  Contractures Info: Not present    Additional Factors Info  Code Status, Allergies, Psychotropic Code Status Info: full code Allergies Info: Sulfa Antibiotics Psychotropic Info: xanax         Current Medications (04/22/2016):  This is the current hospital active medication list Current Facility-Administered Medications  Medication Dose Route Frequency Provider Last Rate Last Dose  . allopurinol (ZYLOPRIM) tablet 100 mg  100 mg Oral Daily Pearson Grippe, MD   100 mg at 04/22/16 0916  . diphenhydrAMINE (BENADRYL) capsule 25 mg  25 mg Oral Q6H PRN Pearson Grippe, MD      . feeding supplement (PRO-STAT SUGAR FREE 64) liquid 30 mL  30 mL Oral TID Alison Murray, MD   30 mL at 04/22/16 0916  . folic acid (FOLVITE) tablet 1 mg  1 mg Oral Daily Pearson Grippe, MD   1 mg at 04/22/16 0916  . furosemide (LASIX) tablet 40 mg  40 mg Oral Daily Pearson Grippe, MD   40 mg at 04/22/16 0916  . lactulose (CHRONULAC) 10 GM/15ML solution 20 g  20 g Oral BID Alison Murray, MD   20 g at 04/22/16 0916  . loratadine (CLARITIN) tablet 10 mg  10 mg Oral Daily Pearson Grippe, MD   10 mg at 04/22/16 0916  . LORazepam (ATIVAN) injection 1 mg  1 mg Intravenous Q4H PRN Alison Murray, MD   1 mg at 04/22/16 0741  . LORazepam (ATIVAN) injection 1 mg  1 mg Intravenous Once Alison MurrayAlma M Devine, MD      . magnesium sulfate IVPB 2 g 50 mL  2 g Intravenous Once Alison MurrayAlma M Devine, MD      . metoprolol tartrate (LOPRESSOR) tablet 25 mg  25 mg Oral BID Pearson GrippeJames Kim, MD   25 mg at 04/22/16 0916  . multivitamin with minerals tablet 1 tablet  1 tablet Oral Daily Pearson GrippeJames Kim, MD   1 tablet at 04/22/16 0916  . ondansetron (ZOFRAN) tablet 4 mg  4 mg Oral Q6H PRN Pearson GrippeJames Kim, MD       Or  . ondansetron [facility](ZOFRAN) injection 4 mg  4 mg Intravenous Q6H PRN Pearson GrippeJames Kim, MD      . oxyCODONE (Oxy IR/ROXICODONE) immediate release tablet 5 mg  5 mg Oral Q4H PRN Pearson GrippeJames Kim, MD   5 mg at 04/22/16 0741  . potassium chloride 10 mEq in 100 mL IVPB  10 mEq Intravenous Q1 Hr x 4 Roma KayserKatherine P Schorr,  NP   10 mEq at 04/22/16 0831  . promethazine (PHENERGAN) injection 6.25 mg  6.25 mg Intravenous Q6H PRN Pearson GrippeJames Kim, MD      . sodium chloride flush (NS) 0.9 % injection 3 mL  3 mL Intravenous Q12H Pearson GrippeJames Kim, MD   3 mL at 04/22/16 0917  . thiamine (VITAMIN B-1) tablet 100 mg  100 mg Oral Daily Pearson GrippeJames Kim, MD   100 mg at 04/22/16 [id number]     Discharge Medications: Please see discharge summary for a list of discharge medications.  Relevant Imaging Results:  Relevant Lab Results:   Additional Information SS # [id number]  Leonard Feigel, Dickey GaveJamie Lee, LCSW

## 2016-04-22 NOTE — Progress Notes (Signed)
CRITICAL VALUE ALERT  Critical value received:  Lactic acid level 2.5  Date of notification:  04/22/2016  Time of notification:  0421  Critical value read back:Yes.    Nurse who received alert:  Jamesetta OrleansWillie bernita Rhyli Depaula,rn  MD notified (1st page):  [phone]  Time of first page:  Rollen SoxKatherine Schorr,Np  MD notified (2nd page):Katherine Schorr,np  Time of second JXBJ:[id number]:[id number]  Responding MD:  Rollen SoxKatherine Schorr,np  Time MD responded:  [phone]

## 2016-04-22 NOTE — Progress Notes (Signed)
Nursing Note: Paged on-call again .wbb

## 2016-04-22 NOTE — Care Management Note (Signed)
Case Management Note  Patient Details  Name: Christine [name] MRN: [id number] Date of Birth: 1960-11-03  Subjective/Objective:    55 yo admitted with Acute alcohol intoxication                Action/Plan: From home with spouse. For SNF at dc.  Expected Discharge Date:   (unknown)               Expected Discharge Plan:  Skilled Nursing Facility  In-House Referral:  Clinical Social Work  Discharge planning Services  CM Consult  Post Acute Care Choice:    Choice offered to:     DME Arranged:    DME Agency:     HH Arranged:    HH Agency:     Status of Service:  In process, will continue to follow  If discussed at Long Length of Stay Meetings, dates discussed:    Additional CommentsBartholome Bill:  Carry Weesner H, RN 04/22/2016, 1:26 PM  [phone]

## 2016-04-22 NOTE — Progress Notes (Signed)
Physical Therapy Treatment Patient Details Name: Joaquim NamJanet M Dinneen MRN: [id number] DOB: [birth year] Today's Date: 04/22/2016    History of Present Illness Pt is a 55 year old female with past medical history of alcohol abuse/alcohol dependence and 3-5 falls in last couple of weeks admitted for worsening fatigue and distended abdomen.    PT Comments    Pt assisted into bathroom (encouraged to continue bathroom vs BSC with nursing to keep up mobility) and then just barely into hallway prior to fatigue.  Pt reports slight dizziness with mobility however did not become worse. Continue to recommend SNF upon d/c.   Follow Up Recommendations  SNF     Equipment Recommendations  None recommended by PT    Recommendations for Other Services       Precautions / Restrictions Precautions Precautions: Fall    Mobility  Bed Mobility Overal bed mobility: Needs Assistance Bed Mobility: Supine to Sit     Supine to sit: Mod assist     General bed mobility comments: slight assist for LEs over EOB and assist for trunk upright  Transfers Overall transfer level: Needs assistance Equipment used: Rolling walker (2 wheeled) Transfers: Sit to/from Stand Sit to Stand: Min assist         General transfer comment: assistance for steadying, rise, and controlled descent  Ambulation/Gait Ambulation/Gait assistance: Min assist Ambulation Distance (Feet): 35 Feet Assistive device: Rolling walker (2 wheeled) Gait Pattern/deviations: Step-to pattern;Decreased dorsiflexion - right     General Gait Details: pt leads with R LE, slow gait, cues for RW positioning and avoiding obstacles, ambulated to bathroom and then just barely into hallway, assist for LOB towards end of gait   Stairs            Wheelchair Mobility    Modified Rankin (Stroke Patients Only)       Balance                                    Cognition Arousal/Alertness: Awake/alert Behavior During Therapy:  WFL for tasks assessed/performed Overall Cognitive Status: Within Functional Limits for tasks assessed                      Exercises      General Comments        Pertinent Vitals/Pain Pain Assessment: 0-10 Pain Score: 6  Pain Location: back and B hips Pain Descriptors / Indicators: Aching;Discomfort Pain Intervention(s): Limited activity within patient's tolerance;Monitored during session    Home Living                      Prior Function            PT Goals (current goals can now be found in the care plan section) Progress towards PT goals: Progressing toward goals    Frequency    Min 3X/week      PT Plan Current plan remains appropriate    Co-evaluation             End of Session Equipment Utilized During Treatment: Gait belt Activity Tolerance: Patient limited by fatigue Patient left: in bed;with call bell/phone within reach;with bed alarm set (sitting EOB with food and her friend)     Time: [id number] PT Time Calculation (min) (ACUTE ONLY): 14 min  Charges:  $Gait Training: 8-22 mins  G Codes:      Chisa Kushner,KATHrine E 05/17/2016, 4:34 PM Zenovia Jarred, PT, DPT 2016-05-17 Pager: [phone]

## 2016-04-22 NOTE — Progress Notes (Signed)
CRITICAL VALUE ALERT  Critical value received:  Potassium 2.7  Date of notification:  04/22/2016  Time of notification:  0422  Critical value read back:yes  Nurse who received alert:  Jamesetta OrleansWillie bernita Dauntae Derusha,rn  MD notified (1st page):  Rollen SoxKatherine Schorr,Np  Time of first page:  0424  MD notified (2nd page):Katherine schorr,np  Time of second ZOXW:[id number]:[id number]  Responding MD:  Rollen SoxKatherine Schorr,np  Time MD responded:  [phone]

## 2016-04-22 NOTE — Progress Notes (Signed)
Subjective: Some back pain and hip pain.  Overall she does not have any complaints.  Objective: Vital signs in last 24 hours: Temp:  [98.8 F (37.1 C)-100.4 F (38 C)] 98.8 F (37.1 C) (09/19 1350) Pulse Rate:  [74-110] 95 (09/19 1350) Resp:  [18-20] 18 (09/19 1350) BP: (106-116)/(56-66) 106/56 (09/19 1350) SpO2:  [95 %-99 %] 95 % (09/19 1350) Last BM Date: 04/22/16  Intake/Output from previous day: 09/18 0701 - 09/19 0700 In: 560 [P.O.:560] Out: 151 [Urine:150; Stool:1] Intake/Output this shift: Total I/O In: 450 [IV Piggyback:450] Out: 1100 [Urine:1100]  General appearance: alert, but fatigued appearing GI: distended, + hepatomegaly, no pain  Lab Results:  Recent Labs  04/20/16 0322 04/21/16 0310 04/22/16 0352  WBC 9.6 11.8* 10.9*  HGB 7.5* 9.3* 8.9*  HCT 23.2* 28.3* 27.4*  PLT 175 192 172   BMET  Recent Labs  04/20/16 0322 04/21/16 0310 04/22/16 0352  NA 136 136 137  K 2.9* 3.2* 2.7*  CL 102 101 102  CO2 23 24 24   GLUCOSE 108* 95 109*  BUN <5* 6 8  CREATININE <0.30* <0.30* <0.30*  CALCIUM 6.8* 7.1* 7.0*   LFT  Recent Labs  04/22/16 0352  PROT 6.2*  ALBUMIN 2.0*  AST 140*  ALT 38  ALKPHOS 179*  BILITOT 15.0*   PT/INR No results for input(s): LABPROT, INR in the last 72 hours. Hepatitis Panel No results for input(s): HEPBSAG, HCVAB, HEPAIGM, HEPBIGM in the last 72 hours. C-Diff No results for input(s): CDIFFTOX in the last 72 hours. Fecal Lactopherrin No results for input(s): FECLLACTOFRN in the last 72 hours.  Studies/Results: No results found.  Medications:  Scheduled: . allopurinol  100 mg Oral Daily  . feeding supplement (PRO-STAT SUGAR FREE 64)  30 mL Oral TID  . folic acid  1 mg Oral Daily  . furosemide  40 mg Oral Daily  . lactulose  20 g Oral BID  . loratadine  10 mg Oral Daily  . LORazepam  1 mg Intravenous Once  . metoprolol tartrate  25 mg Oral BID  . multivitamin with minerals  1 tablet Oral Daily  . sodium  chloride flush  3 mL Intravenous Q12H  . 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 2) ETOH cirrhosis. 3) ETOH abuse.   Clinically she is stable.  Her TB continues to rise, but this is the last value to improve.  The AST/ALT is declining.  Her admission DF was 11.  From the hepatic standpoint she is stable and she can recover, but she needs to stop ETOH completely.  I had a long discussion with her and her sister-in-law about the ramifications of continued ETOH abuse.  She requires professional counseling for her ETOH addiction.  Before discharge I told her that she needs to make a plan, i.e., arrange for AA meetings or enlist professional counseling.    Plan: 1) Continue with supportive care. 2) Continue with proper nutrition.   LOS: 3 days   Christine [name] D 04/22/2016, 4:29 PM

## 2016-04-22 NOTE — Progress Notes (Signed)
Patient ID: Christine [name] Lightcap, female   DOB: Mar 20, 1961, 55 y.o.   MRN: [id number]   PROGRESS NOTE    Christine [name] Ranta  NUU:[id number] DOB: Mar 20, 1961 DOA: 04/18/2016  PCP: Lonia BloodGARBA,LAWAL, MD   Brief Narrative:  55 y.o.female with past medical history of alcohol abuse and alcohol dependence, history of recent falls in last couple of weeks. She presented to ED for further evaluation. Her husband at the bedside reports he is unable to take care of her at home at this point. Patient was hemodynamically stable on the admission. Her blood work was notable for hemoglobin of 8.4, potassium 3.1, lactic acid 6.7 and mild elevation in troponin 0.03. Liver function enzymes were as follows: AST was 249, ALT 54, ALP 279 and bilirubin 10.6. Ammonia level was 43. Alcohol level was 132. CT scan showed evidence of cirrhosis.  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 / Abnormal LFT's - Evidence of liver cirrhosis on CT scan - US with hepatic steatosis but no significant ascites - AST and ALP improving but bilirubin still high and per GI it is the last one to trend down - Continue lactulose 20 gm instead of 10 gm - GI spoke with pt about importance of stopping alcohol use  Anemia of chronic disease - Due to bone marrow suppression from liver cirrhosis - Hemoglobin 7.5 on 9/17 and she has received 1 U PRBC 9/17 - Hgb 8.9 today   Hypokalemia / Hypomagnesemia - Due to poor po intake, alcohol abuse  - Continue to supplement potassium and magnesium - Check MP in am and magnesium level in am   Mild troponin elevation - Demand ischemia from liver cirrhosis, hypokalemia -  Repeat troponin WNL - No reports of chest pain  - 2 D ECHO with normal EF  DVT prophylaxis: Use SCD's bilaterally  Code Status: full code  Family Communication: husband at the bedside this am Disposition Plan: to SNF in next 24-48 hours if cleared by GI   Consultants:   Gastroenterology   Procedures:   None   Antimicrobials:   None    Subjective: No overnight events.   Objective: Vitals:   04/22/16 0229 04/22/16 0641 04/22/16 0916 04/22/16 1350  BP:  114/60 (!) 116/57 (!) 106/56  Pulse:  (!) 108 74 95  Resp:  18  18  Temp: 100.3 F (37.9 C) 99.5 F (37.5 C)  98.8 F (37.1 C)  TempSrc:  Oral  Oral  SpO2:  97%  95%  Weight:      Height:        Intake/Output Summary (Last 24 hours) at 04/22/16 2106 Last data filed at 04/22/16 1820  Gross per 24 hour  Intake              630 ml  Output             1251 ml  Net             -621 ml   Filed Weights   04/18/16 1606 04/19/16 0331  Weight: 77.1 kg (170 lb) 78.4 kg (172 lb 13.5 oz)    Examination:  General exam: Appears calm and comfortable;  jaundiced skin  Respiratory system: No wheezing, no rhonchi Cardiovascular system: S1 & S2 heard, RRR Gastrointestinal system: (+) BS, distended but not tender abdomen  Central nervous system: Nonfocal  Extremities: No edema, palpable pulses bilaterally  Skin: warm and dry  Psychiatry: Mood and behavior normal    Data Reviewed: I have personally reviewed following labs and imaging studies  CBC:  Recent Labs Lab 04/18/16 1650 04/19/16 0400 04/20/16 0322 04/21/16 0310 04/22/16 0352  WBC 8.0 9.8 9.6 11.8* 10.9*  NEUTROABS 6.3  --   --   --   --   HGB 8.4* 8.2* 7.5* 9.3* 8.9*  HCT 25.4* 24.6* 23.2* 28.3* 27.4*  MCV 118.7* 119.4* 122.1* 113.2* 116.1*  PLT 203 191 175 192 172   Basic Metabolic Panel:  Recent Labs Lab 04/18/16 1650 04/19/16 0400 04/20/16 0322 04/21/16 0310 04/22/16 0352  NA 135 136 136 136 137  K 3.1* 3.0* 2.9* 3.2* 2.7*  CL 99* 102 102  101 102  CO2 19* 19* 23 24 24   GLUCOSE 108* 121* 108* 95 109*  BUN <5* <5* <5* 6 8  CREATININE <0.30* <0.30* <0.30* <0.30* <0.30*  CALCIUM 7.3* 6.9* 6.8* 7.1* 7.0*  MG  --   --   --   --  1.2*   GFR: CrCl cannot be calculated (This lab value cannot be used to calculate CrCl because it is not a number: <0.30). Liver Function Tests:  Recent Labs Lab 04/18/16 1650 04/19/16 0400 04/20/16 0322 04/21/16 0310 04/22/16 0352  AST 249* 234* 183* 168* 140*  ALT 54 53 43 40 38  ALKPHOS 279* 276* 227* 213* 179*  BILITOT 10.6* 12.6* 13.0* 14.5* 15.0*  PROT 6.8 6.8 6.4* 6.7 6.2*  ALBUMIN 2.3* 2.3* 2.1* 2.2* 2.0*    Recent Labs Lab 04/18/16 1650  LIPASE 19    Recent Labs Lab 04/18/16 1659  AMMONIA 43*   Coagulation Profile:  Recent Labs Lab 04/18/16 1650  INR 1.23   Cardiac Enzymes:  Recent Labs Lab 04/19/16 0400 04/19/16 0935 04/19/16 1528  TROPONINI 0.03* 0.03* <0.03   BNP (last 3 results) No results for input(s): PROBNP in the last 8760 hours. HbA1C: No results for input(s): HGBA1C in the last 72 hours. CBG: No results for input(s): GLUCAP in the last 168 hours. Lipid Profile: No results for input(s): CHOL, HDL, LDLCALC, TRIG, CHOLHDL, LDLDIRECT in the last 72 hours. Thyroid Function Tests: No results for input(s): TSH, T4TOTAL, FREET4, T3FREE, THYROIDAB in the last 72 hours. Anemia Panel: No results for input(s): VITAMINB12, FOLATE, FERRITIN, TIBC, IRON, RETICCTPCT in the last 72 hours. Urine analysis:    Component Value Date/Time   COLORURINE ORANGE (A) 04/18/2016 1629   APPEARANCEUR CLOUDY (A) 04/18/2016 1629   LABSPEC 1.012 04/18/2016 1629   PHURINE 6.0 04/18/2016 1629   GLUCOSEU NEGATIVE 04/18/2016 1629   HGBUR NEGATIVE 04/18/2016 1629   BILIRUBINUR LARGE (A) 04/18/2016 1629   BILIRUBINUR small 12/27/2011 1031   KETONESUR 15 (A) 04/18/2016 1629   PROTEINUR NEGATIVE 04/18/2016 1629   UROBILINOGEN 0.2 12/27/2011 1031   NITRITE POSITIVE (A)  04/18/2016 1629   LEUKOCYTESUR TRACE (A) 04/18/2016 1629   Sepsis Labs: @LABRCNTIP (procalcitonin:4,lacticidven:4)   ) Recent Results (from the past 240 hour(s))  MRSA PCR Screening     Status: None   Collection Time: 04/19/16  3:30 AM  Result Value Ref Range Status   MRSA by PCR NEGATIVE NEGATIVE Final      Radiology Studies: US Abdomen Complete Result Date: 04/19/2016 1. Hepatomegaly with  extensive hepatic steatosis. 2. Mild splenomegaly. 3. Small amount of ascites. 4. No acute findings. Normal gallbladder. No bile duct dilation. Pancreas not visualized. Electronically Signed   By: Amie Portland [name].D.   On: 04/19/2016 12:23   Ct Abdomen Pelvis W Contrast Result Date: 04/18/2016 The liver is markedly enlarged and decreased in attenuation most compatible with steatosis and/or associated hepatic edema. There is contour abnormality predominately involving the right hepatic lobe with left hepatic lobe hypertrophy suggestive of cirrhosis. Patchy enhancement throughout the right hepatic lobe is nonspecific and may be secondary to acute hepatitis or perfusional anomaly. Consider dedicated evaluation of the liver in the non acute setting with pre and post contrast-enhanced MRI to exclude the possibility of underlying hepatic lesion. Small amount of perihepatic fluid as well as fluid within the paracolic gutters and pelvis. Nonspecific wall thickening of the small bowel within the central abdomen as well as the proximal colon. These findings may be secondary to portal venous hypertension however acute enteritis is not excluded. Splenomegaly. Electronically Signed   By: Annia Belt [name].D.   On: 04/18/2016 19:14     Scheduled Meds: . allopurinol  100 mg Oral Daily  . feeding supplement (PRO-STAT SUGAR FREE 64)  30 mL Oral TID  . folic acid  1 mg Oral Daily  . furosemide  40 mg Oral Daily  . lactulose  20 g Oral BID  . loratadine  10 mg Oral Daily  . LORazepam  1 mg Intravenous Once  . metoprolol  tartrate  25 mg Oral BID  . multivitamin with minerals  1 tablet Oral Daily  . sodium chloride flush  3 mL Intravenous Q12H  . thiamine  100 mg Oral Daily   Continuous Infusions:     LOS: 3 days    Time spent: 25 minutes  Greater than 50% of the time spent on counseling and coordinating the care.   Manson Passey, MD [facility] Pager [phone]  If 7PM-7AM, please contact night-coverage [email or web address] Password [email or web address] 04/22/2016, 9:06 PM

## 2016-04-23 DIAGNOSIS — D638 Anemia in other chronic diseases classified elsewhere: Secondary | ICD-10-CM

## 2016-04-23 DIAGNOSIS — E872 Acidosis: Secondary | ICD-10-CM

## 2016-04-23 DIAGNOSIS — K7689 Other specified diseases of liver: Secondary | ICD-10-CM

## 2016-04-23 DIAGNOSIS — E538 Deficiency of other specified B group vitamins: Secondary | ICD-10-CM

## 2016-04-23 DIAGNOSIS — F1010 Alcohol abuse, uncomplicated: Secondary | ICD-10-CM

## 2016-04-23 DIAGNOSIS — E876 Hypokalemia: Secondary | ICD-10-CM

## 2016-04-23 DIAGNOSIS — F10120 Alcohol abuse with intoxication, uncomplicated: Secondary | ICD-10-CM

## 2016-04-23 DIAGNOSIS — R7989 Other specified abnormal findings of blood chemistry: Secondary | ICD-10-CM

## 2016-04-23 LAB — COMPREHENSIVE METABOLIC PANEL
ALK PHOS: 169 U/L — AB (ref 38–126)
ALT: 34 U/L (ref 14–54)
ANION GAP: 11 (ref 5–15)
AST: 126 U/L — ABNORMAL HIGH (ref 15–41)
Albumin: 2 g/dL — ABNORMAL LOW (ref 3.5–5.0)
BILIRUBIN TOTAL: 16.4 mg/dL — AB (ref 0.3–1.2)
BUN: 10 mg/dL (ref 6–20)
CALCIUM: 7 mg/dL — AB (ref 8.9–10.3)
CO2: 25 mmol/L (ref 22–32)
Chloride: 100 mmol/L — ABNORMAL LOW (ref 101–111)
GLUCOSE: 113 mg/dL — AB (ref 65–99)
Potassium: 2.7 mmol/L — CL (ref 3.5–5.1)
Sodium: 136 mmol/L (ref 135–145)
TOTAL PROTEIN: 6.1 g/dL — AB (ref 6.5–8.1)

## 2016-04-23 LAB — BASIC METABOLIC PANEL
ANION GAP: 10 (ref 5–15)
BUN: 9 mg/dL (ref 6–20)
CALCIUM: 6.9 mg/dL — AB (ref 8.9–10.3)
CO2: 24 mmol/L (ref 22–32)
Chloride: 100 mmol/L — ABNORMAL LOW (ref 101–111)
Creatinine, Ser: <0.3 mg/dL — ABNORMAL LOW (ref 0.44–1.00)
GLUCOSE: 122 mg/dL — AB (ref 65–99)
Potassium: 3.1 mmol/L — ABNORMAL LOW (ref 3.5–5.1)
SODIUM: 134 mmol/L — AB (ref 135–145)

## 2016-04-23 LAB — MAGNESIUM
MAGNESIUM: 1.7 mg/dL (ref 1.7–2.4)
Magnesium: 1.9 mg/dL (ref 1.7–2.4)

## 2016-04-23 LAB — CBC
HEMATOCRIT: 28.4 % — AB (ref 36.0–46.0)
HEMOGLOBIN: 9.1 g/dL — AB (ref 12.0–15.0)
MCH: 37.6 pg — AB (ref 26.0–34.0)
MCHC: 32 g/dL (ref 30.0–36.0)
MCV: 117.4 fL — ABNORMAL HIGH (ref 78.0–100.0)
Platelets: 171 10*3/uL (ref 150–400)
RBC: 2.42 MIL/uL — ABNORMAL LOW (ref 3.87–5.11)
WBC: 12.2 10*3/uL — ABNORMAL HIGH (ref 4.0–10.5)

## 2016-04-23 MED ORDER — FOLIC ACID 1 MG PO TABS
1.0000 mg | ORAL_TABLET | Freq: Two times a day (BID) | ORAL | Status: DC
  Administered 2016-04-23 – 2016-04-25 (×4): 1 mg via ORAL
  Filled 2016-04-23 (×4): qty 1

## 2016-04-23 MED ORDER — DICLOFENAC SODIUM 1 % TD GEL
2.0000 g | Freq: Four times a day (QID) | TRANSDERMAL | Status: DC
  Administered 2016-04-23 – 2016-04-25 (×7): 2 g via TOPICAL
  Filled 2016-04-23: qty 100

## 2016-04-23 MED ORDER — POTASSIUM CHLORIDE CRYS ER 20 MEQ PO TBCR: 40.0000 meq | EXTENDED_RELEASE_TABLET | Freq: Three times a day (TID) | ORAL | Status: DC

## 2016-04-23 MED ORDER — MAGNESIUM SULFATE 2 GM/50ML IV SOLN
2.0000 g | Freq: Once | INTRAVENOUS | Status: AC
  Administered 2016-04-23: 2 g via INTRAVENOUS
  Filled 2016-04-23: qty 50

## 2016-04-23 MED ORDER — SPIRONOLACTONE 25 MG PO TABS
50.0000 mg | ORAL_TABLET | Freq: Every day | ORAL | Status: DC
  Administered 2016-04-23: 50 mg via ORAL
  Filled 2016-04-23 (×2): qty 2

## 2016-04-23 MED ORDER — LORAZEPAM 0.5 MG PO TABS
0.5000 mg | ORAL_TABLET | Freq: Three times a day (TID) | ORAL | Status: DC | PRN
  Administered 2016-04-23 – 2016-04-25 (×5): 0.5 mg via ORAL
  Filled 2016-04-23 (×6): qty 1

## 2016-04-23 MED ORDER — POTASSIUM CHLORIDE CRYS ER 20 MEQ PO TBCR
40.0000 meq | EXTENDED_RELEASE_TABLET | Freq: Three times a day (TID) | ORAL | Status: AC
  Administered 2016-04-23 (×3): 40 meq via ORAL
  Filled 2016-04-23 (×3): qty 2

## 2016-04-23 NOTE — Significant Event (Signed)
CRITICAL VALUE ALERT  Critical value received: potassium 2.7  Date of notification:  04/23/2016  Time of notification:  0421  Critical value read back:yes  Nurse who received alert:  Jamesetta OrleansWillie bernita Daira Hine,rn  MD notified (1st page): Rollen SoxKatherine Schorr,NP  Time of first page:  0424  MD notified (2nd page):Katherine Schorr,NP  Time of second WUJW:1191page:0524  Responding MD:  Rollen SoxKatherine Schorr,NP  Time MD responded:  [phone]

## 2016-04-23 NOTE — Progress Notes (Signed)
Patient ID: Christine [name], female   DOB: 03/01/1961, 55 y.o.   MRN: [id number]   PROGRESS NOTE    Christine [name]  [id number]:[id number] DOB: 07-17-1961 DOA: 04/18/2016  PCP: [name], MD   Brief Narrative:  55 y.o.female with past medical history of alcohol abuse and alcohol dependence, history of recent falls in last couple of weeks. She presented to ED for further evaluation. Her husband at the bedside reports he is unable to take care of her at home at this point. Patient was hemodynamically stable on the admission. Her blood work was notable for hemoglobin of 8.4, potassium 3.1, lactic acid 6.7 and mild elevation in troponin 0.03. Liver function enzymes were as follows: AST was 249, ALT 54, ALP 279 and bilirubin 10.6. Ammonia level was 43. Alcohol level was 132. CT scan showed evidence of cirrhosis.  Subjective: No nausea, vomiting abdominal pain. Poor appetite. Has pain in both legs which has been going on for a few wks. She desribes it as starting in her hips and going down the "front and sides" of her thighs. No other complaints.   Assessment & Plan:  Acute alcohol intoxication without delirium / Hyperammonemia (HCC) - Alcohol level 132 on the admission - CIWA protocol initiated but stopped 9/18 as pt had no withdrawal- will wean IV Ativan - discussed alcohol cessation   Lactic acidosis - Likely in the setting of end stage liver disease  - improving  Alcoholic hepatitis / Abnormal LFT's - Evidence of hepatic steatosis on CT and Ultrasound- left hepatic lobe cirrhosis on CT - splenomegaly noted as well - minimal ascites on imaging - AST and ALP improving but bilirubin still high which is the last one to trend down - Continue lactulose 20 gm instead of 10 gm- she is having 2-3 BMs/ day  Anemia of chronic disease and folate deficiency - Due to bone marrow suppression from liver cirrhosis - Hemoglobin 7.5 on 9/17 for which she received 1 U PRBC 9/17 - folic acid 2.4- continue  replace - increase to 2 mg daily  Hypokalemia / Hypomagnesemia - Due to poor po intake and Lasix - Continue to supplement potassium and magnesium  Mild troponin elevation - troponin of 0.03  - No reports of chest pain  - 2 D ECHO with normal EF  DVT prophylaxis:  SCD's   Code Status: full code  Family Communication:   Disposition Plan: to SNF in next 24-48 hours    Consultants:   Gastroenterology   Procedures:   None   Antimicrobials:   None   Objective: Vitals:   04/22/16 2110 04/23/16 0558 04/23/16 1002 04/23/16 1558  BP: 117/76 97/80 111/64 103/63  Pulse: (!) 110 95 (!) 102 93  Resp: 18 18  16   Temp: 98.7 F (37.1 C) 98.2 F (36.8 C)  98 F (36.7 C)  TempSrc: Oral Oral  Oral  SpO2: 100% 96%  98%  Weight:      Height:        Intake/Output Summary (Last 24 hours) at 04/23/16 1642 Last data filed at 04/23/16 1530  Gross per 24 hour  Intake              183 ml  Output              950 ml  Net             -767 ml   Filed Weights   04/18/16 1606 04/19/16 0331  Weight: 77.1 kg (170 lb) 78.4  kg (172 lb 13.5 oz)    Examination:  General exam: Appears calm and comfortable; jaundiced skin  Respiratory system: No wheezing, no rhonchi Cardiovascular system: S1 & S2 heard, RRR Gastrointestinal system: (+) BS, distended but not tender abdomen  Central nervous system: Nonfocal  Extremities: No edema, palpable pulses bilaterally  Skin: warm and dry  Psychiatry: Mood and behavior normal    Data Reviewed: I have personally reviewed following labs and imaging studies  CBC:  Recent Labs Lab 04/18/16 1650 04/19/16 0400 04/20/16 0322 04/21/16 0310 04/22/16 0352 04/23/16 0336  WBC 8.0 9.8 9.6 11.8* 10.9* 12.2*  NEUTROABS 6.3  --   --   --   --   --   HGB 8.4* 8.2* 7.5* 9.3* 8.9* 9.1*  HCT 25.4* 24.6* 23.2* 28.3* 27.4* 28.4*  MCV 118.7* 119.4* 122.1* 113.2* 116.1* 117.4*  PLT 203 191 175 192 172 171   Basic Metabolic Panel:  Recent Labs Lab  04/20/16 0322 04/21/16 0310 04/22/16 0352 04/23/16 0336 04/23/16 1523  NA 136 136 137 136 134*  K 2.9* 3.2* 2.7* 2.7* 3.1*  CL 102 101 102 100* 100*  CO2 23 24 24 25 24   GLUCOSE 108* 95 109* 113* 122*  BUN <5* 6 8 10 9   CREATININE <0.30* <0.30* <0.30* <0.30* <0.30*  CALCIUM 6.8* 7.1* 7.0* 7.0* 6.9*  MG  --   --  1.2* 1.7  --    GFR: CrCl cannot be calculated (This lab value cannot be used to calculate CrCl because it is not a number: <0.30). Liver Function Tests:  Recent Labs Lab 04/19/16 0400 04/20/16 0322 04/21/16 0310 04/22/16 0352 04/23/16 0336  AST 234* 183* 168* 140* 126*  ALT 53 43 40 38 34  ALKPHOS 276* 227* 213* 179* 169*  BILITOT 12.6* 13.0* 14.5* 15.0* 16.4*  PROT 6.8 6.4* 6.7 6.2* 6.1*  ALBUMIN 2.3* 2.1* 2.2* 2.0* 2.0*    Recent Labs Lab 04/18/16 1650  LIPASE 19    Recent Labs Lab 04/18/16 1659  AMMONIA 43*   Coagulation Profile:  Recent Labs Lab 04/18/16 1650  INR 1.23   Cardiac Enzymes:  Recent Labs Lab 04/19/16 0400 04/19/16 0935 04/19/16 1528  TROPONINI 0.03* 0.03* <0.03   BNP (last 3 results) No results for input(s): PROBNP in the last 8760 hours. HbA1C: No results for input(s): HGBA1C in the last 72 hours. CBG: No results for input(s): GLUCAP in the last 168 hours. Lipid Profile: No results for input(s): CHOL, HDL, LDLCALC, TRIG, CHOLHDL, LDLDIRECT in the last 72 hours. Thyroid Function Tests: No results for input(s): TSH, T4TOTAL, FREET4, T3FREE, THYROIDAB in the last 72 hours. Anemia Panel: No results for input(s): VITAMINB12, FOLATE, FERRITIN, TIBC, IRON, RETICCTPCT in the last 72 hours. Urine analysis:    Component Value Date/Time   COLORURINE ORANGE (A) 04/18/2016 1629   APPEARANCEUR CLOUDY (A) 04/18/2016 1629   LABSPEC 1.012 04/18/2016 1629   PHURINE 6.0 04/18/2016 1629   GLUCOSEU NEGATIVE 04/18/2016 1629   HGBUR NEGATIVE 04/18/2016 1629   BILIRUBINUR LARGE (A) 04/18/2016 1629   BILIRUBINUR small 12/27/2011  1031   KETONESUR 15 (A) 04/18/2016 1629   PROTEINUR NEGATIVE 04/18/2016 1629   UROBILINOGEN 0.2 12/27/2011 1031   NITRITE POSITIVE (A) 04/18/2016 1629   LEUKOCYTESUR TRACE (A) 04/18/2016 1629   Sepsis Labs: @LABRCNTIP (procalcitonin:4,lacticidven:4)   ) Recent Results (from the past 240 hour(s))  MRSA PCR Screening     Status: None   Collection Time: 04/19/16  3:30 AM  Result Value Ref Range Status  MRSA by PCR NEGATIVE NEGATIVE Final      Radiology Studies: [place] Abdomen Complete Result Date: 04/19/2016 1. Hepatomegaly with extensive hepatic steatosis. 2. Mild splenomegaly. 3. Small amount of ascites. 4. No acute findings. Normal gallbladder. No bile duct dilation. Pancreas not visualized. Electronically Signed   By: Amie Portlandavid  Ormond M.D.   On: 04/19/2016 12:23   Ct Abdomen Pelvis W Contrast Result Date: 04/18/2016 The liver is markedly enlarged and decreased in attenuation most compatible with steatosis and/or associated hepatic edema. There is contour abnormality predominately involving the right hepatic lobe with left hepatic lobe hypertrophy suggestive of cirrhosis. Patchy enhancement throughout the right hepatic lobe is nonspecific and may be secondary to acute hepatitis or perfusional anomaly. Consider dedicated evaluation of the liver in the non acute setting with pre and post contrast-enhanced MRI to exclude the possibility of underlying hepatic lesion. Small amount of perihepatic fluid as well as fluid within the paracolic gutters and pelvis. Nonspecific wall thickening of the small bowel within the central abdomen as well as the proximal colon. These findings may be secondary to portal venous hypertension however acute enteritis is not excluded. Splenomegaly. Electronically Signed   By: Annia Beltrew  Davis M.D.   On: 04/18/2016 19:14     Scheduled Meds: . allopurinol  100 mg Oral Daily  . diclofenac sodium  2 g Topical QID  . feeding supplement (PRO-STAT SUGAR FREE 64)  30 mL Oral TID   . folic acid  1 mg Oral Daily  . furosemide  40 mg Oral Daily  . lactulose  20 g Oral BID  . loratadine  10 mg Oral Daily  . LORazepam  1 mg Intravenous Once  . metoprolol tartrate  25 mg Oral BID  . multivitamin with minerals  1 tablet Oral Daily  . sodium chloride flush  3 mL Intravenous Q12H  . spironolactone  50 mg Oral Daily  . thiamine  100 mg Oral Daily   Continuous Infusions:     LOS: 4 days    Time spent: 25 minutes    Tomasz Steeves, MD [facility] Pager [phone]  If 7PM-7AM, please contact night-coverage [email or web address] Password [email or web address] 04/23/2016, 4:42 PM

## 2016-04-23 NOTE — Plan of Care (Signed)
Problem: Safety: Goal: Ability to remain free from injury will improve Outcome: Progressing [facility]

## 2016-04-23 NOTE — Progress Notes (Signed)
CSW assisting with d/c planning. Pt reviewed in rounds this am. Pt is not yet stable for d/c. [facility] pending BC[facility] authorization. Spouse has been updated. CSW will continue to follow to assist with d/c planning needs.  Cori RazorJamie Cleatis Fandrich LCSW [phone]

## 2016-04-24 DIAGNOSIS — E538 Deficiency of other specified B group vitamins: Secondary | ICD-10-CM

## 2016-04-24 DIAGNOSIS — E669 Obesity, unspecified: Secondary | ICD-10-CM

## 2016-04-24 DIAGNOSIS — E7220 Disorder of urea cycle metabolism, unspecified: Secondary | ICD-10-CM

## 2016-04-24 DIAGNOSIS — K7031 Alcoholic cirrhosis of liver with ascites: Secondary | ICD-10-CM

## 2016-04-24 LAB — COMPREHENSIVE METABOLIC PANEL
ALBUMIN: 1.9 g/dL — AB (ref 3.5–5.0)
ALK PHOS: 157 U/L — AB (ref 38–126)
ALT: 33 U/L (ref 14–54)
ALT: 36 U/L (ref 14–54)
ANION GAP: 8 (ref 5–15)
ANION GAP: 12 (ref 5–15)
AST: 119 U/L — AB (ref 15–41)
AST: 127 U/L — ABNORMAL HIGH (ref 15–41)
Albumin: 1.9 g/dL — ABNORMAL LOW (ref 3.5–5.0)
Alkaline Phosphatase: 174 U/L — ABNORMAL HIGH (ref 38–126)
BILIRUBIN TOTAL: 17 mg/dL — AB (ref 0.3–1.2)
BUN: 10 mg/dL (ref 6–20)
BUN: 10 mg/dL (ref 6–20)
CALCIUM: 7.1 mg/dL — AB (ref 8.9–10.3)
CHLORIDE: 104 mmol/L (ref 101–111)
CO2: 26 mmol/L (ref 22–32)
CO2: 20 mmol/L — ABNORMAL LOW (ref 22–32)
Calcium: 7.3 mg/dL — ABNORMAL LOW (ref 8.9–10.3)
Chloride: 102 mmol/L (ref 101–111)
Creatinine, Ser: <0.3 mg/dL — ABNORMAL LOW (ref 0.44–1.00)
GLUCOSE: 96 mg/dL (ref 65–99)
Glucose, Bld: 162 mg/dL — ABNORMAL HIGH (ref 65–99)
POTASSIUM: 3.6 mmol/L (ref 3.5–5.1)
Potassium: 3.2 mmol/L — ABNORMAL LOW (ref 3.5–5.1)
SODIUM: 136 mmol/L (ref 135–145)
Sodium: 136 mmol/L (ref 135–145)
TOTAL PROTEIN: 6.2 g/dL — AB (ref 6.5–8.1)
TOTAL PROTEIN: 6.4 g/dL — AB (ref 6.5–8.1)
Total Bilirubin: 17.1 mg/dL — ABNORMAL HIGH (ref 0.3–1.2)

## 2016-04-24 LAB — CBC
HEMATOCRIT: 30 % — AB (ref 36.0–46.0)
HEMOGLOBIN: 9.7 g/dL — AB (ref 12.0–15.0)
MCH: 38.2 pg — ABNORMAL HIGH (ref 26.0–34.0)
MCHC: 32.3 g/dL (ref 30.0–36.0)
MCV: 118.1 fL — AB (ref 78.0–100.0)
Platelets: 181 10*3/uL (ref 150–400)
RBC: 2.54 MIL/uL — ABNORMAL LOW (ref 3.87–5.11)
RDW: 26.9 % — AB (ref 11.5–15.5)
WBC: 10.7 10*3/uL — AB (ref 4.0–10.5)

## 2016-04-24 LAB — MAGNESIUM: Magnesium: 1.9 mg/dL (ref 1.7–2.4)

## 2016-04-24 MED ORDER — LACTULOSE 10 GM/15ML PO SOLN: 20.0000 g | Freq: Two times a day (BID) | ORAL | 0 refills | Status: DC

## 2016-04-24 MED ORDER — DICLOFENAC SODIUM 1 % TD GEL: 2.0000 g | Freq: Four times a day (QID) | TRANSDERMAL | Status: DC

## 2016-04-24 MED ORDER — POTASSIUM CHLORIDE CRYS ER 20 MEQ PO TBCR: 40.0000 meq | EXTENDED_RELEASE_TABLET | Freq: Every day | ORAL | Status: DC

## 2016-04-24 MED ORDER — ALPRAZOLAM 0.25 MG PO TABS: 0.5000 mg | ORAL_TABLET | Freq: Two times a day (BID) | ORAL | 0 refills | Status: DC | PRN

## 2016-04-24 MED ORDER — FOLIC ACID 1 MG PO TABS: 1.0000 mg | ORAL_TABLET | Freq: Two times a day (BID) | ORAL | Status: DC

## 2016-04-24 MED ORDER — THIAMINE HCL 100 MG PO TABS: 100.0000 mg | ORAL_TABLET | Freq: Every day | ORAL | Status: AC

## 2016-04-24 MED ORDER — POTASSIUM CHLORIDE CRYS ER 20 MEQ PO TBCR
40.0000 meq | EXTENDED_RELEASE_TABLET | ORAL | Status: AC
  Administered 2016-04-24 (×2): 40 meq via ORAL
  Filled 2016-04-24 (×2): qty 2

## 2016-04-24 MED ORDER — PRO-STAT SUGAR FREE PO LIQD: 30.0000 mL | Freq: Three times a day (TID) | ORAL | 0 refills | Status: DC

## 2016-04-24 MED ORDER — OXYCODONE HCL 5 MG PO TABS: 5.0000 mg | ORAL_TABLET | Freq: Two times a day (BID) | ORAL | 0 refills | Status: DC | PRN

## 2016-04-24 MED ORDER — ADULT MULTIVITAMIN W/MINERALS CH: 1.0000 | ORAL_TABLET | Freq: Every day | ORAL | Status: DC

## 2016-04-24 NOTE — Progress Notes (Addendum)
3:48 PM LCSW is still pending [employer] authorization per [facility]  [facility] to call and follow up with plan and if denied, patient will have to go home. [facility]   LCSW aware of disposition and DC for today as DC summary is available. BCBS Berkley Harveyauth remains pending and was actually denied due to lack of therapy notes, however [facility] and has resubmitted authorization with therapy notes and recommendations.   LCSW has also sent clinicals including DC summary to facility.   LCSW still awaiting authorization and will work with patient on alternative options if SNF is denied due to insurance.  LCSW has called husband: Brett CanalesSteve and discussed barriers at this time with regards to insurance denial and new clinicals sent.  Brett CanalesSteve was given different options if insurance remains to decline and made aware of DC for today. Brett CanalesSteve reports that home would be an option but a very tough and difficult option as she was unable to get around safely and there are stairs leading into home.  He reports she had falls and SNF would be the best if able. He is not able to pay privately at this time. He was given the option of home health and outpatient therapy if having to go home in which he was agreeable too if needed.  LCSW has called [facility] again to verify receipt of clinicals for authorization submission.  Will follow up.  AD has also been called in effort to submit for other options.    Christine EmoryHannah Faylene Allerton LCSW, MSW Clinical Social Work: [employer] Coverage for [employer] [phone]

## 2016-04-24 NOTE — Discharge Summary (Signed)
Physician Discharge Summary  Christine [name]:[id number] DOB: 01-Oct-1960 DOA: 04/18/2016  PCP: [name], MD  Admit date: 04/18/2016 Discharge date: 04/24/2016  Admitted From: home  Disposition:  SNF    Recommendations for Outpatient Follow-up:  1. Please draw Cmet and Magnesium level tomorrow 2. Will be going to SNF     Discharge Condition:  stable   CODE STATUS:  Full code   Diet recommendation:  Heart healthy diet Consultations:  GI    Discharge Diagnoses:  Principal Problem:   Acute alcohol intoxication (HCC) Active Problems:   Alcoholic cirrhosis of liver with ascites (HCC)   Hypokalemia   Abnormal liver function   Anemia of chronic disease   Hyperammonemia (HCC)   Troponin level elevated   Lactic acidosis   Folic acid deficiency   Obesity    Subjective: No nausea, vomiting, abdominal pain. Urinating well and having 2-3 BMs daily. Feels weak and tired today.   Brief Summary: 55 y.o.female with past medical history of alcohol abuse and alcohol dependence, history of recent falls in last couple of weeks. She presented to ED for further evaluation. Her husband at the bedside reports he is unable to take care of her at home at this point. Patient was hemodynamically stable on the admission. Her blood work was notable for hemoglobin of 8.4, potassium 3.1, lactic acid 6.7 and mild elevation in troponin 0.03. Liver function enzymes were as follows: AST was 249, ALT 54, ALP 279 and bilirubin 10.6. Ammonia level was 43. Alcohol level was 132. CT scan showed evidence of cirrhosis.  Hospital Course:  Acute alcohol intoxication without delirium / Hyperammonemia (HCC) - Alcohol level 132 on the admission - CIWA protocol initiated but stopped 9/18 as pt had no withdrawal  - discussed alcohol cessation in detail with patient   Lactic acidosis - Likely due to end stage liver disease  - improving  Alcoholic hepatitis with splenomegaly / Abnormal LFT's - Evidence of  hepatic steatosis on CT and Ultrasound- left hepatic lobe cirrhosis on CT - splenomegaly noted as well - although abdomen appears to be distended, there was minimal ascites on imaging (patient is obese) - AST and ALP improving but bilirubin still high which is the last one to trend down - Continue lactulose 20 gm instead of 10 gm- she is having 2-3 BMs/ day  Anemia of chronic disease and folate deficiency - Due to bone marrow suppression from liver cirrhosis - Hemoglobin 7.5 on 9/17 for which she received 1 U PRBC 9/17 - folic acid 2.4- continue replace - increased to 2 mg daily  Hypokalemia / Hypomagnesemia - Due to poor po intake and Lasix - Continue to supplement potassium and magnesium as needed   Mild troponin elevation - troponin of 0.03  - No reports of chest pain  - 2 D ECHO with normal EF  Gout - cont Allopurinol  HTN? - BP has been low to normal and therefore Metoprolol has been discontinued  Discharge Instructions  Discharge Instructions    Diet - low sodium heart healthy    Complete by:  As directed    Discharge instructions    Complete by:  As directed    Weight QOD and follow for development of ascites   Increase activity slowly    Complete by:  As directed        Medication List    STOP taking these medications   colchicine 0.6 MG tablet   diphenhydrAMINE 25 MG tablet Commonly known as:  BENADRYL  doxycycline 100 MG tablet Commonly known as:  VIBRA-TABS   HYDROcodone-acetaminophen 7.5-325 MG tablet Commonly known as:  NORCO   metoprolol tartrate 25 MG tablet Commonly known as:  LOPRESSOR   oxyCODONE-acetaminophen 5-325 MG tablet Commonly known as:  ROXICET   zolpidem 10 MG tablet Commonly known as:  AMBIEN     TAKE these medications   allopurinol 100 MG tablet Commonly known as:  ZYLOPRIM Take 100 mg by mouth daily.   ALPRAZolam 0.25 MG tablet Commonly known as:  XANAX Take 2 tablets (0.5 mg total) by mouth 2 (two) times daily  as needed for anxiety. What changed:  medication strength  when to take this   cetirizine 10 MG tablet Commonly known as:  ZYRTEC Take 10 mg by mouth as needed. Used nasal congestion.   diclofenac sodium 1 % Gel Commonly known as:  VOLTAREN Apply 2 g topically 4 (four) times daily. Apply to tender area in right groin   feeding supplement (PRO-STAT SUGAR FREE 64) Liqd Take 30 mLs by mouth 3 (three) times daily.   folic acid 1 MG tablet Commonly known as:  FOLVITE Take 1 tablet (1 mg total) by mouth 2 (two) times daily.   furosemide 40 MG tablet Commonly known as:  LASIX Take 40 mg by mouth daily.   lactulose 10 GM/15ML solution Commonly known as:  CHRONULAC Take 30 mLs (20 g total) by mouth 2 (two) times daily.   multivitamin with minerals Tabs tablet Take 1 tablet by mouth daily. Start taking on:  04/25/2016   oxyCODONE 5 MG immediate release tablet Commonly known as:  Oxy IR/ROXICODONE Take 1 tablet (5 mg total) by mouth every 12 (twelve) hours as needed for moderate pain or severe pain.   potassium chloride SA 20 MEQ tablet Commonly known as:  K-DUR,KLOR-CON Take 2 tablets (40 mEq total) by mouth daily.   thiamine 100 MG tablet Take 1 tablet (100 mg total) by mouth daily. Start taking on:  04/25/2016      Contact information for after-discharge care    Destination    [facility] .   Specialty:  Skilled Nursing Facility Contact information: [address] [place] [address] [phone]             Allergies  Allergen Reactions  . Sulfa Antibiotics Rash    Causes nausea.     Procedures/Studies: US Abdomen Complete  Result Date: 04/19/2016 CLINICAL DATA:  Ascites. Patient also reportedly with abdominal distention and jaundice. EXAM: ABDOMEN ULTRASOUND COMPLETE COMPARISON:  CT, 04/18/2016 FINDINGS: Gallbladder: No gallstones or wall thickening visualized. No sonographic Murphy sign noted by sonographer.  Common bile duct: Diameter: 4.3 mm Liver: Enlarged with significant increased parenchymal echogenicity decreasing through transmission of the sound beam limiting evaluation of the posterior aspect of the liver. No convincing liver mass or focal lesion. IVC: Limited visualization.  No gross abnormality. Pancreas: Nonvisualized. Spleen: Measures mildly enlarged with a single largest dimension 15.3 cm and a calculated volume of 763 mL. No splenic mass or focal lesion. Right Kidney: Length: 11.7 cm. Echogenicity within normal limits. No mass or hydronephrosis visualized. Left Kidney: Length: 12.1 cm. Echogenicity within normal limits. No mass or hydronephrosis visualized. Abdominal aorta: No aneurysm visualized. Other findings: Small amount of ascites mostly adjacent to the liver and spleen. IMPRESSION: 1. Hepatomegaly with extensive hepatic steatosis. 2. Mild splenomegaly. 3. Small amount of ascites. 4. No acute findings. Normal gallbladder. No bile duct dilation. Pancreas not visualized. Electronically Signed  By: Amie Portlandavid  Ormond M.D.   On: 04/19/2016 12:23   Ct Abdomen Pelvis W Contrast  Result Date: 04/18/2016 CLINICAL DATA:  Patient with 3 weeks of abdominal distention and generalized fatigue. Jaundice. Patient with history of alcoholism. EXAM: CT ABDOMEN AND PELVIS WITH CONTRAST TECHNIQUE: Multidetector CT imaging of the abdomen and pelvis was performed using the standard protocol following bolus administration of intravenous contrast. CONTRAST:  15mL ISOVUE-300 IOPAMIDOL (ISOVUE-300) INJECTION 61%, 100mL ISOVUE-300 IOPAMIDOL (ISOVUE-300) INJECTION 61% COMPARISON:  None. FINDINGS: Lower chest: Normal heart size. Minimal atelectasis and/or scarring within the left lower lobe and lingula. Small hiatal hernia. Hepatobiliary: The liver is markedly enlarged. The liver is diffusely low in attenuation. There is heterogeneous parenchymal enhancement throughout the right hepatic lobe and hepatic dome. Small amount of  perihepatic fluid predominately about the right hepatic lobe measuring up to 17 mm in thickness (image 32; series 2). Gallbladder is mildly distended. Pancreas: Unremarkable Spleen: Enlarged measuring 16 cm. Adrenals/Urinary Tract: The adrenal glands are normal. Kidneys enhance symmetrically with contrast. No hydronephrosis. Urinary bladder is unremarkable. Stomach/Bowel: No evidence for bowel obstruction. Descending and sigmoid colonic diverticulosis. There is wall thickening of multiple loops of small bowel within the central abdomen. Additionally there is suggestion of wall thickening involving the cecum, ascending and proximal transverse colon. Vascular/Lymphatic: Normal caliber abdominal aorta. No retroperitoneal lymphadenopathy. Peripheral calcified atherosclerotic plaque. Re- cannulization of the periumbilical vein. Reproductive: Uterus and adnexal structures are unremarkable. Other: Small amount of free fluid within the pelvis and paracolic gutters. Musculoskeletal: Lumbar spine degenerative changes. No aggressive or acute appearing osseous lesions. IMPRESSION: The liver is markedly enlarged and decreased in attenuation most compatible with steatosis and/or associated hepatic edema. There is contour abnormality predominately involving the right hepatic lobe with left hepatic lobe hypertrophy suggestive of cirrhosis. Patchy enhancement throughout the right hepatic lobe is nonspecific and may be secondary to acute hepatitis or perfusional anomaly. Consider dedicated evaluation of the liver in the non acute setting with pre and post contrast-enhanced MRI to exclude the possibility of underlying hepatic lesion. Small amount of perihepatic fluid as well as fluid within the paracolic gutters and pelvis. Nonspecific wall thickening of the small bowel within the central abdomen as well as the proximal colon. These findings may be secondary to portal venous hypertension however acute enteritis is not excluded.  Splenomegaly. Electronically Signed   By: Annia Beltrew  Davis M.D.   On: 04/18/2016 19:14       Discharge Exam: Vitals:   04/24/16 0619 04/24/16 1011  BP: (!) 98/58   Pulse: 90 97  Resp: 14   Temp: 98.4 F (36.9 C)    Vitals:   04/23/16 1558 04/23/16 2041 04/24/16 0619 04/24/16 1011  BP: 103/63 110/63 (!) 98/58   Pulse: 93 (!) 101 90 97  Resp: 16 14 14    Temp: 98 F (36.7 C) 98.7 F (37.1 C) 98.4 F (36.9 C)   TempSrc: Oral Oral Oral   SpO2: 98% 99% 95%   Weight:      Height:        General: Pt is alert, awake, not in acute distress- has severe jaundice Cardiovascular: RRR, S1/S2 +, no rubs, no gallops Respiratory: CTA bilaterally, no wheezing, no rhonchi Abdominal: Soft, NT, ND, bowel sounds + Extremities: no edema, no cyanosis    The results of significant diagnostics from this hospitalization (including imaging, microbiology, ancillary and laboratory) are listed below for reference.     Microbiology: Recent Results (from the past 240 hour(s))  MRSA  PCR Screening     Status: None   Collection Time: 04/19/16  3:30 AM  Result Value Ref Range Status   MRSA by PCR NEGATIVE NEGATIVE Final    Comment:        The GeneXpert MRSA Assay (FDA approved for NASAL specimens only), is one component of a comprehensive MRSA colonization surveillance program. It is not intended to diagnose MRSA infection nor to guide or monitor treatment for MRSA infections.      Labs: BNP (last 3 results)  Recent Labs  04/18/16 1659  BNP 118.3*   Basic Metabolic Panel:  Recent Labs Lab 04/21/16 0310 04/22/16 0352 04/23/16 0336 04/23/16 1523 04/23/16 2137 04/24/16 0758  NA 136 137 136 134*  --  136  K 3.2* 2.7* 2.7* 3.1*  --  3.2*  CL 101 102 100* 100*  --  102  CO2 24 24 25 24   --  26  GLUCOSE 95 109* 113* 122*  --  96  BUN 6 8 10 9   --  10  CREATININE <0.30* <0.30* <0.30* <0.30*  --  <0.30*  CALCIUM 7.1* 7.0* 7.0* 6.9*  --  7.1*  MG  --  1.2* 1.7  --  1.9 1.9   Liver  Function Tests:  Recent Labs Lab 04/20/16 0322 04/21/16 0310 04/22/16 0352 04/23/16 0336 04/24/16 0758  AST 183* 168* 140* 126* 119*  ALT 43 40 38 34 33  ALKPHOS 227* 213* 179* 169* 157*  BILITOT 13.0* 14.5* 15.0* 16.4* 17.0*  PROT 6.4* 6.7 6.2* 6.1* 6.2*  ALBUMIN 2.1* 2.2* 2.0* 2.0* 1.9*    Recent Labs Lab 04/18/16 1650  LIPASE 19    Recent Labs Lab 04/18/16 1659  AMMONIA 43*   CBC:  Recent Labs Lab 04/18/16 1650  04/20/16 0322 04/21/16 0310 04/22/16 0352 04/23/16 0336 04/24/16 0758  WBC 8.0  < > 9.6 11.8* 10.9* 12.2* 10.7*  NEUTROABS 6.3  --   --   --   --   --   --   HGB 8.4*  < > 7.5* 9.3* 8.9* 9.1* 9.7*  HCT 25.4*  < > 23.2* 28.3* 27.4* 28.4* 30.0*  MCV 118.7*  < > 122.1* 113.2* 116.1* 117.4* 118.1*  PLT 203  < > 175 192 172 171 181  < > = values in this interval not displayed. Cardiac Enzymes:  Recent Labs Lab 04/19/16 0400 04/19/16 0935 04/19/16 1528  TROPONINI 0.03* 0.03* <0.03   BNP: Invalid input(s): POCBNP CBG: No results for input(s): GLUCAP in the last 168 hours. D-Dimer No results for input(s): DDIMER in the last 72 hours. Hgb A1c No results for input(s): HGBA1C in the last 72 hours. Lipid Profile No results for input(s): CHOL, HDL, LDLCALC, TRIG, CHOLHDL, LDLDIRECT in the last 72 hours. Thyroid function studies No results for input(s): TSH, T4TOTAL, T3FREE, THYROIDAB in the last 72 hours.  Invalid input(s): FREET3 Anemia work up No results for input(s): VITAMINB12, FOLATE, FERRITIN, TIBC, IRON, RETICCTPCT in the last 72 hours. Urinalysis    Component Value Date/Time   COLORURINE ORANGE (A) 04/18/2016 1629   APPEARANCEUR CLOUDY (A) 04/18/2016 1629   LABSPEC 1.012 04/18/2016 1629   PHURINE 6.0 04/18/2016 1629   GLUCOSEU NEGATIVE 04/18/2016 1629   HGBUR NEGATIVE 04/18/2016 1629   BILIRUBINUR LARGE (A) 04/18/2016 1629   BILIRUBINUR small 12/27/2011 1031   KETONESUR 15 (A) 04/18/2016 1629   PROTEINUR NEGATIVE 04/18/2016 1629    UROBILINOGEN 0.2 12/27/2011 1031   NITRITE POSITIVE (A) 04/18/2016 1629   LEUKOCYTESUR  TRACE (A) 04/18/2016 1629   Sepsis Labs Invalid input(s): PROCALCITONIN,  WBC,  LACTICIDVEN Microbiology Recent Results (from the past 240 hour(s))  MRSA PCR Screening     Status: None   Collection Time: 04/19/16  3:30 AM  Result Value Ref Range Status   MRSA by PCR NEGATIVE NEGATIVE Final    Comment:        The GeneXpert MRSA Assay (FDA approved for NASAL specimens only), is one component of a comprehensive MRSA colonization surveillance program. It is not intended to diagnose MRSA infection nor to guide or monitor treatment for MRSA infections.      Time coordinating discharge: Over 30 minutes  SIGNED:   Calvert Cantor, MD  [facility] 04/24/2016, 11:03 AM Pager   If 7PM-7AM, please contact night-coverage [email or web address] Password [email or web address]

## 2016-04-25 NOTE — Progress Notes (Signed)
Report called Ephraim HamburgerWade Lanier RN @ [facility]. Patient to be transported via [facility].

## 2016-04-25 NOTE — Clinical Social Work Placement (Signed)
   CLINICAL SOCIAL WORK PLACEMENT  NOTE  Date:  04/25/2016  Patient Details  Name: Christine [name] MRN: [id number] Date of Birth: [birth year]  Clinical Social Work is seeking post-discharge placement for this patient at the Skilled  Nursing Facility level of care (*CSW will initial, date and re-position this form in  chart as items are completed):  Yes   Patient/family provided with [facility] Clinical Social Work Department's list of facilities offering this level of care within the geographic area requested by the patient (or if unable, by the patient's family).  Yes   Patient/family informed of their freedom to choose among providers that offer the needed level of care, that participate in Medicare, Medicaid or managed care program needed by the patient, have an available bed and are willing to accept the patient.  Yes   Patient/family informed of [facility]'s ownership interest in [facility] and [facility], as well as of the fact that they are under no obligation to receive care at these facilities.  PASRR submitted to EDS on 04/25/16     PASRR number received on 04/25/16     Existing PASRR number confirmed on       FL2 transmitted to all facilities in geographic area requested by pt/family on 04/25/16     FL2 transmitted to all facilities within larger geographic area on       Patient informed that his/her managed care company has contracts with or will negotiate with certain facilities, including the following:  [facility]     Yes   Patient/family informed of bed offers received.  [facility]     Physician recommends and patient chooses bed at [facility]    Patient [facility] on 04/25/16.  Patient to be transferred to facility by EMS     Patient family notified on 04/25/16 of transfer.  Name of family member notified:  Jeannett SeniorStephen her husband      PHYSICIAN Please prepare prescriptions, Please sign FL2     Additional Comment:    _______________________________________________ Raye Sorrowoble, Zvi Duplantis N, LCSW 04/25/2016, 9:08 AM

## 2016-04-25 NOTE — Progress Notes (Addendum)
LCSW has received confirmation that [employer] has approved the SNF disposition.  [name] is awaiting call from representative and will be accepting patient today for SNF.  LCSW has notified MD and patient's husband of plans.  Both in agreement. Husband verbalizes appreciation and relief.  All paperwork was completed on 04/24/16.  No other needs at this time. [facility].  Deretha EmoryHannah Remon Quinto LCSW, MSW Clinical Social Work: Optician, dispensingystem [employer] Coverage: Marijean NiemannJaime [phone]

## 2016-04-27 LAB — BASIC METABOLIC PANEL
BUN: 8 mg/dL (ref 4–21)
CREATININE: 0.5 mg/dL (ref 0.5–1.1)
GLUCOSE: 93 mg/dL
Potassium: 3.6 mmol/L (ref 3.4–5.3)
Sodium: 136 mmol/L — AB (ref 137–147)

## 2016-04-27 LAB — HEPATIC FUNCTION PANEL
ALT: 30 U/L (ref 7–35)
Alkaline Phosphatase: 167 U/L — AB (ref 25–125)
BILIRUBIN, TOTAL: 17.8 mg/dL

## 2016-04-29 ENCOUNTER — Encounter: Payer: Self-pay | Admitting: Internal Medicine

## 2016-04-29 ENCOUNTER — Non-Acute Institutional Stay (SKILLED_NURSING_FACILITY): Payer: BLUE CROSS/BLUE SHIELD | Admitting: Internal Medicine

## 2016-04-29 DIAGNOSIS — M419 Scoliosis, unspecified: Secondary | ICD-10-CM | POA: Diagnosis not present

## 2016-04-29 DIAGNOSIS — M5135 Other intervertebral disc degeneration, thoracolumbar region: Secondary | ICD-10-CM | POA: Diagnosis not present

## 2016-04-29 DIAGNOSIS — K7031 Alcoholic cirrhosis of liver with ascites: Secondary | ICD-10-CM | POA: Diagnosis not present

## 2016-04-29 DIAGNOSIS — R945 Abnormal results of liver function studies: Secondary | ICD-10-CM

## 2016-04-29 DIAGNOSIS — Z8739 Personal history of other diseases of the musculoskeletal system and connective tissue: Secondary | ICD-10-CM

## 2016-04-29 DIAGNOSIS — D638 Anemia in other chronic diseases classified elsewhere: Secondary | ICD-10-CM | POA: Diagnosis not present

## 2016-04-29 DIAGNOSIS — L409 Psoriasis, unspecified: Secondary | ICD-10-CM | POA: Diagnosis not present

## 2016-04-29 DIAGNOSIS — F1010 Alcohol abuse, uncomplicated: Secondary | ICD-10-CM

## 2016-04-29 DIAGNOSIS — K7689 Other specified diseases of liver: Secondary | ICD-10-CM | POA: Diagnosis not present

## 2016-04-29 DIAGNOSIS — M4724 Other spondylosis with radiculopathy, thoracic region: Secondary | ICD-10-CM

## 2016-04-29 DIAGNOSIS — Z8639 Personal history of other endocrine, nutritional and metabolic disease: Secondary | ICD-10-CM

## 2016-04-29 NOTE — Patient Instructions (Signed)
New orders : CBC and differential Uric acid to assess need for allopurinol Pharmacy consult to assess any potentially hepatotoxic agents

## 2016-04-29 NOTE — Progress Notes (Signed)
Facility Location: [facility]  Room Number: 312  Code Status: Full Code   PCP: Lonia BloodGARBA,LAWAL, MD [address] [place] [phone]    This is a comprehensive admission note to [facility] performed on this date less than 30 days from date of admission. Included are preadmission medical/surgical history;reconciled medication list; family history; social history and comprehensive review of systems.  Corrections and additions to the records were documented . Comprehensive physical exam was also performed. Additionally a clinical summary was entered for each active diagnosis pertinent to this admission in the Problem List to enhance continuity of care.  HPI: The patient was hospitalized 9/15-9/21/2017 with acute alcohol intoxication. She presented with recurrent falling in the few weeks prior to admission. This was in the context of alcohol abuse. At the time of admission her alcohol level was 132. Her husband felt that he was unable to care for her at home. Admission she had significant anemia with hemoglobin of 8.4. Potassium was 3.1. Lactic acid was 6.7 was mild elevation of troponin at 0.03. There was dramatic elevation of liver function test with an AST of 249, ALT 54, alkaline phosphatase 279, and total bilirubin 10.6. Ammonia level was 43. Cirrhosis was present on CT. Alcohol withdrawal protocol was initiated but stopped 9/18 as she had no signs of withdrawal. Alcohol cessation was discussed in detail with the patient. Doses was attributed to end-stage liver disease. She was diagnosed with alcoholic hepatitis, splenomegaly was also noted on CT. Lactulose 20 g was initiated [place] 2-3 bowel movements per day. Her anemia was felt to be due to chronic disease and folic acid deficiency. BONE marrow suppression she did receive 1 unit of packed cells 9/17 for hemoglobin of 7.5. Folic acid was increased to 2 mg daily. Despite the mild troponin  elevation she has had no cardiac symptoms. Echo revealed excellent fraction. She has history of gout and allopurinol was continued. The Toprol was discontinued because her blood pressure tended to be low.  Past medical and surgical history: Medical diagnoses include psoriasis, hypertension, gout She has a history of scoliosis for which she had a [place] brace child. She has GERD as well as a hiatal hernia. 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  Social history: Reviewed  Family history: Reviewed  Review of systems: At this time she states that she feels "terrible" due to having stool urgency with loose-watery green stools. She describes balance dysfunction for at least a year. This preceded the increased falling recently prior to admission. The last several days she's had some right mid axillary pain described as a "hot poker" without radiation. This occurs when she reaches. This is in the context of the past history of scoliosis and degenerative disc disease. She does have weakness in the right upper extremity.She has seen an orthopedist for prior humeral head fracture. She describes her urine as being very yellow-orange. She denies any clay-colored stools. She has had some reflux symptoms but denies any other cardiopulmonary symptoms. She has had some weakness in the right foot as well but denies any active radicular symptoms. Constitutional: No fever,significant weight change.  Eyes: No redness, discharge, pain, vision change ENT/mouth: No nasal congestion,  purulent discharge, earache,change in hearing ,sore throat  Cardiovascular: No chest pain, palpitations,paroxysmal nocturnal dyspnea, claudication  Respiratory: No cough, sputum production,hemoptysis, DOE , significant snoring,apnea   Gastrointestinal: No abdominal pain, nausea / vomiting,rectal bleeding, melena Genitourinary: No dysuria,hematuria, pyuria,  incontinence, nocturia Derm:see PMH  Psoriasis  Neurologic: No  seizures, numbness , tingling Psychiatric: PMH depression Endocrine: No change in hair/skin/ nails, excessive thirst, excessive hunger, excessive urination  Hematologic/lymphatic: No significant bruising, lymphadenopathy,abnormal bleeding Allergy/immunology: No itchy/ watery eyes, significant sneezing, urticaria, angioedema.On Zyrtec  Physical exam:  Pertinent or positive findings: She has scleral icterus and jaundice. Tremors noted of the upper extremities. Resting tachycardia is noted. She has minor rales in homogenous distribution. Abdomen is distended. Exfoliative dermatitis is present in the shins, right greater than left. She has 1+ edema at the sock line. Decreased range of motion the right upper extremity superiorly. There is asymmetry of the paraspinous musculature in the inferior thoraco-lumbar area, right greater than left. General appearance:Adequately nourished; no acute distress , increased work of breathing is present.   Lymphatic: No lymphadenopathy about the head, neck, axilla . Eyes: No conjunctival inflammation or lid edema is present.  Ears:  External ear exam shows no significant lesions or deformities.   Nose:  External nasal examination shows no deformity or inflammation. Nasal mucosa are pink and moist without lesions ,exudates Oral exam: lips and gums are healthy appearing.There is no oropharyngeal erythema or exudate . Neck:  No thyromegaly, masses, tenderness noted.    Heart:   regular rhythm. S1 and S2 normal without murmur, click, rub .  Abdomen:Bowel sounds are decreased. Abdomen is nontender with no palpable organomegaly, hernias,masses. GU: deferred  Extremities:  No cyanosis, clubbing  Neurologic exam : Strength equal  in upper & lower extremities but decreased Balance,Rhomberg,finger to nose testing could not be completed due to clinical state Deep tendon reflexes are equal Skin: Warm & dry w/o tenting. No significant rash.  See  clinical summary under each active problem in the Problem List with associated updated therapeutic plan

## 2016-04-29 NOTE — Assessment & Plan Note (Signed)
CBC

## 2016-04-29 NOTE — Assessment & Plan Note (Signed)
Check uric acid to assess need for allopurinol 

## 2016-04-29 NOTE — Assessment & Plan Note (Signed)
Involvement in Alcoholics Anonymous discussed with the patient. She appeared reticent to pursue this therapeutic option.

## 2016-04-29 NOTE — Assessment & Plan Note (Signed)
LFTs trending down with delay in decrease in the total bilirubins as expected

## 2016-04-29 NOTE — Assessment & Plan Note (Signed)
04/29/16 midaxillary line sharp pain with position change most likely related to positional nerve impingement. Physical therapy the safest option context of the advanced liver disease

## 2016-04-29 NOTE — Assessment & Plan Note (Signed)
Avoid the immunosuppressant Humira in her present state Follow-up as an outpatient with dermatology

## 2016-04-29 NOTE — Assessment & Plan Note (Addendum)
Liver enzymes are trending down. As expected the total bilirubin decline is delayed. Present total bilirubin 17.8. Focus will be on eliminating any hepatotoxic agents in her regimen. Lactulose will be continued with a goal of 2-3 bowel movements per day

## 2016-05-02 ENCOUNTER — Emergency Department (HOSPITAL_COMMUNITY): Payer: BLUE CROSS/BLUE SHIELD

## 2016-05-02 ENCOUNTER — Encounter (HOSPITAL_COMMUNITY): Payer: Self-pay | Admitting: Vascular Surgery

## 2016-05-02 ENCOUNTER — Non-Acute Institutional Stay (SKILLED_NURSING_FACILITY): Payer: BLUE CROSS/BLUE SHIELD | Admitting: Nurse Practitioner

## 2016-05-02 ENCOUNTER — Encounter: Payer: Self-pay | Admitting: Nurse Practitioner

## 2016-05-02 ENCOUNTER — Emergency Department (HOSPITAL_COMMUNITY)
Admission: EM | Admit: 2016-05-02 | Discharge: 2016-05-03 | Disposition: A | Discharge status: Skilled Nursing Facility | Payer: BLUE CROSS/BLUE SHIELD | Attending: Emergency Medicine | Admitting: Emergency Medicine

## 2016-05-02 DIAGNOSIS — Z87891 Personal history of nicotine dependence: Secondary | ICD-10-CM | POA: Insufficient documentation

## 2016-05-02 DIAGNOSIS — R601 Generalized edema: Secondary | ICD-10-CM | POA: Diagnosis not present

## 2016-05-02 DIAGNOSIS — Z79899 Other long term (current) drug therapy: Secondary | ICD-10-CM | POA: Insufficient documentation

## 2016-05-02 DIAGNOSIS — Z8739 Personal history of other diseases of the musculoskeletal system and connective tissue: Secondary | ICD-10-CM

## 2016-05-02 DIAGNOSIS — R000 Tachycardia, unspecified: Secondary | ICD-10-CM | POA: Diagnosis not present

## 2016-05-02 DIAGNOSIS — Z8639 Personal history of other endocrine, nutritional and metabolic disease: Secondary | ICD-10-CM

## 2016-05-02 DIAGNOSIS — M25571 Pain in right ankle and joints of right foot: Secondary | ICD-10-CM

## 2016-05-02 DIAGNOSIS — R109 Unspecified abdominal pain: Secondary | ICD-10-CM | POA: Diagnosis present

## 2016-05-02 DIAGNOSIS — R17 Unspecified jaundice: Secondary | ICD-10-CM | POA: Diagnosis not present

## 2016-05-02 DIAGNOSIS — I10 Essential (primary) hypertension: Secondary | ICD-10-CM | POA: Diagnosis not present

## 2016-05-02 DIAGNOSIS — R0602 Shortness of breath: Secondary | ICD-10-CM | POA: Diagnosis not present

## 2016-05-02 LAB — CBC AND DIFFERENTIAL
HCT: 28 % — AB (ref 36–46)
HEMOGLOBIN: 9.6 g/dL — AB (ref 12.0–16.0)
PLATELETS: 262 10*3/uL (ref 150–399)
WBC: 11 10^3/mL

## 2016-05-02 NOTE — ED Notes (Signed)
To xray at this time.

## 2016-05-02 NOTE — ED Triage Notes (Signed)
Pt reports to the ED for eval of abd pain. [facility]. Pt has hx of cirrhosis and jaundice as well as a hernia. Pt denies any N/V. She has chronic diarrhea r/t her prescribed lactulose. Pt A&O, resp e/u, and skin warm and dry. She appears jaundiced.

## 2016-05-02 NOTE — Progress Notes (Signed)
Nursing Home Location:  [facility]   Place of Service: SNF (31)  PCP: [name], MD  Allergies  Allergen Reactions  . Sulfa Antibiotics Rash    Causes nausea.    Chief Complaint  Patient presents with  . Acute Visit    Bilateral lower leg edema and left ankle pain    HPI:  Patient is a 55 y.o. female seen today at [facility] due to leg swelling and right ankle pain. Pt with hx of ETOH abuse and dependence, chronic pain, anemia, htn and gout.  Pt with increase pain to right ankle over the last few days. No injury noted, questions gout. Uric acid level at 3.3 this week.  Pt with bilateral LE edema. No increase in localized swelling, heat or bruising to ankle.   Review of Systems:  Review of Systems  Constitutional: Positive for fatigue. Negative for activity change, appetite change and unexpected weight change.  Eyes: Negative.   Respiratory: Negative for cough and shortness of breath.   Cardiovascular: Positive for palpitations and leg swelling. Negative for chest pain.  Genitourinary: Negative for difficulty urinating and dysuria.  Musculoskeletal: Positive for arthralgias, back pain, gait problem and myalgias.  Skin: Positive for color change (jaundice ). Negative for wound.  Neurological: Positive for weakness. Negative for dizziness.  Hematological: Does not bruise/bleed easily.  Psychiatric/Behavioral: Negative for agitation, behavioral problems and confusion.       Hx of depression    Past Medical History:  Diagnosis Date  . Abnormal Pap smear    displasia had colposcopy  . Anemia   . Arthritis   . Closed right humeral fracture    Humeral head fracture  . Depression    not currently on meds  . GERD (gastroesophageal reflux disease)   . Gout   . Hernia   . Hypertension   . Psoriasis    Past Surgical History:  Procedure Laterality Date  . COLPOSCOPY    . DILATION AND CURETTAGE OF UTERUS     Social History:   reports that she quit  smoking about 2 weeks ago. Her smoking use included Cigarettes. She has a 15.00 pack-year smoking history. She has never used smokeless tobacco. She reports that she does not drink alcohol or use drugs.  Family History  Problem Relation Age of Onset  . Hypertension Mother   . Diabetes Mother   . Hypertension Father   . Diabetes Brother     Medications: Patient's Medications  New Prescriptions   No medications on file  Previous Medications   ALLOPURINOL (ZYLOPRIM) 100 MG TABLET    Take 100 mg by mouth daily.   ALPRAZOLAM (XANAX) 0.25 MG TABLET    Take 2 tablets (0.5 mg total) by mouth 2 (two) times daily as needed for anxiety.   AMINO ACIDS-PROTEIN HYDROLYS (FEEDING SUPPLEMENT, PRO-STAT SUGAR FREE 64,) LIQD    Take 30 mLs by mouth 3 (three) times daily.   CETIRIZINE (ZYRTEC) 10 MG TABLET    Take 10 mg by mouth as needed. Used nasal congestion.   DICLOFENAC SODIUM (VOLTAREN) 1 % GEL    Apply 2 g topically 4 (four) times daily. Apply to tender area in right groin   FOLIC ACID (FOLVITE) 1 MG TABLET    Take 1 tablet (1 mg total) by mouth 2 (two) times daily.   FUROSEMIDE (LASIX) 40 MG TABLET    Take 40 mg by mouth daily.   LACTULOSE (CHRONULAC) 10 GM/15ML SOLUTION    Take  30 mLs (20 g total) by mouth 2 (two) times daily.   MULTIPLE VITAMIN (MULTIVITAMIN WITH MINERALS) TABS TABLET    Take 1 tablet by mouth daily.   OXYCODONE (OXY IR/ROXICODONE) 5 MG IMMEDIATE RELEASE TABLET    Take 1 tablet (5 mg total) by mouth every 12 (twelve) hours as needed for moderate pain or severe pain.   POTASSIUM CHLORIDE SA (K-DUR,KLOR-CON) 20 MEQ TABLET    Take 2 tablets (40 mEq total) by mouth daily.   RANITIDINE (ZANTAC) 150 MG CAPSULE    Take 150 mg by mouth 2 (two) times daily. Stop date: 05/29/16   THIAMINE 100 MG TABLET    Take 1 tablet (100 mg total) by mouth daily.  Modified Medications   No medications on file  Discontinued Medications   No medications on file     Physical Exam: Vitals:   05/02/16  1124  BP: 133/69  Pulse: (!) 110  Resp: 20  Temp: 97.7 F (36.5 C)  TempSrc: Oral  Weight: 177 lb 9.6 oz (80.6 kg)  Height: 5\' 4"  (1.626 m)    Physical Exam  Constitutional: She is oriented to person, place, and time. She appears well-developed and well-nourished. No distress.  HENT:  Head: Normocephalic and atraumatic.  Mouth/Throat: Oropharynx is clear and moist. No oropharyngeal exudate.  Neck: Neck supple.  Cardiovascular: Regular rhythm and normal heart sounds.  Tachycardia present.   Pulmonary/Chest: Effort normal and breath sounds normal.  Abdominal: Soft. Bowel sounds are normal. She exhibits distension.  Musculoskeletal: Normal range of motion. She exhibits edema (2+ pitting edema) and tenderness (to right ankle).  Normal ROM and equal strength to right and left leg  Neurological: She is alert and oriented to person, place, and time.  Skin: Skin is warm and dry. She is not diaphoretic.  Psychiatric: She has a normal mood and affect.    Labs reviewed: Basic Metabolic Panel:  Recent Labs  16/10/96 0336 04/23/16 1523 04/23/16 2137 04/24/16 0758 04/24/16 1748 04/27/16  NA 136 134*  --  136 136 136*  K 2.7* 3.1*  --  3.2* 3.6 3.6  CL 100* 100*  --  102 104  --   CO2 25 24  --  26 20*  --   GLUCOSE 113* 122*  --  96 162*  --   BUN 10 9  --  10 10 8   CREATININE <0.30* <0.30*  --  <0.30* <0.30* 0.5  CALCIUM 7.0* 6.9*  --  7.1* 7.3*  --   MG 1.7  --  1.9 1.9  --   --    Liver Function Tests:  Recent Labs  04/23/16 0336 04/24/16 0758 04/24/16 1748 04/27/16  AST 126* 119* 127*  --   ALT 34 33 36 30  ALKPHOS 169* 157* 174* 167*  BILITOT 16.4* 17.0* 17.1*  --   PROT 6.1* 6.2* 6.4*  --   ALBUMIN 2.0* 1.9* 1.9*  --     Recent Labs  04/18/16 1650  LIPASE 19    Recent Labs  04/18/16 1659  AMMONIA 43*   CBC:  Recent Labs  04/18/16 1650  04/22/16 0352 04/23/16 0336 04/24/16 0758 05/02/16  WBC 8.0  < > 10.9* 12.2* 10.7* 11.0  NEUTROABS 6.3  --    --   --   --   --   HGB 8.4*  < > 8.9* 9.1* 9.7* 9.6*  HCT 25.4*  < > 27.4* 28.4* 30.0* 28*  MCV 118.7*  < > 116.1* 117.4*  118.1*  --   PLT 203  < > 172 171 181 262  < > = values in this interval not displayed. TSH:  Recent Labs  04/19/16 0400  TSH 0.977   A1C: No results found for: HGBA1C Lipid Panel: No results for input(s): CHOL, HDL, LDLCALC, TRIG, CHOLHDL, LDLDIRECT in the last 8760 hours.   Uric Acid 05/02/16: 3.3   Assessment/Plan 1. History of gout Uric acid of 3.3, prior level drawn in 2015 of 10 on allopurinol, do not feel like ankle pain is related to gout  2. Generalized edema Due to poor nutrition, low albumin, cirrhoisis of the liver -pt also adding salt to food -NAS diet -to elevate LE as tolerates and wear TED hose  3. Tachycardia Staff to obtain EKG, and CMP -cbc reviewed and stable  4. Ankle pain -acute onset, will follow up xray of right ankle, no acute injury -cont PT as tolerates    Clancy Leiner K. Biagio BorgEubanks, AGNP  [facility] [phone](Monday-Friday 8 am - 5 pm) [phone] (after hours)

## 2016-05-02 NOTE — ED Provider Notes (Signed)
[facility]-EMERGENCY DEPT Provider Note   CSN: [id number] Arrival date & time: 05/02/16  2139     History   Chief Complaint Chief Complaint  Patient presents with  . Abdominal Pain    HPI Christine [name] is a 55 y.o. female.  The history is provided by the patient and medical records.  Abdominal Pain   Associated symptoms include diarrhea.    55 year old female with history of anemia, arthritis, depression, GERD, hypertension, psoriasis, cirrhosis, presenting to the ED for abdominal pain. Patient was admitted to the hospital earlier this month for similar. States upon discharge she was feeling somewhat better and her color had improved, however over the past 3 days her husband reports she has been in more pain. States she has once again began to appear more yellow in color. Patient reports she feels more "pressure" in her abdomen again. States she feels that it is causing her to have some shortness of breath. She denies any chest pain. No fevers. States she also has started having some loose stool that is green in color. No melena or hematochezia. States she has been taking her medications as directed.  Past Medical History:  Diagnosis Date  . Abnormal Pap smear    displasia had colposcopy  . Anemia   . Arthritis   . Closed right humeral fracture    Humeral head fracture  . Depression    not currently on meds  . GERD (gastroesophageal reflux disease)   . Gout   . Hernia   . Hypertension   . Psoriasis     Patient Active Problem List   Diagnosis Date Noted  . Scoliosis 04/29/2016  . History of gout 04/29/2016  . Psoriasis 04/29/2016  . Folic acid deficiency 04/24/2016  . Obesity 04/24/2016  . Hypokalemia 04/19/2016  . Alcohol abuse 04/19/2016  . Abnormal liver function 04/19/2016  . Acute alcohol intoxication (HCC) 04/19/2016  . Alcoholic cirrhosis of liver with ascites (HCC) 04/19/2016  . Anemia of chronic disease 04/19/2016  . Hyperammonemia (HCC) 04/19/2016  .  Troponin level elevated 04/19/2016  . Lactic acidosis 04/19/2016    Past Surgical History:  Procedure Laterality Date  . COLPOSCOPY    . DILATION AND CURETTAGE OF UTERUS      OB History    Gravida Para Term Preterm AB Living   1       1 0   SAB TAB Ectopic Multiple Live Births                   Home Medications    Prior to Admission medications   Medication Sig Start Date End Date Taking? Authorizing Provider  allopurinol (ZYLOPRIM) 100 MG tablet Take 100 mg by mouth daily.    Historical Provider, MD  ALPRAZolam Prudy Feeler(XANAX) 0.25 MG tablet Take 2 tablets (0.5 mg total) by mouth 2 (two) times daily as needed for anxiety. 04/24/16   Calvert CantorSaima Rizwan, MD  Amino Acids-Protein Hydrolys (FEEDING SUPPLEMENT, PRO-STAT SUGAR FREE 64,) LIQD Take 30 mLs by mouth 3 (three) times daily. 04/24/16   Calvert CantorSaima Rizwan, MD  cetirizine (ZYRTEC) 10 MG tablet Take 10 mg by mouth as needed. Used nasal congestion.    Historical Provider, MD  diclofenac sodium (VOLTAREN) 1 % GEL Apply 2 g topically 4 (four) times daily. Apply to tender area in right groin 04/24/16   Calvert CantorSaima Rizwan, MD  folic acid (FOLVITE) 1 MG tablet Take 1 tablet (1 mg total) by mouth 2 (two) times daily. 04/24/16   Saima  Rizwan, MD  furosemide (LASIX) 40 MG tablet Take 40 mg by mouth daily.    Historical Provider, MD  lactulose (CHRONULAC) 10 GM/15ML solution Take 30 mLs (20 g total) by mouth 2 (two) times daily. 04/24/16   Calvert Cantor, MD  Multiple Vitamin (MULTIVITAMIN WITH MINERALS) TABS tablet Take 1 tablet by mouth daily. 04/25/16   Calvert Cantor, MD  oxyCODONE (OXY IR/ROXICODONE) 5 MG immediate release tablet Take 1 tablet (5 mg total) by mouth every 12 (twelve) hours as needed for moderate pain or severe pain. 04/24/16   Calvert Cantor, MD  potassium chloride SA (K-DUR,KLOR-CON) 20 MEQ tablet Take 2 tablets (40 mEq total) by mouth daily. 04/24/16   Calvert Cantor, MD  ranitidine (ZANTAC) 150 MG capsule Take 150 mg by mouth 2 (two) times daily. Stop date:  05/29/16    Historical Provider, MD  thiamine 100 MG tablet Take 1 tablet (100 mg total) by mouth daily. 04/25/16   Calvert Cantor, MD    Family History Family History  Problem Relation Age of Onset  . Hypertension Mother   . Diabetes Mother   . Hypertension Father   . Diabetes Brother     Social History Social History  Substance Use Topics  . Smoking status: Former Smoker    Packs/day: 1.00    Years: 15.00    Types: Cigarettes    Quit date: 04/18/2016  . Smokeless tobacco: Never Used     Comment: Admitted to the hospital 04/18/16  . Alcohol use No     Comment: couple glasses liquor day     Allergies   Sulfa antibiotics   Review of Systems Review of Systems  Gastrointestinal: Positive for abdominal pain and diarrhea.  All other systems reviewed and are negative.    Physical Exam Updated Vital Signs BP 108/56   Pulse 101   Temp 99.5 F (37.5 C) (Oral)   Resp 18   SpO2 98%   Physical Exam  Constitutional: She is oriented to person, place, and time. She appears well-developed and well-nourished.  Jaundiced  HENT:  Head: Normocephalic and atraumatic.  Mouth/Throat: Oropharynx is clear and moist.  Eyes: Conjunctivae and EOM are normal. Pupils are equal, round, and reactive to light. Scleral icterus is present.  Neck: Normal range of motion.  Cardiovascular: Normal rate, regular rhythm and normal heart sounds.   Pulmonary/Chest: Effort normal and breath sounds normal.  Abdominal: Soft. Bowel sounds are normal. There is hepatosplenomegaly. There is no tenderness.  distention with hepatosplenomegaly noted, no significant ascites or fluid wave  Musculoskeletal: Normal range of motion.  Pitting edema of bilateral ankles, R > L Psoriasis noted of the legs No calf swelling or tenderness  Neurological: She is alert and oriented to person, place, and time.  Awake, alert, fully oriented, does not appear lethargic, normal motor skills  Skin: Skin is warm and dry. No  bruising, no ecchymosis and no petechiae noted.  Psychiatric: She has a normal mood and affect.  Nursing note and vitals reviewed.    ED Treatments / Results  Labs (all labs ordered are listed, but only abnormal results are displayed) Labs Reviewed  CBC WITH DIFFERENTIAL/PLATELET - Abnormal; Notable for the following:       Result Value   WBC 11.8 (*)    RBC 2.92 (*)    Hemoglobin 10.7 (*)    HCT 33.8 (*)    MCV 115.8 (*)    MCH 36.6 (*)    RDW 20.6 (*)    Neutro  Abs 9.3 (*)    Monocytes Absolute 1.1 (*)    All other components within normal limits  COMPREHENSIVE METABOLIC PANEL - Abnormal; Notable for the following:    Potassium 3.1 (*)    CO2 21 (*)    Glucose, Bld 105 (*)    Calcium 8.0 (*)    Albumin 1.7 (*)    AST 134 (*)    Alkaline Phosphatase 144 (*)    Total Bilirubin 19.7 (*)    All other components within normal limits  AMMONIA - Abnormal; Notable for the following:    Ammonia 38 (*)    All other components within normal limits  ACETAMINOPHEN LEVEL - Abnormal; Notable for the following:    Acetaminophen (Tylenol), Serum <10 (*)    All other components within normal limits  URINALYSIS, ROUTINE W REFLEX MICROSCOPIC (NOT AT [facility]) - Abnormal; Notable for the following:    Color, Urine ORANGE (*)    APPearance CLOUDY (*)    Bilirubin Urine LARGE (*)    Ketones, ur 15 (*)    Nitrite POSITIVE (*)    Leukocytes, UA SMALL (*)    All other components within normal limits  URINE MICROSCOPIC-ADD ON - Abnormal; Notable for the following:    Squamous Epithelial / LPF 0-5 (*)    Bacteria, UA FEW (*)    Casts HYALINE CASTS (*)    All other components within normal limits  URINE CULTURE  LIPASE, BLOOD  BRAIN NATRIURETIC PEPTIDE  ETHANOL  I-STAT TROPOININ, ED    EKG  EKG Interpretation None       Radiology Dg Abdomen Acute W/chest  Result Date: 05/02/2016 CLINICAL DATA:  Subacute onset of generalized abdominal pain and distention. Lower extremity swelling.  Initial encounter. EXAM: DG ABDOMEN ACUTE W/ 1V CHEST COMPARISON:  CT abdomen and pelvis performed 04/18/2016, and chest radiograph performed 10/20/2005 FINDINGS: The lungs are well-aerated. Mild vascular congestion is noted. There is no evidence of focal opacification, pleural effusion or pneumothorax. The cardiomediastinal silhouette is within normal limits. The visualized bowel gas pattern is unremarkable. Scattered stool and air are seen within the colon; there is no evidence of small bowel dilatation to suggest obstruction. No free intra-abdominal air is identified on the provided decubitus view. No acute osseous abnormalities are seen; the sacroiliac joints are unremarkable in appearance. IMPRESSION: 1. Unremarkable bowel gas pattern; no free intra-abdominal air seen. Small to moderate amount of stool noted in the colon. 2. Mild vascular congestion noted.  Lungs remain grossly clear. Electronically Signed   By: Roanna Raider M.D.   On: 05/02/2016 23:19    Procedures Procedures (including critical care time)  Medications Ordered in ED Medications - No data to display   Initial Impression / Assessment and Plan / ED Course  I have reviewed the triage vital signs and the nursing notes.  Pertinent labs & imaging results that were available during my care of the patient were reviewed by me and considered in my medical decision making (see chart for details).  Clinical Course   55 year old female here with abdominal pain. Recent admission for the same. She has severe cirrhosis of the liver. Patient is afebrile and nontoxic in appearance. Her abdomen is distended which is chronic. There is hepatosplenomegaly noted. No ascites or fluid wave. Patient is not had any nausea or vomiting. She has had some loose stools. No melena or hematochezia. No urinary symptoms. Labwork obtained, bilirubin has elevated somewhat from prior, however on review this seems to have been  trending up over the past several  months. Remainder of her labs overall appear unchanged from prior, somewhat improved actually. UA is nitrite positive, however few bacteria. There is large bili noted in her urine which may be obscuring results as patient is asymptomatic of this. Culture sent.  Acute abdominal series without any signs of obstruction or free air.   Patient remains at her baseline here. She is not confused and does not appear toxic in any way.  She has not had any active bleeding or easy bruising.  States she has not had any alcohol recently, has stopped taking percocet.  After discussion with attending physician, Dr. Rush Landmark, and other ER colleagues, I am not sure what benefit admission would offer the patient as her problems are well known and chronic in nature and there does not appear to be any acute process/change from prior admission.  I have discussed this with patient and her husband, they seem to understand this.  I have recommended that she follow-up with her gastroenterologist, Dr. [name].  Continue to avoid alcohol and tylenol.  Return precautions given for new or worsening symptoms.  Final Clinical Impressions(s) / ED Diagnoses   Final diagnoses:  Elevated bilirubin  Abdominal pain, unspecified abdominal location    New Prescriptions Discharge Medication List as of 05/03/2016  1:48 AM       Garlon Hatchet, PA-C 05/03/16 1610    Canary Brim Tegeler, MD 05/04/16 [phone]

## 2016-05-03 LAB — COMPREHENSIVE METABOLIC PANEL
ALBUMIN: 1.7 g/dL — AB (ref 3.5–5.0)
ALK PHOS: 144 U/L — AB (ref 38–126)
ALT: 38 U/L (ref 14–54)
ANION GAP: 9 (ref 5–15)
AST: 134 U/L — ABNORMAL HIGH (ref 15–41)
BUN: 9 mg/dL (ref 6–20)
CALCIUM: 8 mg/dL — AB (ref 8.9–10.3)
CHLORIDE: 105 mmol/L (ref 101–111)
CO2: 21 mmol/L — AB (ref 22–32)
Creatinine, Ser: 0.61 mg/dL (ref 0.44–1.00)
GFR calc non Af Amer: >60 mL/min (ref >60)
GLUCOSE: 105 mg/dL — AB (ref 65–99)
Potassium: 3.1 mmol/L — ABNORMAL LOW (ref 3.5–5.1)
SODIUM: 135 mmol/L (ref 135–145)
Total Bilirubin: 19.7 mg/dL (ref 0.3–1.2)
Total Protein: 6.8 g/dL (ref 6.5–8.1)

## 2016-05-03 LAB — CBC WITH DIFFERENTIAL/PLATELET
BASOS ABS: 0 10*3/uL (ref 0.0–0.1)
Basophils Relative: 0 %
Eosinophils Absolute: 0.1 10*3/uL (ref 0.0–0.7)
Eosinophils Relative: 1 %
HCT: 33.8 % — ABNORMAL LOW (ref 36.0–46.0)
HEMOGLOBIN: 10.7 g/dL — AB (ref 12.0–15.0)
LYMPHS ABS: 1.3 10*3/uL (ref 0.7–4.0)
Lymphocytes Relative: 11 %
MCH: 36.6 pg — AB (ref 26.0–34.0)
MCHC: 31.7 g/dL (ref 30.0–36.0)
MCV: 115.8 fL — ABNORMAL HIGH (ref 78.0–100.0)
MONO ABS: 1.1 10*3/uL — AB (ref 0.1–1.0)
MONOS PCT: 9 %
NEUTROS ABS: 9.3 10*3/uL — AB (ref 1.7–7.7)
Neutrophils Relative %: 79 %
PLATELETS: 259 10*3/uL (ref 150–400)
RBC: 2.92 MIL/uL — ABNORMAL LOW (ref 3.87–5.11)
RDW: 20.6 % — ABNORMAL HIGH (ref 11.5–15.5)
WBC: 11.8 10*3/uL — AB (ref 4.0–10.5)

## 2016-05-03 LAB — URINALYSIS, ROUTINE W REFLEX MICROSCOPIC
Glucose, UA: NEGATIVE mg/dL
HGB URINE DIPSTICK: NEGATIVE
Ketones, ur: 15 mg/dL — AB
Nitrite: POSITIVE — AB
PROTEIN: NEGATIVE mg/dL
Specific Gravity, Urine: 1.027 (ref 1.005–1.030)
pH: 6 (ref 5.0–8.0)

## 2016-05-03 LAB — ETHANOL

## 2016-05-03 LAB — BRAIN NATRIURETIC PEPTIDE: B Natriuretic Peptide: 76.3 pg/mL (ref 0.0–100.0)

## 2016-05-03 LAB — LIPASE, BLOOD: Lipase: 18 U/L (ref 11–51)

## 2016-05-03 LAB — URINE MICROSCOPIC-ADD ON: RBC / HPF: NONE SEEN RBC/hpf (ref 0–5)

## 2016-05-03 LAB — I-STAT TROPONIN, ED: TROPONIN I, POC: 0 ng/mL (ref 0.00–0.08)

## 2016-05-03 LAB — ACETAMINOPHEN LEVEL

## 2016-05-03 LAB — AMMONIA: Ammonia: 38 umol/L — ABNORMAL HIGH (ref 9–35)

## 2016-05-03 NOTE — Discharge Instructions (Signed)
Recommend to follow-up closely with Dr. Loreta AveMann. Avoid tylenol and alcohol. Return here for new concerns.

## 2016-05-05 LAB — URINE CULTURE

## 2016-05-06 ENCOUNTER — Telehealth (HOSPITAL_BASED_OUTPATIENT_CLINIC_OR_DEPARTMENT_OTHER): Payer: Self-pay | Admitting: Emergency Medicine

## 2016-05-06 NOTE — Telephone Encounter (Signed)
Post ED Visit - Positive Culture Follow-up  Culture report reviewed by antimicrobial stewardship pharmacist:  []  Enzo BiNathan Batchelder, Pharm.D. []  Celedonio MiyamotoJeremy Frens, Pharm.D., BCPS []  Garvin FilaMike Maccia, Pharm.D. []  Georgina PillionElizabeth Martin, Pharm.D., BCPS []  UnionMinh Pham, [address]Ph[address]D., BCPS, AAHIVP []  Estella HuskMichelle Turner, Pharm.D., BCPS, AAHIVP []  Tennis Mustassie Stewart, Pharm.D. []  Sherle Poeob Vincent, [address]Pharm[address]  Casilda Carlsaylor Stone PharmD  Positive urine culture Treated with none, asymptomatic, no further patient follow-up is required at this time.  Berle MullMiller, Daran Favaro 05/06/2016, 9:41 AM

## 2016-05-08 ENCOUNTER — Non-Acute Institutional Stay (SKILLED_NURSING_FACILITY): Payer: BLUE CROSS/BLUE SHIELD | Admitting: Internal Medicine

## 2016-05-08 ENCOUNTER — Encounter: Payer: Self-pay | Admitting: Internal Medicine

## 2016-05-08 DIAGNOSIS — K7031 Alcoholic cirrhosis of liver with ascites: Secondary | ICD-10-CM | POA: Diagnosis not present

## 2016-05-08 DIAGNOSIS — Z8739 Personal history of other diseases of the musculoskeletal system and connective tissue: Secondary | ICD-10-CM | POA: Diagnosis not present

## 2016-05-08 DIAGNOSIS — E876 Hypokalemia: Secondary | ICD-10-CM | POA: Diagnosis not present

## 2016-05-08 NOTE — Progress Notes (Signed)
This is a nursing facility follow up for specific issue of progressive ascites.  Interim medical record and care since last [facility] visit was updated with review of diagnostic studies and change in clinical status since last visit were documented.  HPI: PT/OT is concerned because of increasing abdominal girth. Her weight is up 3% over the last 8 days.  She was seen 05/02/16 in the emergency room for abdominal discomfort and ascites in the context of advanced cirrhosis. There was mild elevation of AST and total bilirubin but otherwise labs were essentially stable.Abdominal series revealed no ileus or free air. Urine did reveal nitrite positive and few bacteria. Culture revealed 60,000 colonies of Escherichia coli, resistant to ampicillin. She was felt to be at her baseline w/o confusion or toxicity; follow-up with Dr.Mann ,Gastroenterology was recommended for progressive symptoms.  She has had gout in the past. Uric acid has been as high as 10. She's presently on allopurinol 100 mg daily and uric acid is 3.3 which should be protective against gout. She states that she had taken ibuprofen at home for musculoskeletal complaints despite admonition from her gastroenterologist not to do so.  Review of systems:  She feels "rotten". This is mainly due to diffuse musculoskeletal discomfort and progressive abdominal  swelling. She describes some discomfort in her ribs when she tries to mobilize. She also has pain in the coccyx when she sits for a period of time. The feet are sore but this is not like the gout attack she's had in the past. Additionally she's noted edema of the right hand which she feels may have been related to blood draw attempts. Despite the edema she denies any significant cardiopulmonary symptoms.  Constitutional: No fever Eyes: No redness, discharge, pain, vision change ENT/mouth: No nasal congestion,  purulent discharge, earache,change in hearing ,sore throat    Cardiovascular: No chest pain, palpitations,paroxysmal nocturnal dyspnea, claudication  Respiratory: No cough, sputum production,hemoptysis, DOE , significant snoring,apnea   Gastrointestinal: No heartburn,dysphagia,nausea / vomiting,rectal bleeding, melena. With the lactulose she's having at least 2 bowel movements a day Genitourinary: No dysuria,hematuria, pyuria,  incontinence, nocturia (see C&S results above) Dermatologic: No rash, pruritus Neurologic: No dizziness,headache,syncope, seizures, numbness , tingling Psychiatric: No significant anxiety , depression, insomnia, anorexia Endocrine: No change in hair/skin/ nails, excessive thirst, excessive hunger, excessive urination  Hematologic/lymphatic: No significant bruising, lymphadenopathy,abnormal bleeding Allergy/immunology: No itchy/ watery eyes, significant sneezing, urticaria, angioedema  Physical exam:  Pertinent or positive findings: She has scleral icterus as well as jaundice. Gallop type cadence is present. Chest is surprisingly clear. Abdomen is distended and quiet but nontender. She has 1+ pedal edema despite wearing support hose. Pulses are decreased. She has slight tenting.  She is oriented 3.  General appearance:Adequately nourished; no acute distress , increased work of breathing is present.   Lymphatic: No lymphadenopathy about the head, neck, axilla . Eyes: No conjunctival inflammation or lid edema is present. There is no scleral icterus. Ears:  External ear exam shows no significant lesions or deformities.   Nose:  External nasal examination shows no deformity or inflammation. Nasal mucosa are pink and moist without lesions ,exudates Oral exam: lips and gums are healthy appearing.There is no oropharyngeal erythema or exudate . Neck:  No thyromegaly, masses, tenderness noted.    Heart:  Normal rate and regular rhythm. S1 and S2 normal without  murmur, click, rub .  Lungs:Chest clear to auscultation without wheezes,  rhonchi,rales , rubs. Abdomen: Abdomen is soft and nontender with no  organomegaly, hernias,masses. GU: deferred  Extremities:  No cyanosis, clubbing  Neurologic exam : Strength equal  in upper & lower extremities decreased Deep tendon reflexes are equal Skin: Warm & dry . No significant lesions or rash.    See summary under each active problem in the Problem List with associated updated therapeutic plan

## 2016-05-08 NOTE — Patient Instructions (Addendum)
Spironolactone 25 mg twice a day with titration as indicated clinically. She is on potassium supplement but her potassium was 3.109/29. Be met will be rechecked in 5 days

## 2016-05-08 NOTE — Assessment & Plan Note (Signed)
The musculoskeletal complaints are nonspecific and not suggestive of gout. Additionally it would be very unlikely to have an exacerbation of gout with uric acid of 3.3. Low-dose allopurinol will be continued.

## 2016-05-08 NOTE — Assessment & Plan Note (Signed)
Hepatic enzymes are slightly elevated not significantly so. Spironolactone will be initiated and titrated. According to the medical literature dose can be up to 400 mg daily. If there is suboptimal response Lasix could be titrated to 160 mg daily. The latter will be avoided initially because of potential risk of inducing hepatic decompensation with rapid diuresis

## 2016-05-13 LAB — BASIC METABOLIC PANEL
BUN: 23 mg/dL — AB (ref 4–21)
Creatinine: 1 mg/dL (ref 0.5–1.1)
Glucose: 110 mg/dL
POTASSIUM: 3.4 mmol/L (ref 3.4–5.3)
Sodium: 129 mmol/L — AB (ref 137–147)

## 2016-05-14 ENCOUNTER — Encounter: Payer: Self-pay | Admitting: Nurse Practitioner

## 2016-05-14 ENCOUNTER — Non-Acute Institutional Stay (SKILLED_NURSING_FACILITY): Payer: BLUE CROSS/BLUE SHIELD | Admitting: Nurse Practitioner

## 2016-05-14 DIAGNOSIS — K7031 Alcoholic cirrhosis of liver with ascites: Secondary | ICD-10-CM | POA: Diagnosis not present

## 2016-05-14 DIAGNOSIS — R601 Generalized edema: Secondary | ICD-10-CM

## 2016-05-14 NOTE — Progress Notes (Signed)
Nursing Home Location:  [facility]   Place of Service: SNF (31)  PCP: Lonia BloodGARBA,LAWAL, MD  Allergies  Allergen Reactions  . Sulfa Antibiotics Rash    Causes nausea.    Chief Complaint  Patient presents with  . Acute Visit    Fluid Retention    HPI:  Patient is a 55 y.o. female seen [facility] at [facility] due fluid retention and reported weight gain from staff . Pt with hx of ETOH abuse and dependence, chronic pain, anemia, htn and gout.  Pt was seen on 05/08/16 due to progressive ascites and spironolactone 25 mg BID was added, however pt only getting daily. In the last few days staff reports increase swelling to legs, abdomen and even arms.  Pt reports increase shortness of breath and hard time to moving due to increase abdominal girth.  She is still participating in therapy Reports getting up every 30 mins to urinate.  Denies cough, congestion, chest pains, palpitations.   Review of Systems:  Review of Systems  Constitutional: Positive for fatigue. Negative for activity change, appetite change and unexpected weight change.  Eyes: Negative.   Respiratory: Negative for cough and shortness of breath.   Cardiovascular: Positive for palpitations and leg swelling. Negative for chest pain.  Genitourinary: Negative for difficulty urinating and dysuria.  Musculoskeletal: Positive for arthralgias, back pain, gait problem and myalgias.  Skin: Positive for color change (jaundice ). Negative for wound.  Neurological: Positive for weakness. Negative for dizziness.  Hematological: Does not bruise/bleed easily.  Psychiatric/Behavioral: Negative for agitation, behavioral problems and confusion.       Hx of depression    Past Medical History:  Diagnosis Date  . Abnormal Pap smear    displasia had colposcopy  . Anemia   . Arthritis   . Closed right humeral fracture    Humeral head fracture  . Depression    not currently on meds  . GERD (gastroesophageal reflux disease)     . Gout   . Hernia   . Hypertension   . Psoriasis    Past Surgical History:  Procedure Laterality Date  . COLPOSCOPY    . DILATION AND CURETTAGE OF UTERUS     Social History:   reports that she quit smoking about 3 weeks ago. Her smoking use included Cigarettes. She has a 15.00 pack-year smoking history. She has never used smokeless tobacco. She reports that she does not drink alcohol or use drugs.  Family History  Problem Relation Age of Onset  . Hypertension Mother   . Diabetes Mother   . Hypertension Father   . Diabetes Brother     Medications: Patient's Medications  New Prescriptions   No medications on file  Previous Medications   ALLOPURINOL (ZYLOPRIM) 100 MG TABLET    Take 100 mg by mouth daily.   ALPRAZOLAM (XANAX) 0.5 MG TABLET    Take one tablet by mouth every 12 hours as needed for anxiety   ALUM & MAG HYDROXIDE-SIMETH (MAALOX/MYLANTA) 200-200-20 MG/5ML SUSPENSION    Take 30 mLs by mouth every 2 (two) hours as needed for indigestion or heartburn.   AMINO ACIDS-PROTEIN HYDROLYS (FEEDING SUPPLEMENT, PRO-STAT SUGAR FREE 64,) LIQD    Take 30 mLs by mouth 3 (three) times daily.   BISACODYL (DULCOLAX) 10 MG SUPPOSITORY    Place 10 mg rectally daily as needed for moderate constipation.   CETIRIZINE (ZYRTEC) 10 MG TABLET    Take 10 mg by mouth daily as needed for allergies.  Used nasal congestion.    DICLOFENAC SODIUM (VOLTAREN) 1 % GEL    Apply 2 g topically 4 (four) times daily. Apply to tender area in right groin   FOLIC ACID (FOLVITE) 1 MG TABLET    Take 1 tablet (1 mg total) by mouth 2 (two) times daily.   FUROSEMIDE (LASIX) 40 MG TABLET    Take 40 mg by mouth daily.   LACTULOSE (CHRONULAC) 10 GM/15ML SOLUTION    Take 30 mLs (20 g total) by mouth 2 (two) times daily.   MAGNESIUM HYDROXIDE (MILK OF MAGNESIA) 400 MG/5ML SUSPENSION    Take 30 mLs by mouth daily as needed for mild constipation.   MULTIPLE VITAMIN (MULTIVITAMIN WITH MINERALS) TABS TABLET    Take 1 tablet by  mouth daily.   OXYCODONE (OXY IR/ROXICODONE) 5 MG IMMEDIATE RELEASE TABLET    Take 1 tablet (5 mg total) by mouth every 12 (twelve) hours as needed for moderate pain or severe pain.   POTASSIUM CHLORIDE SA (K-DUR,KLOR-CON) 20 MEQ TABLET    Take 2 tablets (40 mEq total) by mouth daily.   RANITIDINE (ZANTAC) 150 MG CAPSULE    Take 150 mg by mouth 2 (two) times daily. Stop date: 05/29/16   SODIUM PHOSPHATES (RA SALINE ENEMA RE)    Place 1 Applicatorful rectally daily as needed (constipation).   SPIRONOLACTONE (ALDACTONE) 25 MG TABLET    Take 1 tablet (25 mg total) by mouth 2 (two) times daily.   THIAMINE 100 MG TABLET    Take 1 tablet (100 mg total) by mouth daily.  Modified Medications   No medications on file  Discontinued Medications   No medications on file     Physical Exam: Vitals:   05/14/16 1106  BP: 113/66  Pulse: 100  Resp: 20  Temp: 98 F (36.7 C)  SpO2: 95%  Weight: 190 lb 6.4 oz (86.4 kg)  Height: 5\' 4"  (1.626 m)    Physical Exam  Constitutional: She is oriented to person, place, and time. She appears well-developed and well-nourished. No distress.  HENT:  Head: Normocephalic and atraumatic.  Mouth/Throat: Oropharynx is clear and moist. No oropharyngeal exudate.  Neck: Neck supple.  Cardiovascular: Regular rhythm and normal heart sounds.  Tachycardia present.   HR 105  Pulmonary/Chest: Effort normal.  Crackles noted to right lower lobe  Abdominal: Soft. Bowel sounds are normal. She exhibits distension.  Abdomen distended and firm  Musculoskeletal: Normal range of motion. She exhibits edema and tenderness (to right ankle).  3+ pitting edema to bilateral extremities to abdomen.  Pitting edema to bilateral arms R>L  Neurological: She is alert and oriented to person, place, and time.  Skin: Skin is warm and dry. She is not diaphoretic.  Skin is jaundice   Psychiatric: She has a normal mood and affect.    Labs reviewed: Basic Metabolic Panel:  Recent Labs   04/23/16 0336  04/23/16 2137 04/24/16 0758 04/24/16 1748 04/27/16 05/03/16 0019  NA 136  < >  --  136 136 136* 135  K 2.7*  < >  --  3.2* 3.6 3.6 3.1*  CL 100*  < >  --  102 104  --  105  CO2 25  < >  --  26 20*  --  21*  GLUCOSE 113*  < >  --  96 162*  --  105*  BUN 10  < >  --  10 10 8 9   CREATININE <0.30*  < >  --  <0.30* <  0.30* 0.5 0.61  CALCIUM 7.0*  < >  --  7.1* 7.3*  --  8.0*  MG 1.7  --  1.9 1.9  --   --   --   < > = values in this interval not displayed. Liver Function Tests:  Recent Labs  04/24/16 0758 04/24/16 1748 04/27/16 05/03/16 0019  AST 119* 127*  --  134*  ALT 33 36 30 38  ALKPHOS 157* 174* 167* 144*  BILITOT 17.0* 17.1*  --  19.7*  PROT 6.2* 6.4*  --  6.8  ALBUMIN 1.9* 1.9*  --  1.7*    Recent Labs  04/18/16 1650 05/03/16 0019  LIPASE 19 18    Recent Labs  04/18/16 1659 05/03/16 0019  AMMONIA 43* 38*   CBC:  Recent Labs  04/18/16 1650  04/23/16 0336 04/24/16 0758 05/02/16 05/03/16 0019  WBC 8.0  < > 12.2* 10.7* 11.0 11.8*  NEUTROABS 6.3  --   --   --   --  9.3*  HGB 8.4*  < > 9.1* 9.7* 9.6* 10.7*  HCT 25.4*  < > 28.4* 30.0* 28* 33.8*  MCV 118.7*  < > 117.4* 118.1*  --  115.8*  PLT 203  < > 171 181 262 259  < > = values in this interval not displayed. TSH:  Recent Labs  04/19/16 0400  TSH 0.977   A1C: No results found for: HGBA1C Lipid Panel: No results for input(s): CHOL, HDL, LDLCALC, TRIG, CHOLHDL, LDLDIRECT in the last 8760 hours.   Uric Acid 05/02/16: 3.3   Assessment/Plan 1. Alcoholic cirrhosis of liver with ascites (HCC) Progressive swelling noted, Spironolactone has only been given daily, will increase to BID per previous order and increase lasix 40 mg in am and 20 mg in PM for increase diuresis -will get abdominal US for ascites  -CMP in AM -staff to weigh daily -VS q shift -notify for changes  2. Generalized edema See number 1  Jaanai Salemi K. Biagio Borg  [facility] [phone] 8 am - 5 pm) [phone] (after hours)

## 2016-05-15 ENCOUNTER — Other Ambulatory Visit (HOSPITAL_COMMUNITY): Payer: Self-pay | Admitting: Internal Medicine

## 2016-05-15 DIAGNOSIS — R188 Other ascites: Secondary | ICD-10-CM

## 2016-05-16 LAB — BASIC METABOLIC PANEL
BUN: 30 mg/dL — AB (ref 4–21)
CREATININE: 0.9 mg/dL (ref 0.5–1.1)
GLUCOSE: 113 mg/dL
POTASSIUM: 3.7 mmol/L (ref 3.4–5.3)
SODIUM: 128 mmol/L — AB (ref 137–147)

## 2016-05-22 ENCOUNTER — Emergency Department (HOSPITAL_BASED_OUTPATIENT_CLINIC_OR_DEPARTMENT_OTHER)
Admit: 2016-05-22 | Discharge: 2016-05-22 | Disposition: A | Discharge status: Home / Self Care | Payer: BLUE CROSS/BLUE SHIELD | Attending: Emergency Medicine | Admitting: Emergency Medicine

## 2016-05-22 ENCOUNTER — Emergency Department (HOSPITAL_COMMUNITY): Payer: BLUE CROSS/BLUE SHIELD

## 2016-05-22 ENCOUNTER — Encounter (HOSPITAL_COMMUNITY): Payer: Self-pay | Admitting: Emergency Medicine

## 2016-05-22 ENCOUNTER — Emergency Department (HOSPITAL_COMMUNITY)
Admission: EM | Admit: 2016-05-22 | Discharge: 2016-05-22 | Disposition: A | Discharge status: Home / Self Care | Payer: BLUE CROSS/BLUE SHIELD | Attending: Emergency Medicine | Admitting: Emergency Medicine

## 2016-05-22 DIAGNOSIS — Z87891 Personal history of nicotine dependence: Secondary | ICD-10-CM | POA: Insufficient documentation

## 2016-05-22 DIAGNOSIS — I10 Essential (primary) hypertension: Secondary | ICD-10-CM | POA: Diagnosis not present

## 2016-05-22 DIAGNOSIS — R0602 Shortness of breath: Secondary | ICD-10-CM | POA: Insufficient documentation

## 2016-05-22 DIAGNOSIS — Z79899 Other long term (current) drug therapy: Secondary | ICD-10-CM | POA: Diagnosis not present

## 2016-05-22 DIAGNOSIS — M7989 Other specified soft tissue disorders: Secondary | ICD-10-CM | POA: Diagnosis not present

## 2016-05-22 DIAGNOSIS — K7031 Alcoholic cirrhosis of liver with ascites: Secondary | ICD-10-CM | POA: Insufficient documentation

## 2016-05-22 DIAGNOSIS — R109 Unspecified abdominal pain: Secondary | ICD-10-CM | POA: Diagnosis present

## 2016-05-22 DIAGNOSIS — R079 Chest pain, unspecified: Secondary | ICD-10-CM | POA: Diagnosis not present

## 2016-05-22 LAB — COMPREHENSIVE METABOLIC PANEL
ALK PHOS: 210 U/L — AB (ref 38–126)
ALT: 63 U/L — ABNORMAL HIGH (ref 14–54)
ANION GAP: 10 (ref 5–15)
AST: 196 U/L — ABNORMAL HIGH (ref 15–41)
Albumin: 2 g/dL — ABNORMAL LOW (ref 3.5–5.0)
BUN: 28 mg/dL — ABNORMAL HIGH (ref 6–20)
CALCIUM: 7.9 mg/dL — AB (ref 8.9–10.3)
CO2: 19 mmol/L — AB (ref 22–32)
CREATININE: 0.72 mg/dL (ref 0.44–1.00)
Chloride: 104 mmol/L (ref 101–111)
Glucose, Bld: 105 mg/dL — ABNORMAL HIGH (ref 65–99)
Potassium: 3.6 mmol/L (ref 3.5–5.1)
SODIUM: 133 mmol/L — AB (ref 135–145)
TOTAL PROTEIN: 6.3 g/dL — AB (ref 6.5–8.1)
Total Bilirubin: 12.9 mg/dL — ABNORMAL HIGH (ref 0.3–1.2)

## 2016-05-22 LAB — CBC
HCT: 31.3 % — ABNORMAL LOW (ref 36.0–46.0)
HEMOGLOBIN: 10.6 g/dL — AB (ref 12.0–15.0)
MCH: 33.7 pg (ref 26.0–34.0)
MCHC: 33.9 g/dL (ref 30.0–36.0)
MCV: 99.4 fL (ref 78.0–100.0)
PLATELETS: 184 10*3/uL (ref 150–400)
RBC: 3.15 MIL/uL — AB (ref 3.87–5.11)
RDW: 17.2 % — ABNORMAL HIGH (ref 11.5–15.5)
WBC: 13.7 10*3/uL — AB (ref 4.0–10.5)

## 2016-05-22 LAB — URINALYSIS, ROUTINE W REFLEX MICROSCOPIC
Glucose, UA: NEGATIVE mg/dL
Hgb urine dipstick: NEGATIVE
Ketones, ur: NEGATIVE mg/dL
NITRITE: POSITIVE — AB
PROTEIN: NEGATIVE mg/dL
SPECIFIC GRAVITY, URINE: 1.011 (ref 1.005–1.030)
pH: 5.5 (ref 5.0–8.0)

## 2016-05-22 LAB — URINE MICROSCOPIC-ADD ON

## 2016-05-22 LAB — I-STAT TROPONIN, ED: TROPONIN I, POC: 0 ng/mL (ref 0.00–0.08)

## 2016-05-22 LAB — PROTIME-INR
INR: 1.58
Prothrombin Time: 19 seconds — ABNORMAL HIGH (ref 11.4–15.2)

## 2016-05-22 LAB — LIPASE, BLOOD: Lipase: 19 U/L (ref 11–51)

## 2016-05-22 NOTE — ED Provider Notes (Signed)
[facility]-EMERGENCY DEPT Provider Note   CSN: [id number] Arrival date & time: 05/22/16  1234     History   Chief Complaint Chief Complaint  Patient presents with  . Abdominal Pain    HPI Christine [name] is a 55 y.o. female.  HPI Patient presents with abdominal pain. Acute on chronic. Was reportedly sent in by her gastroenterologist, Dr. [name], for paracentesis. She was reportedly not able to get it as an outpatient. Patient does however have likely ascites. History of cirrhosis. Abdomen is been getting more swollen. She's also been having more swelling on her arms and legs. Some shortness of breath. Some occasional chest pain. No fevers. States she feels bad all over. The abdominal pain is dull. Past Medical History:  Diagnosis Date  . Abnormal Pap smear    displasia had colposcopy  . Anemia   . Arthritis   . Closed right humeral fracture    Humeral head fracture  . Depression    not currently on meds  . GERD (gastroesophageal reflux disease)   . Gout   . Hernia   . Hypertension   . Psoriasis     Patient Active Problem List   Diagnosis Date Noted  . Scoliosis 04/29/2016  . History of gout 04/29/2016  . Psoriasis 04/29/2016  . Folic acid deficiency 04/24/2016  . Obesity 04/24/2016  . Hypokalemia 04/19/2016  . Alcohol abuse 04/19/2016  . Abnormal liver function 04/19/2016  . Acute alcohol intoxication (HCC) 04/19/2016  . Alcoholic cirrhosis of liver with ascites (HCC) 04/19/2016  . Anemia of chronic disease 04/19/2016  . Hyperammonemia (HCC) 04/19/2016  . Troponin level elevated 04/19/2016  . Lactic acidosis 04/19/2016    Past Surgical History:  Procedure Laterality Date  . COLPOSCOPY    . DILATION AND CURETTAGE OF UTERUS      OB History    Gravida Para Term Preterm AB Living   1       1 0   SAB TAB Ectopic Multiple Live Births                   Home Medications    Prior to Admission medications   Medication Sig Start Date End Date Taking? Authorizing  Provider  ALPRAZolam Prudy Feeler) 0.5 MG tablet Take one tablet by mouth every 12 hours as needed for anxiety   Yes Historical Provider, MD  alum & mag hydroxide-simeth (MAALOX/MYLANTA) 200-200-20 MG/5ML suspension Take 30 mLs by mouth every 2 (two) hours as needed for indigestion or heartburn.   Yes Historical Provider, MD  Amino Acids-Protein Hydrolys (FEEDING SUPPLEMENT, PRO-STAT SUGAR FREE 64,) LIQD Take 30 mLs by mouth 3 (three) times daily. 04/24/16  Yes Calvert Cantor, MD  bisacodyl (DULCOLAX) 10 MG suppository Place 10 mg rectally daily as needed for moderate constipation.   Yes Historical Provider, MD  diclofenac sodium (VOLTAREN) 1 % GEL Apply 2 g topically 4 (four) times daily. Apply to tender area in right groin 04/24/16  Yes Calvert Cantor, MD  folic acid (FOLVITE) 1 MG tablet Take 1 tablet (1 mg total) by mouth 2 (two) times daily. 04/24/16  Yes Calvert Cantor, MD  furosemide (LASIX) 20 MG tablet Take 20 mg by mouth every evening.   Yes Historical Provider, MD  furosemide (LASIX) 40 MG tablet Take 40 mg by mouth daily.   Yes Historical Provider, MD  Iron Carbonyl-Vitamin C-FOS (CHEWABLE IRON PO) Take 1 each by mouth daily.   Yes Historical Provider, MD  lactulose (CHRONULAC) 10 GM/15ML solution Take  30 mLs (20 g total) by mouth 2 (two) times daily. 04/24/16  Yes Calvert Cantor, MD  magnesium hydroxide (MILK OF MAGNESIA) 400 MG/5ML suspension Take 30 mLs by mouth daily as needed for mild constipation.   Yes Historical Provider, MD  oxyCODONE (OXY IR/ROXICODONE) 5 MG immediate release tablet Take 1 tablet (5 mg total) by mouth every 12 (twelve) hours as needed for moderate pain or severe pain. 04/24/16  Yes Calvert Cantor, MD  potassium chloride SA (K-DUR,KLOR-CON) 20 MEQ tablet Take 2 tablets (40 mEq total) by mouth daily. 04/24/16  Yes Calvert Cantor, MD  ranitidine (ZANTAC) 150 MG capsule Take 150 mg by mouth 2 (two) times daily. Stop date: 05/29/16   Yes Historical Provider, MD  spironolactone (ALDACTONE) 25  MG tablet Take 1 tablet (25 mg total) by mouth 2 (two) times daily. 05/08/16  Yes Pecola Lawless, MD  thiamine 100 MG tablet Take 1 tablet (100 mg total) by mouth daily. 04/25/16  Yes Calvert Cantor, MD  traZODone (DESYREL) 100 MG tablet Take 100 mg by mouth at bedtime.   Yes Historical Provider, MD  traZODone (DESYREL) 50 MG tablet Take 25 mg by mouth 2 (two) times daily. 0800 and 1400   Yes Historical Provider, MD  Multiple Vitamin (MULTIVITAMIN WITH MINERALS) TABS tablet Take 1 tablet by mouth daily. Patient not taking: Reported on 05/22/2016 04/25/16   Calvert Cantor, MD    Family History Family History  Problem Relation Age of Onset  . Hypertension Mother   . Diabetes Mother   . Hypertension Father   . Diabetes Brother     Social History Social History  Substance Use Topics  . Smoking status: Former Smoker    Packs/day: 1.00    Years: 15.00    Types: Cigarettes    Quit date: 04/18/2016  . Smokeless tobacco: Never Used     Comment: Admitted to the hospital 04/18/16  . Alcohol use No     Comment: couple glasses liquor day     Allergies   Ibuprofen and Sulfa antibiotics   Review of Systems Review of Systems  Constitutional: Positive for appetite change.  HENT: Negative for congestion and hearing loss.   Respiratory: Positive for shortness of breath.   Cardiovascular: Positive for chest pain.  Gastrointestinal: Positive for abdominal distention and abdominal pain. Negative for diarrhea, nausea and vomiting.  Genitourinary: Negative for dyspareunia.  Musculoskeletal: Negative for gait problem.  Skin: Positive for color change.  Neurological: Negative for dizziness.  Hematological: Negative for adenopathy.  Psychiatric/Behavioral: Negative for confusion.     Physical Exam Updated Vital Signs BP (!) 98/49 (BP Location: Right Arm)   Pulse 105   Temp 98 F (36.7 C) (Oral)   Resp 18   SpO2 100%   Physical Exam  Constitutional: She appears well-developed.  HENT:    Head: Atraumatic.  Cardiovascular:  Mild tachycardia  Abdominal: She exhibits distension. There is tenderness.  Patient with hepatomegaly. Some edema to abdomen. Does have distention with fluid wave.  Musculoskeletal: She exhibits edema.  Moderate edema to bilateral lower extremities. Anasarca does go up to abdomen. Has swelling of right hand but not nearly as much of the left hand. States she's had previous falls and Procrit arm on that side.  Neurological: She is alert.  Skin:  Patient is diffusely jaundiced.  Psychiatric: She has a normal mood and affect.     ED Treatments / Results  Labs (all labs ordered are listed, but only abnormal results are displayed)  Labs Reviewed  COMPREHENSIVE METABOLIC PANEL - Abnormal; Notable for the following:       Result Value   Sodium 133 (*)    CO2 19 (*)    Glucose, Bld 105 (*)    BUN 28 (*)    Calcium 7.9 (*)    Total Protein 6.3 (*)    Albumin 2.0 (*)    AST 196 (*)    ALT 63 (*)    Alkaline Phosphatase 210 (*)    Total Bilirubin 12.9 (*)    All other components within normal limits  CBC - Abnormal; Notable for the following:    WBC 13.7 (*)    RBC 3.15 (*)    Hemoglobin 10.6 (*)    HCT 31.3 (*)    RDW 17.2 (*)    All other components within normal limits  URINALYSIS, ROUTINE W REFLEX MICROSCOPIC (NOT AT [facility]) - Abnormal; Notable for the following:    Color, Urine AMBER (*)    APPearance CLOUDY (*)    Bilirubin Urine MODERATE (*)    Nitrite POSITIVE (*)    Leukocytes, UA TRACE (*)    All other components within normal limits  PROTIME-INR - Abnormal; Notable for the following:    Prothrombin Time 19.0 (*)    All other components within normal limits  URINE MICROSCOPIC-ADD ON - Abnormal; Notable for the following:    Squamous Epithelial / LPF 0-5 (*)    Bacteria, UA MANY (*)    All other components within normal limits  LIPASE, BLOOD  I-STAT TROPOININ, ED    EKG  EKG Interpretation  Date/Time:  Thursday May 22 2016 12:44:49 EDT Ventricular Rate:  99 PR Interval:    QRS Duration: 125 QT Interval:  365 QTC Calculation: 469 R Axis:   54 Text Interpretation:  Sinus tachycardia IVCD, consider atypical RBBB Baseline wander in lead(s) I III aVL baseline artifact Confirmed by Rubin Payor  MD, Harrold Donath [phone]) on 05/22/2016 4:24:07 PM       Radiology Dg Chest 2 View  Result Date: 05/22/2016 CLINICAL DATA:  Abdominal swelling, history of cirrhosis, jaundice, chest pain EXAM: CHEST  2 VIEW COMPARISON:  Chest x-ray of 05/02/2016 and 10/20/2005 FINDINGS: There is linear opacity at the left lung base which is somewhat was present on the chest x-ray of 05/02/2016. This is most consistent with atelectasis or scarring with pneumonia less likely. No pleural effusion is seen. The right lung is clear. Mediastinal and hilar contours are unremarkable. The heart is within normal limits in size. No bony abnormality is seen. IMPRESSION: Probable linear atelectasis or scarring at the left lung base. No other abnormality. Electronically Signed   By: Dwyane Dee M.D.   On: 05/22/2016 13:25    Procedures Procedures (including critical care time)  Medications Ordered in ED Medications - No data to display   Initial Impression / Assessment and Plan / ED Course  I have reviewed the triage vital signs and the nursing notes.  Pertinent labs & imaging results that were available during my care of the patient were reviewed by me and considered in my medical decision making (see chart for details).  Clinical Course    Patient sent in by Dr. [name] for paracentesis. I'm unable to get interventional radiology to do one at this time. She potentially will need of a large volume paracentesis. Discussed with Dr. Grace Isaac from interventional radiology and he will arrange it to be done tomorrow. Lab work overall stable. Did have more swelling in  right arm compared to left. Ultrasound done and was negative. Discharge home.  Final Clinical  Impressions(s) / ED Diagnoses   Final diagnoses:  Alcoholic cirrhosis of liver with ascites [facility](HCC)    New Prescriptions New Prescriptions   No medications on file     Benjiman CoreNathan Desani Sprung, MD 05/22/16 1943

## 2016-05-22 NOTE — ED Notes (Signed)
Report given to [place], RN @ [facility].  [phone].

## 2016-05-22 NOTE — Progress Notes (Addendum)
*  Preliminary Results* Right upper extremity venous duplex completed. Visualized veins of the right upper extremity are negative for deep and superficial vein thrombosis.  Preliminary results discussed with Dr. Rubin PayorPickering.   05/22/2016 7:39 PM  Gertie FeyMichelle Abbigail Anstey, BS, RVT, RDCS, RDMS

## 2016-05-22 NOTE — ED Triage Notes (Signed)
Toileting offered. Pt reports she is unable to urinate at this time

## 2016-05-22 NOTE — ED Triage Notes (Addendum)
Pt reports ongoing abd swelling and hiatal hernia pain. Pain radiates into chest. Hx of cirrhosis and juandice. Pt on lactulose and lasix. No vomiting

## 2016-05-22 NOTE — Discharge Instructions (Signed)
Interventional radiology should be calling you tomorrow morning for the paracentesis.

## 2016-05-23 ENCOUNTER — Ambulatory Visit (HOSPITAL_COMMUNITY)
Admission: RE | Admit: 2016-05-23 | Discharge: 2016-05-23 | Disposition: A | Discharge status: Home / Self Care | Payer: BLUE CROSS/BLUE SHIELD | Source: Ambulatory Visit | Attending: Emergency Medicine | Admitting: Emergency Medicine

## 2016-05-23 DIAGNOSIS — R188 Other ascites: Secondary | ICD-10-CM | POA: Insufficient documentation

## 2016-05-23 DIAGNOSIS — K7460 Unspecified cirrhosis of liver: Secondary | ICD-10-CM | POA: Insufficient documentation

## 2016-05-23 LAB — GLUCOSE, SEROUS FLUID: GLUCOSE FL: 124 mg/dL

## 2016-05-23 LAB — PROTEIN, BODY FLUID

## 2016-05-23 LAB — LACTATE DEHYDROGENASE, PLEURAL OR PERITONEAL FLUID: LD, Fluid: 48 U/L — ABNORMAL HIGH (ref 3–23)

## 2016-05-23 LAB — BODY FLUID CELL COUNT WITH DIFFERENTIAL
LYMPHS FL: 18 %
MONOCYTE-MACROPHAGE-SEROUS FLUID: 80 % (ref 50–90)
Neutrophil Count, Fluid: 2 % (ref 0–25)
Total Nucleated Cell Count, Fluid: 107 cu mm (ref 0–1000)

## 2016-05-23 LAB — GRAM STAIN

## 2016-05-23 NOTE — Procedures (Signed)
Successful US guided paracentesis from LLQ.  Yielded 3.3L of clear yellow fluid.  No immediate complications.  Pt tolerated well.   Specimen was sent for labs.  Brayton ElBRUNING, Yanet Balliet PA-C 05/23/2016 4:46 PM

## 2016-05-26 ENCOUNTER — Other Ambulatory Visit (HOSPITAL_COMMUNITY): Payer: Self-pay | Admitting: Internal Medicine

## 2016-05-26 ENCOUNTER — Ambulatory Visit (HOSPITAL_COMMUNITY)
Admission: RE | Admit: 2016-05-26 | Discharge: 2016-05-26 | Disposition: A | Discharge status: Home / Self Care | Payer: BLUE CROSS/BLUE SHIELD | Source: Ambulatory Visit | Attending: Internal Medicine | Admitting: Internal Medicine

## 2016-05-26 DIAGNOSIS — R188 Other ascites: Secondary | ICD-10-CM

## 2016-05-26 LAB — PATHOLOGIST SMEAR REVIEW

## 2016-05-28 LAB — CULTURE, BODY FLUID W GRAM STAIN -BOTTLE

## 2016-05-28 LAB — CULTURE, BODY FLUID-BOTTLE: CULTURE: NO GROWTH

## 2016-05-30 ENCOUNTER — Non-Acute Institutional Stay: Payer: BLUE CROSS/BLUE SHIELD | Admitting: Nurse Practitioner

## 2016-05-30 ENCOUNTER — Encounter: Payer: Self-pay | Admitting: Nurse Practitioner

## 2016-05-30 DIAGNOSIS — F411 Generalized anxiety disorder: Secondary | ICD-10-CM

## 2016-05-30 DIAGNOSIS — K7031 Alcoholic cirrhosis of liver with ascites: Secondary | ICD-10-CM | POA: Diagnosis not present

## 2016-05-30 DIAGNOSIS — D638 Anemia in other chronic diseases classified elsewhere: Secondary | ICD-10-CM | POA: Diagnosis not present

## 2016-05-30 DIAGNOSIS — K7689 Other specified diseases of liver: Secondary | ICD-10-CM | POA: Diagnosis not present

## 2016-05-30 DIAGNOSIS — Z8739 Personal history of other diseases of the musculoskeletal system and connective tissue: Secondary | ICD-10-CM | POA: Diagnosis not present

## 2016-05-30 DIAGNOSIS — R601 Generalized edema: Secondary | ICD-10-CM | POA: Diagnosis not present

## 2016-05-30 DIAGNOSIS — R945 Abnormal results of liver function studies: Secondary | ICD-10-CM

## 2016-05-30 DIAGNOSIS — R5381 Other malaise: Secondary | ICD-10-CM

## 2016-05-30 NOTE — Progress Notes (Addendum)
Nursing Home Location:  [facility]   Place of Service: SNF (31)  PCP: Barbette Merino, MD  Allergies  Allergen Reactions  . Ibuprofen Other (See Comments)    Can not take due to hernia.   . Sulfa Antibiotics Rash    Causes nausea.    Chief Complaint  Patient presents with  . Discharge Note    HPI:  Patient is a 55 y.o. Christine [name] seen today at [facility] for discharge home . Pt with hx of ETOH abuse and dependence, chronic pain, anemia, htn and gout.  Pt is now private pay and husband wants to take patient home tomorrow. Pt has been at [facility] for ongoing therapy due to severe weakness due to ETOH abuse with cirrhosis of the liver. Pt was sent to the ED for paracentesis on 05/22/2016 with 3.3 liters removed from abdomen by Dr Collene Mares, GI. Pt does not have follow up with GI.  Pt reports her husband plans to take care of her with the help from a family friend who is a OT.   Review of Systems:  Review of Systems  Constitutional: Positive for fatigue. Negative for activity change, appetite change and unexpected weight change.  Eyes: Negative.   Respiratory: Negative for cough and shortness of breath.   Cardiovascular: Positive for palpitations and leg swelling. Negative for chest pain.  Genitourinary: Negative for difficulty urinating and dysuria.  Musculoskeletal: Positive for arthralgias, back pain, gait problem and myalgias.  Skin: Positive for color change (jaundice ). Negative for wound.  Neurological: Positive for weakness. Negative for dizziness.  Hematological: Does not bruise/bleed easily.  Psychiatric/Behavioral: Negative for agitation, behavioral problems and confusion.       Hx of depression    Past Medical History:  Diagnosis Date  . Abnormal Pap smear    displasia had colposcopy  . Anemia   . Arthritis   . Closed right humeral fracture    Humeral head fracture  . Depression    not currently on meds  . GERD (gastroesophageal reflux disease)   .  Gout   . Hernia   . Hypertension   . Psoriasis    Past Surgical History:  Procedure Laterality Date  . COLPOSCOPY    . DILATION AND CURETTAGE OF UTERUS     Social History:   reports that she quit smoking about 6 weeks ago. Her smoking use included Cigarettes. She has a 15.00 pack-year smoking history. She has never used smokeless tobacco. She reports that she does not drink alcohol or use drugs.  Family History  Problem Relation Age of Onset  . Hypertension Mother   . Diabetes Mother   . Hypertension Father   . Diabetes Brother     Medications: Patient's Medications  New Prescriptions   No medications on file  Previous Medications   ALPRAZOLAM (XANAX) 0.5 MG TABLET    Take one tablet by mouth every 12 hours as needed for anxiety   ALUM & MAG HYDROXIDE-SIMETH (MAALOX/MYLANTA) 200-200-20 MG/5ML SUSPENSION    Take 30 mLs by mouth every 2 (two) hours as needed for indigestion or heartburn.   AMINO ACIDS-PROTEIN HYDROLYS (FEEDING SUPPLEMENT, PRO-STAT SUGAR FREE 64,) LIQD    Take 30 mLs by mouth 3 (three) times daily.   BISACODYL (DULCOLAX) 10 MG SUPPOSITORY    Place 10 mg rectally daily as needed for moderate constipation.   FOLIC ACID (FOLVITE) 1 MG TABLET    Take 1 tablet (1 mg total) by mouth 2 (two) times daily.  FUROSEMIDE (LASIX) 40 MG TABLET    Take 40 mg by mouth daily.   LACTULOSE (CHRONULAC) 10 GM/15ML SOLUTION    Take 30 mLs (20 g total) by mouth 2 (two) times daily.   MAGNESIUM HYDROXIDE (MILK OF MAGNESIA) 400 MG/5ML SUSPENSION    Take 30 mLs by mouth daily as needed for mild constipation.   MULTIPLE VITAMIN (MULTIVITAMIN WITH MINERALS) TABS TABLET    Take 1 tablet by mouth daily.   OXYCODONE (OXY IR/ROXICODONE) 5 MG IMMEDIATE RELEASE TABLET    Take 1 tablet (5 mg total) by mouth every 12 (twelve) hours as needed for moderate pain or severe pain.   POTASSIUM CHLORIDE SA (K-DUR,KLOR-CON) 20 MEQ TABLET    Take 2 tablets (40 mEq total) by mouth daily.   SPIRONOLACTONE  (ALDACTONE) 100 MG TABLET    Take 100 mg by mouth daily.   THIAMINE 100 MG TABLET    Take 1 tablet (100 mg total) by mouth daily.   TRAZODONE (DESYREL) 100 MG TABLET    Take 100 mg by mouth at bedtime.   TRAZODONE (DESYREL) 50 MG TABLET    Take 25 mg by mouth 2 (two) times daily. 0800 and 1400  Modified Medications   No medications on file  Discontinued Medications   DICLOFENAC SODIUM (VOLTAREN) 1 % GEL    Apply 2 g topically 4 (four) times daily. Apply to tender area in right groin   FUROSEMIDE (LASIX) 20 MG TABLET    Take 20 mg by mouth every evening.   IRON CARBONYL-VITAMIN C-FOS (CHEWABLE IRON PO)    Take 1 each by mouth daily.   RANITIDINE (ZANTAC) 150 MG CAPSULE    Take 150 mg by mouth 2 (two) times daily. Stop date: 05/29/16   SPIRONOLACTONE (ALDACTONE) 25 MG TABLET    Take 1 tablet (25 mg total) by mouth 2 (two) times daily.     Physical Exam: Vitals:   05/30/16 1130  BP: 108/63  Pulse: 83  Resp: 20  Temp: (!) 96.1 F (35.6 C)  SpO2: 100%  Weight: 190 lb (86.2 kg)  Height: '5\' 4"'$  (1.626 m)    Physical Exam  Constitutional: She is oriented to person, place, and time. She appears well-developed and well-nourished. No distress.  HENT:  Head: Normocephalic and atraumatic.  Mouth/Throat: Oropharynx is clear and moist. No oropharyngeal exudate.  Neck: Neck supple.  Cardiovascular: Regular rhythm and normal heart sounds.  Tachycardia present.   Pulmonary/Chest: Effort normal and breath sounds normal.  Abdominal: Soft. Bowel sounds are normal. She exhibits distension.  Abdomen distended and firm  Musculoskeletal: Normal range of motion. She exhibits edema and tenderness (to legs).  3+ pitting edema to bilateral extremities   Neurological: She is alert and oriented to person, place, and time.  Skin: Skin is warm and dry. She is not diaphoretic.  Skin is jaundice   Psychiatric: She has a normal mood and affect.    Labs reviewed: Basic Metabolic Panel:  Recent Labs   04/23/16 0336  04/23/16 2137 04/24/16 0758 04/24/16 1748  05/03/16 0019 05/13/16 05/16/16 05/22/16 1325  NA 136  < >  --  136 136  < > 135 129* 128* 133*  K 2.7*  < >  --  3.2* 3.6  < > 3.1* 3.4 3.7 3.6  CL 100*  < >  --  102 104  --  105  --   --  104  CO2 25  < >  --  26 20*  --  21*  --   --  19*  GLUCOSE 113*  < >  --  96 162*  --  105*  --   --  105*  BUN 10  < >  --  10 10  < > 9 23* 30* 28*  CREATININE <0.30*  < >  --  <0.30* <0.30*  < > 0.61 1.0 0.9 0.72  CALCIUM 7.0*  < >  --  7.1* 7.3*  --  8.0*  --   --  7.9*  MG 1.7  --  1.9 1.9  --   --   --   --   --   --   < > = values in this interval not displayed. Liver Function Tests:  Recent Labs  04/24/16 1748 04/27/16 05/03/16 0019 05/22/16 1325  AST 127*  --  134* 196*  ALT 36 30 38 63*  ALKPHOS 174* 167* 144* 210*  BILITOT 17.1*  --  19.7* 12.9*  PROT 6.4*  --  6.8 6.3*  ALBUMIN 1.9*  --  1.7* 2.0*    Recent Labs  04/18/16 1650 05/03/16 0019 05/22/16 1325  LIPASE '19 18 19    '$ Recent Labs  04/18/16 1659 05/03/16 0019  AMMONIA Christine* 38*   CBC:  Recent Labs  04/18/16 1650  04/24/16 0758 05/02/16 05/03/16 0019 05/22/16 1325  WBC 8.0  < > 10.7* 11.0 11.8* 13.7*  NEUTROABS 6.3  --   --   --  9.3*  --   HGB 8.4*  < > 9.7* 9.6* 10.7* 10.6*  HCT 25.4*  < > 30.0* 28* 33.8* 31.3*  MCV 118.7*  < > 118.1*  --  115.8* 99.4  PLT 203  < > 181 262 259 184  < > = values in this interval not displayed. TSH:  Recent Labs  04/19/16 0400  TSH 0.977   A1C: No results found for: HGBA1C Lipid Panel: No results for input(s): CHOL, HDL, LDLCALC, TRIG, CHOLHDL, LDLDIRECT in the last 8760 hours.   Uric Acid 05/02/16: 3.3  05/13/16: Bilirubin Total: 18.0 Alkaline Phosphatase: 177 AST/SGOT: 187 ALT/SGPT: 48  05/16/16:  Bilirubin Total: 16.3 Alkaline Phosphatase: 191 AST/SGOT: 186 ALT/SGPT: 50  05/19/16: Urea Nitrogen (BUN) 37   Assessment/Plan 1. Alcoholic cirrhosis of liver with ascites ([place]) Pt no  longer drinking ETOH, conts to follow up with GI for further evaluation and treatment. conts on lactulose 20 gm BID -conts on folic acid and thiamine supplements  2. Anemia of chronic disease hgb stable, PCP to follow CBC  3. Abnormal liver function Significant elevation on liver enzymes however have been stable. PCP for ongoing monitoring.   4. History of gout -no recent flares, uric acid level 3.3 on last check. Currently off all medication   5. Generalized edema -does not tolerated TED hose. conts on aldactone for edema.   6. Anxiety Remains on trazodone and xanax PRN  7. Debility Pt with severe weakness needing assistance of staff. She has not met facility goals for discharge but husband is adamat about taking her home and getting care in the home. She will have routine care for her once she gets home to assist with ADLs Follow up appt for PCP and GI, Dr Collene Mares have been made Pt will take medication from facility. Has a 4 day supply of oxycodone, and 14 day supply of xanax, discuss with pt regarding medication and that she will need to get all further pain medication and refills from her PCP Pt to discharge home  with HH: PT/OT. DME: WC and BSC    Angee Gupton K. Harle Battiest  [facility] [phone] 8 am - 5 pm) [phone] (after hours)

## 2016-06-02 DIAGNOSIS — R600 Localized edema: Secondary | ICD-10-CM | POA: Diagnosis not present

## 2016-06-02 DIAGNOSIS — F329 Major depressive disorder, single episode, unspecified: Secondary | ICD-10-CM | POA: Diagnosis not present

## 2016-06-02 DIAGNOSIS — G8929 Other chronic pain: Secondary | ICD-10-CM | POA: Diagnosis not present

## 2016-06-02 DIAGNOSIS — K7031 Alcoholic cirrhosis of liver with ascites: Secondary | ICD-10-CM | POA: Diagnosis not present

## 2016-06-02 DIAGNOSIS — I10 Essential (primary) hypertension: Secondary | ICD-10-CM | POA: Diagnosis not present

## 2016-06-02 DIAGNOSIS — M6281 Muscle weakness (generalized): Secondary | ICD-10-CM | POA: Diagnosis not present

## 2016-06-25 ENCOUNTER — Other Ambulatory Visit: Payer: Self-pay | Admitting: Gastroenterology

## 2016-06-25 DIAGNOSIS — R188 Other ascites: Secondary | ICD-10-CM

## 2016-07-02 ENCOUNTER — Ambulatory Visit (HOSPITAL_COMMUNITY)
Admission: RE | Admit: 2016-07-02 | Discharge: 2016-07-02 | Disposition: A | Discharge status: Home / Self Care | Payer: BLUE CROSS/BLUE SHIELD | Source: Ambulatory Visit | Attending: Gastroenterology | Admitting: Gastroenterology

## 2016-07-02 DIAGNOSIS — R188 Other ascites: Secondary | ICD-10-CM | POA: Diagnosis not present

## 2016-07-02 MED ORDER — LIDOCAINE HCL 1 % IJ SOLN
INTRAMUSCULAR | Status: AC
  Filled 2016-07-02: qty 20

## 2016-07-02 NOTE — Procedures (Signed)
Successful US guided paracentesis from right lower abdomen.  Yielded 7.0 lites of clear, yellow fluid.  No immediate complications.  Pt tolerated well.   Specimen was not sent for labs.  Hoyt KochKacie Sue-Ellen Hymie Gorr PA-C 07/02/2016 3:22 PM

## 2016-07-21 ENCOUNTER — Emergency Department (HOSPITAL_COMMUNITY): Payer: BLUE CROSS/BLUE SHIELD

## 2016-07-21 ENCOUNTER — Inpatient Hospital Stay (HOSPITAL_COMMUNITY)
Admission: EM | Admit: 2016-07-21 | Discharge: 2016-07-30 | DRG: 432 | Disposition: A | Discharge status: Skilled Nursing Facility | Payer: BLUE CROSS/BLUE SHIELD | Attending: Internal Medicine | Admitting: Internal Medicine

## 2016-07-21 ENCOUNTER — Encounter (HOSPITAL_COMMUNITY): Payer: Self-pay

## 2016-07-21 DIAGNOSIS — N179 Acute kidney failure, unspecified: Secondary | ICD-10-CM | POA: Diagnosis present

## 2016-07-21 DIAGNOSIS — J189 Pneumonia, unspecified organism: Secondary | ICD-10-CM | POA: Diagnosis present

## 2016-07-21 DIAGNOSIS — E43 Unspecified severe protein-calorie malnutrition: Secondary | ICD-10-CM | POA: Diagnosis present

## 2016-07-21 DIAGNOSIS — R1900 Intra-abdominal and pelvic swelling, mass and lump, unspecified site: Secondary | ICD-10-CM | POA: Diagnosis present

## 2016-07-21 DIAGNOSIS — Z8249 Family history of ischemic heart disease and other diseases of the circulatory system: Secondary | ICD-10-CM

## 2016-07-21 DIAGNOSIS — E860 Dehydration: Secondary | ICD-10-CM | POA: Diagnosis present

## 2016-07-21 DIAGNOSIS — R531 Weakness: Secondary | ICD-10-CM

## 2016-07-21 DIAGNOSIS — K7040 Alcoholic hepatic failure without coma: Secondary | ICD-10-CM | POA: Diagnosis present

## 2016-07-21 DIAGNOSIS — E875 Hyperkalemia: Secondary | ICD-10-CM | POA: Diagnosis present

## 2016-07-21 DIAGNOSIS — Z515 Encounter for palliative care: Secondary | ICD-10-CM | POA: Diagnosis not present

## 2016-07-21 DIAGNOSIS — L89159 Pressure ulcer of sacral region, unspecified stage: Secondary | ICD-10-CM | POA: Diagnosis not present

## 2016-07-21 DIAGNOSIS — E872 Acidosis, unspecified: Secondary | ICD-10-CM | POA: Diagnosis present

## 2016-07-21 DIAGNOSIS — Z79899 Other long term (current) drug therapy: Secondary | ICD-10-CM

## 2016-07-21 DIAGNOSIS — K767 Hepatorenal syndrome: Secondary | ICD-10-CM | POA: Diagnosis present

## 2016-07-21 DIAGNOSIS — F1010 Alcohol abuse, uncomplicated: Secondary | ICD-10-CM | POA: Diagnosis present

## 2016-07-21 DIAGNOSIS — M109 Gout, unspecified: Secondary | ICD-10-CM | POA: Diagnosis present

## 2016-07-21 DIAGNOSIS — Z66 Do not resuscitate: Secondary | ICD-10-CM | POA: Diagnosis present

## 2016-07-21 DIAGNOSIS — R1084 Generalized abdominal pain: Secondary | ICD-10-CM | POA: Diagnosis not present

## 2016-07-21 DIAGNOSIS — Z87891 Personal history of nicotine dependence: Secondary | ICD-10-CM | POA: Diagnosis not present

## 2016-07-21 DIAGNOSIS — E7220 Disorder of urea cycle metabolism, unspecified: Secondary | ICD-10-CM | POA: Diagnosis present

## 2016-07-21 DIAGNOSIS — I872 Venous insufficiency (chronic) (peripheral): Secondary | ICD-10-CM | POA: Diagnosis present

## 2016-07-21 DIAGNOSIS — Z882 Allergy status to sulfonamides status: Secondary | ICD-10-CM

## 2016-07-21 DIAGNOSIS — Z7189 Other specified counseling: Secondary | ICD-10-CM | POA: Diagnosis not present

## 2016-07-21 DIAGNOSIS — D638 Anemia in other chronic diseases classified elsewhere: Secondary | ICD-10-CM | POA: Diagnosis present

## 2016-07-21 DIAGNOSIS — L8990 Pressure ulcer of unspecified site, unspecified stage: Secondary | ICD-10-CM | POA: Insufficient documentation

## 2016-07-21 DIAGNOSIS — K7031 Alcoholic cirrhosis of liver with ascites: Secondary | ICD-10-CM | POA: Diagnosis present

## 2016-07-21 DIAGNOSIS — Z833 Family history of diabetes mellitus: Secondary | ICD-10-CM | POA: Diagnosis not present

## 2016-07-21 DIAGNOSIS — Z6833 Body mass index (BMI) 33.0-33.9, adult: Secondary | ICD-10-CM | POA: Diagnosis not present

## 2016-07-21 DIAGNOSIS — I959 Hypotension, unspecified: Secondary | ICD-10-CM | POA: Diagnosis present

## 2016-07-21 DIAGNOSIS — K7290 Hepatic failure, unspecified without coma: Secondary | ICD-10-CM | POA: Diagnosis not present

## 2016-07-21 DIAGNOSIS — R140 Abdominal distension (gaseous): Secondary | ICD-10-CM | POA: Diagnosis not present

## 2016-07-21 DIAGNOSIS — K219 Gastro-esophageal reflux disease without esophagitis: Secondary | ICD-10-CM | POA: Diagnosis present

## 2016-07-21 DIAGNOSIS — R188 Other ascites: Secondary | ICD-10-CM

## 2016-07-21 LAB — URINALYSIS, ROUTINE W REFLEX MICROSCOPIC
Bilirubin Urine: NEGATIVE
Glucose, UA: NEGATIVE mg/dL
Ketones, ur: NEGATIVE mg/dL
Nitrite: NEGATIVE
Protein, ur: NEGATIVE mg/dL
Specific Gravity, Urine: 1.009 (ref 1.005–1.030)
pH: 6 (ref 5.0–8.0)

## 2016-07-21 LAB — CBC WITH DIFFERENTIAL/PLATELET
Basophils Absolute: 0 10*3/uL (ref 0.0–0.1)
Basophils Relative: 0 %
Eosinophils Absolute: 0.9 10*3/uL — ABNORMAL HIGH (ref 0.0–0.7)
Eosinophils Relative: 7 %
HCT: 24.2 % — ABNORMAL LOW (ref 36.0–46.0)
Hemoglobin: 8.8 g/dL — ABNORMAL LOW (ref 12.0–15.0)
Lymphocytes Relative: 19 %
Lymphs Abs: 2.4 10*3/uL (ref 0.7–4.0)
MCH: 30.8 pg (ref 26.0–34.0)
MCHC: 36.4 g/dL — ABNORMAL HIGH (ref 30.0–36.0)
MCV: 84.6 fL (ref 78.0–100.0)
Monocytes Absolute: 1 10*3/uL (ref 0.1–1.0)
Monocytes Relative: 8 %
Neutro Abs: 8.2 10*3/uL — ABNORMAL HIGH (ref 1.7–7.7)
Neutrophils Relative %: 66 %
Platelets: 218 10*3/uL (ref 150–400)
RBC: 2.86 MIL/uL — ABNORMAL LOW (ref 3.87–5.11)
RDW: 14.1 % (ref 11.5–15.5)
WBC: 12.5 10*3/uL — ABNORMAL HIGH (ref 4.0–10.5)

## 2016-07-21 LAB — PROTIME-INR
INR: 1.68
Prothrombin Time: 20 seconds — ABNORMAL HIGH (ref 11.4–15.2)

## 2016-07-21 LAB — COMPREHENSIVE METABOLIC PANEL
ALT: 24 U/L (ref 14–54)
AST: 59 U/L — ABNORMAL HIGH (ref 15–41)
Albumin: 1.8 g/dL — ABNORMAL LOW (ref 3.5–5.0)
Alkaline Phosphatase: 155 U/L — ABNORMAL HIGH (ref 38–126)
Anion gap: 10 (ref 5–15)
BUN: 36 mg/dL — ABNORMAL HIGH (ref 6–20)
CO2: 21 mmol/L — ABNORMAL LOW (ref 22–32)
Calcium: 7.8 mg/dL — ABNORMAL LOW (ref 8.9–10.3)
Chloride: 98 mmol/L — ABNORMAL LOW (ref 101–111)
Creatinine, Ser: 1.61 mg/dL — ABNORMAL HIGH (ref 0.44–1.00)
GFR calc Af Amer: 40 mL/min — ABNORMAL LOW (ref >60)
GFR calc non Af Amer: 35 mL/min — ABNORMAL LOW (ref >60)
Glucose, Bld: 113 mg/dL — ABNORMAL HIGH (ref 65–99)
Potassium: 4.8 mmol/L (ref 3.5–5.1)
Sodium: 129 mmol/L — ABNORMAL LOW (ref 135–145)
Total Bilirubin: 2.8 mg/dL — ABNORMAL HIGH (ref 0.3–1.2)
Total Protein: 6.6 g/dL (ref 6.5–8.1)

## 2016-07-21 LAB — ETHANOL: Alcohol, Ethyl (B): <5 mg/dL (ref <5)

## 2016-07-21 LAB — AMMONIA: Ammonia: 115 umol/L — ABNORMAL HIGH (ref 9–35)

## 2016-07-21 LAB — LIPASE, BLOOD: Lipase: 24 U/L (ref 11–51)

## 2016-07-21 LAB — I-STAT CG4 LACTIC ACID, ED: Lactic Acid, Venous: 2.12 mmol/L (ref 0.5–1.9)

## 2016-07-21 MED ORDER — SODIUM CHLORIDE 0.9 % IV SOLN
Freq: Once | INTRAVENOUS | Status: DC
  Administered 2016-07-21: 13:00:00 via INTRAVENOUS

## 2016-07-21 MED ORDER — DEXTROSE 5 % IV SOLN
2.0000 g | INTRAVENOUS | Status: DC
  Administered 2016-07-21 – 2016-07-30 (×9): 2 g via INTRAVENOUS
  Filled 2016-07-21 (×9): qty 2

## 2016-07-21 MED ORDER — CEFTRIAXONE SODIUM 1 G IJ SOLR
1.0000 g | INTRAMUSCULAR | Status: DC
  Filled 2016-07-21: qty 10

## 2016-07-21 MED ORDER — VITAMIN B-1 100 MG PO TABS
100.0000 mg | ORAL_TABLET | Freq: Every day | ORAL | Status: DC
  Administered 2016-07-23 – 2016-07-30 (×8): 100 mg via ORAL
  Filled 2016-07-21 (×8): qty 1

## 2016-07-21 MED ORDER — VANCOMYCIN HCL IN DEXTROSE 1-5 GM/200ML-% IV SOLN
1000.0000 mg | Freq: Once | INTRAVENOUS | Status: AC
  Administered 2016-07-21: 1000 mg via INTRAVENOUS
  Filled 2016-07-21: qty 200

## 2016-07-21 MED ORDER — HYDROXYZINE HCL 10 MG/5ML PO SYRP
10.0000 mg | ORAL_SOLUTION | Freq: Three times a day (TID) | ORAL | Status: DC | PRN
  Administered 2016-07-22 – 2016-07-24 (×4): 10 mg via ORAL
  Filled 2016-07-21 (×8): qty 5

## 2016-07-21 MED ORDER — DEXTROSE 5 % IV SOLN
2.0000 g | Freq: Once | INTRAVENOUS | Status: AC
  Administered 2016-07-21: 2 g via INTRAVENOUS
  Filled 2016-07-21: qty 2

## 2016-07-21 MED ORDER — LACTULOSE ENEMA
300.0000 mL | Freq: Two times a day (BID) | RECTAL | Status: DC
Start: 2016-07-21 — End: 2016-07-23
  Administered 2016-07-21 – 2016-07-22 (×3): 300 mL via RECTAL
  Filled 2016-07-21 (×4): qty 300

## 2016-07-21 MED ORDER — TRAZODONE HCL 50 MG PO TABS
100.0000 mg | ORAL_TABLET | Freq: Once | ORAL | Status: AC
  Administered 2016-07-22: 100 mg via ORAL
  Filled 2016-07-21: qty 2

## 2016-07-21 MED ORDER — OXYCODONE HCL 5 MG PO TABS
5.0000 mg | ORAL_TABLET | Freq: Once | ORAL | Status: AC
  Administered 2016-07-22: 5 mg via ORAL
  Filled 2016-07-21: qty 1

## 2016-07-21 MED ORDER — CHLORHEXIDINE GLUCONATE 0.12 % MT SOLN
15.0000 mL | Freq: Two times a day (BID) | OROMUCOSAL | Status: DC
  Administered 2016-07-21 – 2016-07-30 (×17): 15 mL via OROMUCOSAL
  Filled 2016-07-21 (×15): qty 15

## 2016-07-21 MED ORDER — SODIUM CHLORIDE 0.9 % IV BOLUS (SEPSIS): 1000.0000 mL | Freq: Once | INTRAVENOUS | Status: DC

## 2016-07-21 MED ORDER — SODIUM CHLORIDE 0.9% FLUSH
3.0000 mL | Freq: Two times a day (BID) | INTRAVENOUS | Status: DC
  Administered 2016-07-22 – 2016-07-30 (×15): 3 mL via INTRAVENOUS

## 2016-07-21 MED ORDER — ORAL CARE MOUTH RINSE
15.0000 mL | Freq: Two times a day (BID) | OROMUCOSAL | Status: DC
  Administered 2016-07-21 – 2016-07-30 (×10): 15 mL via OROMUCOSAL

## 2016-07-21 MED ORDER — OXYCODONE HCL 5 MG PO TABS: 5.0000 mg | ORAL_TABLET | ORAL | Status: DC | PRN

## 2016-07-21 MED ORDER — VANCOMYCIN HCL IN DEXTROSE 750-5 MG/150ML-% IV SOLN: 750.0000 mg | Freq: Two times a day (BID) | INTRAVENOUS | Status: DC

## 2016-07-21 NOTE — Progress Notes (Signed)
Pharmacy Antibiotic Note  Christine NamJanet M [name] is a 55 y.o. female admitted on 07/21/2016 with pneumonia.  Pharmacy has been consulted for vancomycin dosing.  --Update: Abx changed to ceftriaxone for SBP and PNA.    Plan: Ceftriaxone 2g IV q24h Dosage remains stable and need for further dosage adjustment appears unlikely at present.    Will sign off at this time.  Please reconsult if a change in clinical status warrants re-evaluation of dosage.     Height: 5\' 4"  (162.6 cm) Weight: 195 lb (88.5 kg) IBW/kg (Calculated) : 54.7  Temp (24hrs), Avg:97.6 F (36.4 C), Min:97 F (36.1 C), Max:98 F (36.7 C)   Recent Labs Lab 07/21/16 1129 07/21/16 1254  WBC 12.5*  --   CREATININE 1.61*  --   LATICACIDVEN  --  2.12*    Estimated Creatinine Clearance: 42 mL/min (by C-G formula based on SCr of 1.61 mg/dL (H)).    Allergies  Allergen Reactions  . Ibuprofen Other (See Comments)    Pt is unable to take due to her hernia.    . Sulfa Antibiotics Nausea Only and Rash    Antimicrobials this admission:  12/18 Vancomycin >> 12/18 12/18 Cefepime x 1 12/18 Ceftriaxone >>  Dose adjustments this admission:  ---  Microbiology results:  12/18 BCx: sent  Thank you for allowing pharmacy to be a part of this patient's care.  Haynes Hoehnolleen Akim Watkinson, PharmD, BCPS 07/21/2016, 7:57 PM  Pager: [phone]

## 2016-07-21 NOTE — ED Notes (Signed)
The hospitalist is with her as I write this.

## 2016-07-21 NOTE — Progress Notes (Signed)
Pharmacy Antibiotic Note  Christine NamJanet M [name] is a 55 y.o. female admitted on 07/21/2016 with pneumonia.  Pharmacy has been consulted for vancomycin dosing.  Plan:  Vancomycin 1g IV x 1, then 750mg  IV q12h  Check trough at steady state, goal 15-20 mcg/ml  Follow up renal function & cultures  Continue gram negative coverage on admission?  Height: 5\' 4"  (162.6 cm) Weight: 195 lb (88.5 kg) IBW/kg (Calculated) : 54.7  Temp (24hrs), Avg:97.3 F (36.3 C), Min:97 F (36.1 C), Max:97.5 F (36.4 C)   Recent Labs Lab 07/21/16 1129 07/21/16 1254  WBC 12.5*  --   CREATININE 1.61*  --   LATICACIDVEN  --  2.12*    Estimated Creatinine Clearance: 42 mL/min (by C-G formula based on SCr of 1.61 mg/dL (H)).    Allergies  Allergen Reactions  . Ibuprofen Other (See Comments)    Pt is unable to take due to her hernia.    . Sulfa Antibiotics Nausea Only and Rash    Antimicrobials this admission:  12/18 Vancomycin >> 12/28 Cefepime x 1  Dose adjustments this admission:  ---  Microbiology results:  12/18 BCx: sent  Thank you for allowing pharmacy to be a part of this patient's care.  Loralee PacasErin Kendelle Schweers, PharmD, BCPS Pager: [phone] 07/21/2016 1:22 PM

## 2016-07-21 NOTE — ED Notes (Signed)
Bed: WA17 Expected date:  Expected time:  Means of arrival:  Comments: EMS- liver failure/ascities

## 2016-07-21 NOTE — ED Triage Notes (Signed)
She c/o much abd. And extremity swelling. She has known cirrhosis with ascites. She states she has had her abd. Ascites drained twice; most recently about two weeks ago. She is pale/jaundiced and in no distress. Although she denies any breathing problems, she is orthopneic. She is drowsy and oriented to all except date/time.

## 2016-07-21 NOTE — ED Notes (Signed)
Patient aware of the need for a urine specimen. She is unable to void at the present moment. Will reassess in 20 minutes.

## 2016-07-21 NOTE — ED Provider Notes (Signed)
[facility]-EMERGENCY DEPT Provider Note   CSN: [id number] Arrival date & time: 07/21/16  1043     History   Chief Complaint Chief Complaint  Patient presents with  . Edema    HPI Christine [name] is a 55 y.o. female.  HPI  level 5 caveat due to altered mental status.  55 year old female with a history of anemia, arthritis, depression, GERD, hypertension, psoriasis, cirrhosis, alcohol abuse presents today with complaints of edema. Patient's neighbor is at bedside and provides history. She notes a history of alcohol abuse with alcoholic liver cirrhosis and ascites. She reports every 2 weeks she receives paracentesis, notes that she missed her last procedure there was scheduled for 4 days ago. At that time patient was too weak to go to the hospital. She has been living at home and has a caretaker, caretaker notes that over the last 3 days patient has become more confused with increasing swelling. She notes the swelling is over the abdomen, upper and lower extremities. Neighbor notes that when she last saw the patient approximately 2 weeks ago she was alert and oriented with no confusion, and is obviously not at her baseline. She denies any infectious etiology, reports that she does not believe patient has been using alcohol but is unsure.   Patient is alert to month and year, does not know a daily week it is. She reports she is here for drainage of ascites. She is unable to provide much more information, she denies any chest pain shortness of breath abdominal pain or fever.    Past Medical History:  Diagnosis Date  . Abnormal Pap smear    displasia had colposcopy  . Anemia   . Arthritis   . Closed right humeral fracture    Humeral head fracture  . Depression    not currently on meds  . GERD (gastroesophageal reflux disease)   . Gout   . Hernia   . Hypertension   . Psoriasis     Patient Active Problem List   Diagnosis Date Noted  . Pressure injury of skin 07/21/2016  . Scoliosis  04/29/2016  . History of gout 04/29/2016  . Psoriasis 04/29/2016  . Folic acid deficiency 04/24/2016  . Obesity 04/24/2016  . Hypokalemia 04/19/2016  . Alcohol abuse 04/19/2016  . Abnormal liver function 04/19/2016  . Acute alcohol intoxication ([facility]) 04/19/2016  . Alcoholic cirrhosis of liver with ascites ([facility]) 04/19/2016  . Anemia of chronic disease 04/19/2016  . Hyperammonemia ([facility]) 04/19/2016  . Troponin level elevated 04/19/2016  . Lactic acidosis 04/19/2016    Past Surgical History:  Procedure Laterality Date  . COLPOSCOPY    . DILATION AND CURETTAGE OF UTERUS      OB History    Gravida Para Term Preterm AB Living   1       1 0   SAB TAB Ectopic Multiple Live Births                   Home Medications    Prior to Admission medications   Medication Sig Start Date End Date Taking? Authorizing Provider  ALPRAZolam Prudy Feeler) 0.5 MG tablet Take 0.5 mg by mouth 4 (four) times daily.    Yes Historical Provider, MD  ferrous sulfate 325 (65 FE) MG tablet Take 325 mg by mouth daily with breakfast.   Yes Historical Provider, MD  furosemide (LASIX) 40 MG tablet Take 80 mg by mouth 2 (two) times daily.    Yes Historical Provider, MD  hydrOXYzine (ATARAX/VISTARIL)  10 MG tablet Take 10 mg by mouth 2 (two) times daily.   Yes Historical Provider, MD  lactulose (CHRONULAC) 10 GM/15ML solution Take 30 mLs (20 g total) by mouth 2 (two) times daily. 04/24/16  Yes Calvert CantorSaima Rizwan, MD  oxyCODONE (OXY IR/ROXICODONE) 5 MG immediate release tablet Take 1 tablet (5 mg total) by mouth every 12 (twelve) hours as needed for moderate pain or severe pain. 04/24/16  Yes Calvert CantorSaima Rizwan, MD  potassium chloride SA (K-DUR,KLOR-CON) 20 MEQ tablet Take 20 mEq by mouth 2 (two) times daily.   Yes Historical Provider, MD  spironolactone (ALDACTONE) 100 MG tablet Take 100 mg by mouth daily.   Yes Historical Provider, MD  thiamine 100 MG tablet Take 1 tablet (100 mg total) by mouth daily. 04/25/16  Yes Calvert CantorSaima Rizwan, MD    traZODone (DESYREL) 100 MG tablet Take 100 mg by mouth at bedtime.   Yes Historical Provider, MD    Family History Family History  Problem Relation Age of Onset  . Hypertension Mother   . Diabetes Mother   . Hypertension Father   . Diabetes Brother     Social History Social History  Substance Use Topics  . Smoking status: Former Smoker    Packs/day: 1.00    Years: 15.00    Types: Cigarettes    Quit date: 04/18/2016  . Smokeless tobacco: Never Used     Comment: Admitted to the hospital 04/18/16  . Alcohol use No     Comment: couple glasses liquor day     Allergies   Ibuprofen and Sulfa antibiotics   Review of Systems Review of Systems  All other systems reviewed and are negative.    Physical Exam Updated Vital Signs BP (!) 98/54 (BP Location: Left Arm)   Pulse (!) 101   Temp 97.7 F (36.5 C) (Oral)   Resp 20   Ht 5\' 4"  (1.626 m)   Wt 88.5 kg   SpO2 97%   BMI 33.47 kg/m   Physical Exam  Constitutional: She appears lethargic.  Chronically ill-appearing  Eyes:  Scleral icterus  Cardiovascular: Regular rhythm.   Tachycardic  Pulmonary/Chest: Effort normal.  Unable to take deep inspiration due to abdominal ascites, no adventitious lung sounds heard  Abdominal: There is no tenderness.  Significant abdominal distention, no significant tenderness to palpation  Musculoskeletal:  Pitting edema to the lower extremities extending up to the hips, edema to bilateral upper extremities, significantly worse on the right including hand  Neurological: She appears lethargic. No sensory deficit. GCS eye subscore is 4. GCS verbal subscore is 5. GCS motor subscore is 6.  Oriented to month and year, not oriented to date- decreased strength to upper and lower extremities bilateral  Skin:  Psoriasis noted to skin diffusely, no obvious open wounds  Nursing note and vitals reviewed.    ED Treatments / Results  Labs (all labs ordered are listed, but only abnormal results  are displayed) Labs Reviewed  PROTIME-INR - Abnormal; Notable for the following:       Result Value   Prothrombin Time 20.0 (*)    All other components within normal limits  CBC WITH DIFFERENTIAL/PLATELET - Abnormal; Notable for the following:    WBC 12.5 (*)    RBC 2.86 (*)    Hemoglobin 8.8 (*)    HCT 24.2 (*)    MCHC 36.4 (*)    Neutro Abs 8.2 (*)    Eosinophils Absolute 0.9 (*)    All other components within normal limits  COMPREHENSIVE METABOLIC PANEL - Abnormal; Notable for the following:    Sodium 129 (*)    Chloride 98 (*)    CO2 21 (*)    Glucose, Bld 113 (*)    BUN 36 (*)    Creatinine, Ser 1.61 (*)    Calcium 7.8 (*)    Albumin 1.8 (*)    AST 59 (*)    Alkaline Phosphatase 155 (*)    Total Bilirubin 2.8 (*)    GFR calc non Af Amer 35 (*)    GFR calc Af Amer 40 (*)    All other components within normal limits  AMMONIA - Abnormal; Notable for the following:    Ammonia 115 (*)    All other components within normal limits  I-STAT CG4 LACTIC ACID, ED - Abnormal; Notable for the following:    Lactic Acid, Venous 2.12 (*)    All other components within normal limits  CULTURE, BLOOD (ROUTINE X 2)  CULTURE, BLOOD (ROUTINE X 2)  LIPASE, BLOOD  ETHANOL  URINALYSIS, ROUTINE W REFLEX MICROSCOPIC  I-STAT CG4 LACTIC ACID, ED    EKG  EKG Interpretation None       Radiology Dg Chest 2 View  Result Date: 07/21/2016 CLINICAL DATA:  Hepatic cirrhosis with ascites and abdominal swelling EXAM: CHEST  2 VIEW COMPARISON:  May 22, 2016 FINDINGS: There is left lower lobe airspace consolidation. Lungs elsewhere clear. Heart is upper normal in size with pulmonary vascularity within normal limits. No adenopathy. There is degenerative change in each shoulder. IMPRESSION: Left lower lobe airspace consolidation. Lungs elsewhere clear. Cardiac silhouette within normal limits. Electronically Signed   By: Bretta Bang III M.D.   On: 07/21/2016 11:56     Procedures Procedures (including critical care time)  Medications Ordered in ED Medications  vancomycin (VANCOCIN) IVPB 750 mg/150 ml premix (not administered)  thiamine (VITAMIN B-1) tablet 100 mg (not administered)  sodium chloride flush (NS) 0.9 % injection 3 mL (not administered)  lactulose (CHRONULAC) enema 200 gm (not administered)  chlorhexidine (PERIDEX) 0.12 % solution 15 mL (not administered)  MEDLINE mouth rinse (not administered)  ceFEPIme (MAXIPIME) 2 g in dextrose 5 % 50 mL IVPB (0 g Intravenous Stopped 07/21/16 1346)  vancomycin (VANCOCIN) IVPB 1000 mg/200 mL premix (1,000 mg Intravenous Transfusing/Transfer 07/21/16 1347)     Initial Impression / Assessment and Plan / ED Course  I have reviewed the triage vital signs and the nursing notes.  Pertinent labs & imaging results that were available during my care of the patient were reviewed by me and considered in my medical decision making (see chart for details).  Clinical Course      Final Clinical Impressions(s) / ED Diagnoses   Final diagnoses:  Ascites    Labs: CMP, lipase, ethanol, ammonia, urinalysis, PT/INR, CBC  Imaging:  Consults:  Therapeutics:  Discharge Meds:   Assessment/Plan:  55 year old female presents today with significant abdominal distention, confusion. History of alcoholic cirrhosis with worsening over the last several days. Patient's neighbor's bedside, but has not seen her in 2 weeks. This is off of her baseline, unable to reach her husband by phone. Patient does not display any signs of infectious etiology, chest x-ray shows questionable pneumonia. In the setting of elevated WBC, blood pressure 95/60, tachycardia blood cultures will be drawn, rectal temp obtained and antibiotics started. Patient was likely hepatic encephalopathy with pneumonia. Hospitalist service consult to do to evaluate the patient here in the ED. Fluid bolus pending further sepsis markers.  Hospitalist  service to admit.     New Prescriptions Current Discharge Medication List       Eyvonne MechanicJeffrey Cross Jorge, PA-C 07/21/16 1555

## 2016-07-21 NOTE — ED Provider Notes (Signed)
Medical screening examination/treatment/procedure(s) were conducted as a shared visit with non-physician practitioner(s) and myself.  I personally evaluated the patient during the encounter.   EKG Interpretation None     55 year old female with history of alcoholic cirrhosis presents with confusion. Patient's ammonia level is elevated consistent with hepatic encephalopathy. Patient slightly hypotensive. We'll to both IV fluids. We'll admit to the medicine service   Lorre NickAnthony Aaronjames Kelsay, MD 07/21/16 [phone]

## 2016-07-21 NOTE — H&P (Signed)
History and Physical    Christine NamJanet M Sherman [name]:[id number]:[id number] DOB: [birth year] DOA: 07/21/2016  PCP: Lonia BloodGARBA,LAWAL, MD   Patient coming from: Home  Chief Complaint: Acute encephalopathy and weakness  HPI: Christine [name] is a 55 y.o. female with medical history significant of alcoholic liver disease with reoccurring ascites and repeated paracenteses presents with acute encephalopathy that began a couple of days ago.  Husband, not bedside but on the phone, states this morning patient could not stand.  He states he was up with her all night.  She has been taking her oral lactulose as prescribed per her husband but he says he realized that last night she was not having adequate bowel movements.  Husband and patient's caregiver have also noticed increased abdominal swelling for the past 3 days.  Per patient's husband patient is scheduled for a paracentesis in 3 days from admission but he did not feel she could wait.  Last paracentesis was at end of November and yielded 7L.  Husband states patient's quality of life has decreased substantially due to her recurrent ascites.  Patient has a caregiver at home that stays with her while her husband is at work.    ED Course: Patient seen by EDP.  CXR showed possible pneumonia.  Concern for SBP present but less so as as abdomen not tender or painful and patient is afebrile; blood pressure is low but around patients baseline. Given ceftriaxone and doxycycline in ED.  TR[facility] asked to admit.   Review of Systems: As per HPI otherwise 10 point review of systems negative.    Past Medical History:  Diagnosis Date  . Abnormal Pap smear    displasia had colposcopy  . Anemia   . Arthritis   . Closed right humeral fracture    Humeral head fracture  . Depression    not currently on meds  . GERD (gastroesophageal reflux disease)   . Gout   . Hernia   . Hypertension   . Psoriasis     Past Surgical History:  Procedure Laterality Date  . COLPOSCOPY    . DILATION AND  CURETTAGE OF UTERUS       reports that she quit smoking about 3 months ago. Her smoking use included Cigarettes. She has a 15.00 pack-year smoking history. She has never used smokeless tobacco. She reports that she does not drink alcohol or use drugs.  Allergies  Allergen Reactions  . Ibuprofen Other (See Comments)    Pt is unable to take due to her hernia.    . Sulfa Antibiotics Nausea Only and Rash    Family History  Problem Relation Age of Onset  . Hypertension Mother   . Diabetes Mother   . Hypertension Father   . Diabetes Brother     Prior to Admission medications   Medication Sig Start Date End Date Taking? Authorizing Provider  ALPRAZolam Prudy Feeler(XANAX) 0.5 MG tablet Take 0.5 mg by mouth 4 (four) times daily.    Yes Historical Provider, MD  ferrous sulfate 325 (65 FE) MG tablet Take 325 mg by mouth daily with breakfast.   Yes Historical Provider, MD  furosemide (LASIX) 40 MG tablet Take 80 mg by mouth 2 (two) times daily.    Yes Historical Provider, MD  hydrOXYzine (ATARAX/VISTARIL) 10 MG tablet Take 10 mg by mouth 2 (two) times daily.   Yes Historical Provider, MD  lactulose (CHRONULAC) 10 GM/15ML solution Take 30 mLs (20 g total) by mouth 2 (two) times daily. 04/24/16  Yes Saima Rizwan,  MD  oxyCODONE (OXY IR/ROXICODONE) 5 MG immediate release tablet Take 1 tablet (5 mg total) by mouth every 12 (twelve) hours as needed for moderate pain or severe pain. 04/24/16  Yes Calvert Cantor, MD  potassium chloride SA (K-DUR,KLOR-CON) 20 MEQ tablet Take 20 mEq by mouth 2 (two) times daily.   Yes Historical Provider, MD  spironolactone (ALDACTONE) 100 MG tablet Take 100 mg by mouth daily.   Yes Historical Provider, MD  thiamine 100 MG tablet Take 1 tablet (100 mg total) by mouth daily. 04/25/16  Yes Calvert Cantor, MD  traZODone (DESYREL) 100 MG tablet Take 100 mg by mouth at bedtime.   Yes Historical Provider, MD    Physical Exam: Vitals:   07/21/16 1318 07/21/16 1348 07/21/16 1440 07/21/16 1443    BP: (!) 101/46 (!) 101/46 (!) 88/46 (!) 98/54  Pulse: 102 100 (!) 101   Resp: 18 18 20    Temp:  98 F (36.7 C) 97.7 F (36.5 C)   TempSrc:  Oral Oral   SpO2: 98% 98% 97%   Weight:      Height:          Constitutional: NAD, calm, comfortable Vitals:   07/21/16 1318 07/21/16 1348 07/21/16 1440 07/21/16 1443  BP: (!) 101/46 (!) 101/46 (!) 88/46 (!) 98/54  Pulse: 102 100 (!) 101   Resp: 18 18 20    Temp:  98 F (36.7 C) 97.7 F (36.5 C)   TempSrc:  Oral Oral   SpO2: 98% 98% 97%   Weight:      Height:       Eyes: PERRL, lids and conjunctivae normal ENMT: Mucous membranes are moist. Posterior pharynx clear of any exudate or lesions.Normal dentition.  Neck: normal, supple, no masses, no thyromegaly Respiratory: Normal respiratory effort. No accessory muscle use. Faint crackles in left lower lobe Cardiovascular: Regular rate and rhythm, no murmurs / rubs / gallops. 2+ edema of lower extremities 2+ pedal pulses. No carotid bruits.  Abdomen: + fluid shift, distended no tenderness, no masses palpated. No hepatosplenomegaly. Bowel sounds positive.  Musculoskeletal: unable to assess as patient altered.  Skin: significant dryness and scaling of skin on the lower extremities bilaterally Neurologic: CN 2-12 grossly intact. Sensation intact, DTR normal. Strength 5/5 in all 4.  Psychiatric: unable to assess as patient is altered.    Labs on Admission: I have personally reviewed following labs and imaging studies  CBC:  Recent Labs Lab 07/21/16 1129  WBC 12.5*  NEUTROABS 8.2*  HGB 8.8*  HCT 24.2*  MCV 84.6  PLT 218   Basic Metabolic Panel:  Recent Labs Lab 07/21/16 1129  NA 129*  K 4.8  CL 98*  CO2 21*  GLUCOSE 113*  BUN 36*  CREATININE 1.61*  CALCIUM 7.8*   GFR: Estimated Creatinine Clearance: 42 mL/min (by C-G formula based on SCr of 1.61 mg/dL (H)). Liver Function Tests:  Recent Labs Lab 07/21/16 1129  AST 59*  ALT 24  ALKPHOS 155*  BILITOT 2.8*  PROT  6.6  ALBUMIN 1.8*    Recent Labs Lab 07/21/16 1129  LIPASE 24    Recent Labs Lab 07/21/16 1129  AMMONIA 115*   Coagulation Profile:  Recent Labs Lab 07/21/16 1129  INR 1.68   Cardiac Enzymes: No results for input(s): CKTOTAL, CKMB, CKMBINDEX, TROPONINI in the last 168 hours. BNP (last 3 results) No results for input(s): PROBNP in the last 8760 hours. HbA1C: No results for input(s): HGBA1C in the last 72 hours. CBG: No results  for input(s): GLUCAP in the last 168 hours. Lipid Profile: No results for input(s): CHOL, HDL, LDLCALC, TRIG, CHOLHDL, LDLDIRECT in the last 72 hours. Thyroid Function Tests: No results for input(s): TSH, T4TOTAL, FREET4, T3FREE, THYROIDAB in the last 72 hours. Anemia Panel: No results for input(s): VITAMINB12, FOLATE, FERRITIN, TIBC, IRON, RETICCTPCT in the last 72 hours. Urine analysis:    Component Value Date/Time   COLORURINE YELLOW 07/21/2016 1120   APPEARANCEUR HAZY (A) 07/21/2016 1120   LABSPEC 1.009 07/21/2016 1120   PHURINE 6.0 07/21/2016 1120   GLUCOSEU NEGATIVE 07/21/2016 1120   HGBUR SMALL (A) 07/21/2016 1120   BILIRUBINUR NEGATIVE 07/21/2016 1120   BILIRUBINUR small 12/27/2011 1031   KETONESUR NEGATIVE 07/21/2016 1120   PROTEINUR NEGATIVE 07/21/2016 1120   UROBILINOGEN 0.2 12/27/2011 1031   NITRITE NEGATIVE 07/21/2016 1120   LEUKOCYTESUR LARGE (A) 07/21/2016 1120   Sepsis Labs: !!!!!!!!!!!!!!!!!!!!!!!!!!!!!!!!!!!!!!!!!!!! @LABRCNTIP (procalcitonin:4,lacticidven:4) )No results found for this or any previous visit (from the past 240 hour(s)).   Radiological Exams on Admission: Dg Chest 2 View  Result Date: 07/21/2016 CLINICAL DATA:  Hepatic cirrhosis with ascites and abdominal swelling EXAM: CHEST  2 VIEW COMPARISON:  May 22, 2016 FINDINGS: There is left lower lobe airspace consolidation. Lungs elsewhere clear. Heart is upper normal in size with pulmonary vascularity within normal limits. No adenopathy. There is  degenerative change in each shoulder. IMPRESSION: Left lower lobe airspace consolidation. Lungs elsewhere clear. Cardiac silhouette within normal limits. Electronically Signed   By: Bretta BangWilliam  Woodruff III M.D.   On: 07/21/2016 11:56    EKG: None   Assessment/Plan Active Problems:   Alcoholic cirrhosis of liver with ascites (HCC)   Anemia of chronic disease   Hyperammonemia (HCC)   Lactic acidosis   Pressure injury of skin    Alcoholic cirrhosis of liver with ascites - will order repeat paracentesis - last paracentesis yielded 7L - palliative care consulted - will need to monitor for signs of withdrawal even though ETOH was <5 - not clear when patient's last drink was - clinical suspicion for SBP low but still on differential - BP around patient's baseline, afebrile and cause for encephalopathy explained with elevated ammonia, nevertheless will order fluid analysis at paracentesis - monitor vitals closely - continue ceftriaxone  Pneumonia - faint opacity on CXR - ceftriaxone will cover pneumonia and SBP  Hyperammoniemia - likely cause of encephalopathy - lactulose enemas ordered - can transition to PO lactulose when patient more alert  Lactic acidosis - could be a sign of liver failure    DVT prophylaxis: No prophylaxis at this time Code Status: DNR- discussed with patient's husband  Family Communication: Talked to patient's husband on the phone  Disposition Plan: likely discharge back to previous environment Consults called: none Admission status: Inpatient telemetry   Katrinka BlazingAlex U Kadolph MD [facility] Pager [phone]  If 7PM-7AM, please contact night-coverage [email or web address] Password [email or web address]  07/21/2016, 7:24 PM

## 2016-07-21 NOTE — Progress Notes (Signed)
Lactulose enema given per MD orders. Patient tolerated procedure well, but was only able to retain the solution about 5-10 mins. Patient placed on bedpan and urinated 500cc amber urine. Patient did have a very small amount of soft, round balls of stool. Will continue to monitor for output.

## 2016-07-21 NOTE — Progress Notes (Signed)
Collected UA as clean catch as possible. Had to use a clean bed pan as patient is unable to stand at this time d/t weakness. Patient's skin is very dry and flaky and some was noted to have flaked off in the urine.   Also, Paracentesis will be attempted tomorrow. The patient is normally a large volume paracentesis; 7 Liters were removed last time. BP is already soft at 98/54 manually checked. MD made aware.

## 2016-07-22 ENCOUNTER — Inpatient Hospital Stay (HOSPITAL_COMMUNITY): Payer: BLUE CROSS/BLUE SHIELD

## 2016-07-22 LAB — BODY FLUID CELL COUNT WITH DIFFERENTIAL
LYMPHS FL: 26 %
MONOCYTE-MACROPHAGE-SEROUS FLUID: 61 % (ref 50–90)
NEUTROPHIL FLUID: 13 % (ref 0–25)
WBC FLUID: 206 uL (ref 0–1000)

## 2016-07-22 LAB — COMPREHENSIVE METABOLIC PANEL
ALK PHOS: 131 U/L — AB (ref 38–126)
ALT: 20 U/L (ref 14–54)
ANION GAP: 10 (ref 5–15)
AST: 54 U/L — ABNORMAL HIGH (ref 15–41)
Albumin: 1.6 g/dL — ABNORMAL LOW (ref 3.5–5.0)
BUN: 32 mg/dL — ABNORMAL HIGH (ref 6–20)
CALCIUM: 7.9 mg/dL — AB (ref 8.9–10.3)
CO2: 20 mmol/L — ABNORMAL LOW (ref 22–32)
CREATININE: 1.41 mg/dL — AB (ref 0.44–1.00)
Chloride: 102 mmol/L (ref 101–111)
GFR, EST AFRICAN AMERICAN: 47 mL/min — AB (ref >60)
GFR, EST NON AFRICAN AMERICAN: 41 mL/min — AB (ref >60)
Glucose, Bld: 77 mg/dL (ref 65–99)
Potassium: 4.6 mmol/L (ref 3.5–5.1)
SODIUM: 132 mmol/L — AB (ref 135–145)
Total Bilirubin: 2.6 mg/dL — ABNORMAL HIGH (ref 0.3–1.2)
Total Protein: 6 g/dL — ABNORMAL LOW (ref 6.5–8.1)

## 2016-07-22 LAB — PROTIME-INR
INR: 1.68
PROTHROMBIN TIME: 20 s — AB (ref 11.4–15.2)

## 2016-07-22 LAB — GRAM STAIN

## 2016-07-22 LAB — AMMONIA: AMMONIA: 113 umol/L — AB (ref 9–35)

## 2016-07-22 LAB — LACTATE DEHYDROGENASE, PLEURAL OR PERITONEAL FLUID: LD, Fluid: 62 U/L — ABNORMAL HIGH (ref 3–23)

## 2016-07-22 LAB — PROTEIN, BODY FLUID: Total protein, fluid: <3 g/dL

## 2016-07-22 LAB — GLUCOSE, SEROUS FLUID: Glucose, Fluid: 76 mg/dL

## 2016-07-22 MED ORDER — OXYCODONE HCL 5 MG PO TABS
5.0000 mg | ORAL_TABLET | ORAL | Status: DC | PRN
  Administered 2016-07-22 – 2016-07-30 (×34): 10 mg via ORAL
  Filled 2016-07-22 (×34): qty 2

## 2016-07-22 MED ORDER — OXYCODONE HCL 5 MG PO TABS
10.0000 mg | ORAL_TABLET | Freq: Once | ORAL | Status: AC
  Administered 2016-07-22: 10 mg via ORAL
  Filled 2016-07-22: qty 2

## 2016-07-22 MED ORDER — HYDROCERIN EX CREA
TOPICAL_CREAM | Freq: Two times a day (BID) | CUTANEOUS | Status: DC
  Administered 2016-07-22 – 2016-07-25 (×7): via TOPICAL
  Administered 2016-07-26: 1 via TOPICAL
  Administered 2016-07-26 – 2016-07-28 (×2): via TOPICAL
  Administered 2016-07-30: 1 via TOPICAL
  Filled 2016-07-22 (×2): qty 113

## 2016-07-22 NOTE — Progress Notes (Addendum)
Patient ID: Christine [name], female   DOB: 06/08/1961, 55 y.o.   MRN: [id number]  PROGRESS NOTE    Christine NamJanet M [name]  [id number] DOB: 06/08/1961 DOA: 07/21/2016  PCP: Lonia BloodGARBA,LAWAL, MD   Brief Narrative:  55 y.o. female with medical history significant of alcoholic liver disease with reoccurring ascites and repeated paracenteses [facility] with acute mental status changes over past few days prior to this admission.  CXR on admission showed possible pneumonia. Ammonia level was in 100 range. Concern was for SBP so pt started on empiric rocephin. Paracentesis planned for today.    Assessment & Plan:   Active Problems:   Alcoholic cirrhosis of liver with ascites (HCC) / Abdominal distention  - Last paracentesis yielded 7 L fluid     Anemia of chronic disease - Due to bone marrow suppression form alcohol use - Hgb 8.6.    Left lower lobe pneumonia - Pt on rocephin  - Blood cx are pending - Resp cx not collected on admission and pt already on abx so will defer sputum collection for now unless pt spikes fever or worsening leukocytosis     Hyperammonemia (HCC) / Acute hepatic encephalopathy  - Continue lactulose  - Ammonia level 113    Lactic acidosis / Sepsis / Leukocytosis  - Likely due to liver cirrhosis, possible SBP - Repeat lactic acid in am - Continue Rocephin  - Plan for paracentesis and fluid analysis     LE bilateral skin injury  - WOC consulted     Acute kidney injury  - Possible from sepsis - Continue IV fluids - Follow up BMP in am    DVT prophylaxis: SCD's bilaterally  Code Status: DNR/DNI Family Communication: no family at the bedside this am Disposition Plan: home once pt feels better, anticipate in next 24-48 hours    Consultants:   IR  Procedures:  Plan for paracentesis   Antimicrobials:   Vanco and cefepime given in ED  Rocephin 07/21/2016 -->   Subjective: No overnight events.   Objective: Vitals:   07/22/16 0703 07/22/16  1215 07/22/16 1230 07/22/16 1235  BP: (!) 109/44 (!) 105/53 (!) 91/47 (!) 96/45  Pulse:      Resp:      Temp:      TempSrc:      SpO2:      Weight:      Height:        Intake/Output Summary (Last 24 hours) at 07/22/16 1252 Last data filed at 07/22/16 0600  Gross per 24 hour  Intake              550 ml  Output             2150 ml  Net            -1600 ml   Filed Weights   07/21/16 1317  Weight: 88.5 kg (195 lb)    Examination:  General exam: Appears calm and comfortable  Respiratory system: Clear to auscultation. Respiratory effort normal. Cardiovascular system: S1 & S2 heard, RRR. No JVD, murmurs, rubs, gallops or clicks. No pedal edema. Gastrointestinal system: (+) BS< distended abdomen with fluid wave  Central nervous system: Alert and oriented. No focal neurological deficits. Extremities: Symmetric 5 x 5 power. Skin: Bialteral LE excoriations, redness and +1 LE pitting edema Psychiatry: Judgement and insight appear normal. Mood & affect appropriate.   Data Reviewed: I have personally reviewed following labs and imaging studies  CBC:  Recent Labs  Lab 07/21/16 1129  WBC 12.5*  NEUTROABS 8.2*  HGB 8.8*  HCT 24.2*  MCV 84.6  PLT 218   Basic Metabolic Panel:  Recent Labs Lab 07/21/16 1129 07/22/16 0528  NA 129* 132*  K 4.8 4.6  CL 98* 102  CO2 21* 20*  GLUCOSE 113* 77  BUN 36* 32*  CREATININE 1.61* 1.41*  CALCIUM 7.8* 7.9*   GFR: Estimated Creatinine Clearance: 48 mL/min (by C-G formula based on SCr of 1.41 mg/dL (H)). Liver Function Tests:  Recent Labs Lab 07/21/16 1129 07/22/16 0528  AST 59* 54*  ALT 24 20  ALKPHOS 155* 131*  BILITOT 2.8* 2.6*  PROT 6.6 6.0*  ALBUMIN 1.8* 1.6*    Recent Labs Lab 07/21/16 1129  LIPASE 24    Recent Labs Lab 07/21/16 1129 07/22/16 0528  AMMONIA 115* 113*   Coagulation Profile:  Recent Labs Lab 07/21/16 1129 07/22/16 0621  INR 1.68 1.68   Cardiac Enzymes: No results for input(s): CKTOTAL,  CKMB, CKMBINDEX, TROPONINI in the last 168 hours. BNP (last 3 results) No results for input(s): PROBNP in the last 8760 hours. HbA1C: No results for input(s): HGBA1C in the last 72 hours. CBG: No results for input(s): GLUCAP in the last 168 hours. Lipid Profile: No results for input(s): CHOL, HDL, LDLCALC, TRIG, CHOLHDL, LDLDIRECT in the last 72 hours. Thyroid Function Tests: No results for input(s): TSH, T4TOTAL, FREET4, T3FREE, THYROIDAB in the last 72 hours. Anemia Panel: No results for input(s): VITAMINB12, FOLATE, FERRITIN, TIBC, IRON, RETICCTPCT in the last 72 hours. Urine analysis:    Component Value Date/Time   COLORURINE YELLOW 07/21/2016 1120   APPEARANCEUR HAZY (A) 07/21/2016 1120   LABSPEC 1.009 07/21/2016 1120   PHURINE 6.0 07/21/2016 1120   GLUCOSEU NEGATIVE 07/21/2016 1120   HGBUR SMALL (A) 07/21/2016 1120   BILIRUBINUR NEGATIVE 07/21/2016 1120   BILIRUBINUR small 12/27/2011 1031   KETONESUR NEGATIVE 07/21/2016 1120   PROTEINUR NEGATIVE 07/21/2016 1120   UROBILINOGEN 0.2 12/27/2011 1031   NITRITE NEGATIVE 07/21/2016 1120   LEUKOCYTESUR LARGE (A) 07/21/2016 1120   Sepsis Labs: @LABRCNTIP (procalcitonin:4,lacticidven:4)   )No results found for this or any previous visit (from the past 240 hour(s)).    Radiology Studies: Dg Chest 2 View Result Date: 07/21/2016 Left lower lobe airspace consolidation. Lungs elsewhere clear. Cardiac silhouette within normal limits. Electronically Signed   By: Bretta BangWilliam  Woodruff III M.D.   On: 07/21/2016 11:56    Scheduled Meds: . cefTRIAXone (ROCEPHIN)  IV  2 g Intravenous Q24H  . chlorhexidine  15 mL Mouth Rinse BID  . lactulose  300 mL Rectal BID  . mouth rinse  15 mL Mouth Rinse q12n4p  . sodium chloride flush  3 mL Intravenous Q12H  . thiamine  100 mg Oral Daily   Continuous Infusions:   LOS: 1 day    Time spent: 25 minutes  Greater than 50% of the time spent on counseling and coordinating the care.   Manson PasseyEVINE,  Lilyanne Mcquown, MD [facility] Pager [phone]  If 7PM-7AM, please contact night-coverage [email or web address] Password [email or web address] 07/22/2016, 12:52 PM

## 2016-07-22 NOTE — Consult Note (Signed)
WOC Nurse wound consult note Reason for Consult:Bilateral LE edema, peeling dry skin, no ulcers Wound type:venous insufficiency Pressure Ulcer POA: No Measurement:No open wounds Wound bed:N/A Drainage (amount, consistency, odor) None Periwound: Pale skin, very dry.  Flaking noted in bilateral gaiter area, pretibially Dressing procedure/placement/frequency: I have today implemented a POC consisting of cleansing twice daily with a pH balanced skin cleanser and gently patting dry prior to applying a thin layer of Eucerin cream.  We will seal this in by gently wrapping from toe to knee with a roll gauze dressing, then topping it with light compression (13-16 mmHg) using an ACE bandage.  Elevation via Prevalon Boots and pillows will continue therapy. WOC nursing team will not follow, but will remain available to this patient, the nursing and medical teams.  Please re-consult if needed. Thanks, Ladona MowLaurie Ason Heslin, MSN, RN, GNP, Hans EdenCWOCN, CWON-AP, FAAN  Pager# [phone]

## 2016-07-22 NOTE — Procedures (Addendum)
Ultrasound-guided diagnostic and therapeutic paracentesis performed yielding 5 liters of yellow  fluid. No immediate complications. A portion of the fluid was sent to the lab for preordered studies. Due to hypotension only the above amount of fluid was removed today.

## 2016-07-23 DIAGNOSIS — Z515 Encounter for palliative care: Secondary | ICD-10-CM

## 2016-07-23 DIAGNOSIS — Z7189 Other specified counseling: Secondary | ICD-10-CM

## 2016-07-23 DIAGNOSIS — K7290 Hepatic failure, unspecified without coma: Secondary | ICD-10-CM

## 2016-07-23 LAB — LACTIC ACID, PLASMA
Lactic Acid, Venous: 3.3 mmol/L (ref 0.5–1.9)
Lactic Acid, Venous: 3.5 mmol/L (ref 0.5–1.9)

## 2016-07-23 MED ORDER — SODIUM CHLORIDE 0.9 % IV BOLUS (SEPSIS)
500.0000 mL | Freq: Once | INTRAVENOUS | Status: AC
  Administered 2016-07-23: 500 mL via INTRAVENOUS

## 2016-07-23 MED ORDER — LACTULOSE 10 GM/15ML PO SOLN
30.0000 g | Freq: Two times a day (BID) | ORAL | Status: DC
  Administered 2016-07-23 – 2016-07-28 (×11): 30 g via ORAL
  Filled 2016-07-23 (×11): qty 45

## 2016-07-23 MED ORDER — PANTOPRAZOLE SODIUM 40 MG PO TBEC
40.0000 mg | DELAYED_RELEASE_TABLET | Freq: Every day | ORAL | Status: DC
  Administered 2016-07-23 – 2016-07-30 (×8): 40 mg via ORAL
  Filled 2016-07-23 (×8): qty 1

## 2016-07-23 NOTE — Progress Notes (Signed)
PT Cancellation Note  Patient Details Name: Christine NamJanet M Medeiros MRN: [id number] DOB: 16-Jan-1961   Cancelled Treatment:     Order received , chart reviewed and spoke with patient and husband at bedside. At this time patient wanted to wait to begin therapy and agreeable for me to return today to try again.    Marella BileBRITT, Sang Blount 07/23/2016, 3:23 PM  Marella BileSharron Hatcher Froning, PT Pager: [phone] 07/23/2016

## 2016-07-23 NOTE — Progress Notes (Signed)
Eucerin cream applied to lower legs with new kerlex and an ace bandage per wound orders.

## 2016-07-23 NOTE — Progress Notes (Addendum)
Patient ID: Christine [name], female   DOB: September 29, 1960, 55 y.o.   MRN: [id number]  PROGRESS NOTE    Christine [name]  WUJ:[id number] DOB: 07-Jul-1961 DOA: 07/21/2016  PCP: [name], MD   Brief Narrative:  55 y.o. female with medical history significant of alcoholic liver disease with reoccurring ascites and repeated paracenteses who presented to [facility] with acute mental status changes over past few days prior to this admission.  CXR on admission showed possible pneumonia. Ammonia level was in 100 range. Concern was for SBP so pt started on empiric rocephin. She had paracentesis with 5 L fluids drained.   Assessment & Plan:   Active Problems:   Alcoholic cirrhosis of liver with ascites (HCC) / Abdominal distention  - Paracentesis 07/22/2016 with 5 L fluid removed and sent for analysis     Anemia of chronic disease - Due to bone marrow suppression form alcohol use - Hgb 8.6 - Follow up CBC In am     Left lower lobe pneumonia - Pt on rocephin  - Blood cx negative so far  - Resp cx not collected on admission and pt already on abx so will defer sputum collection for now unless pt spikes fever or worsening leukocytosis     Hyperammonemia (HCC) / Acute hepatic encephalopathy  - Continue lactulose  - Ammonia level 113 - Today she is oriented to time, place and person     Lactic acidosis / Sepsis / Leukocytosis  - Likely due to liver cirrhosis, possible SBP - Obtain lactic acid in am - Continue Rocephin     LE bilateral skin injury  - Appreciate WOC assessment     Acute kidney injury  - Possible from sepsis - Continue IV fluids - Cr improved with IV fluids     DVT prophylaxis: SCD's bilaterally  Code Status: DNR/DNI Family Communication: husband at the bedside this am Disposition Plan: home once pt feels better, anticipate in next 1-2 days    Consultants:   IR  Palliative care   Procedures:  Paracentesis   Antimicrobials:   Vanco and cefepime given in ED  Rocephin  07/21/2016 -->   Subjective: No overnight events.   Objective: Vitals:   07/22/16 1440 07/22/16 2100 07/23/16 0547 07/23/16 1404  BP: (!) 84/42 (!) 94/37 (!) 91/36 (!) 106/42  Pulse: 95 (!) 104 (!) 101 (!) 101  Resp: 20 19 18 19   Temp: 98.7 F (37.1 C) 97.9 F (36.6 C) 97.5 F (36.4 C) 98.2 F (36.8 C)  TempSrc: Oral Oral Oral Oral  SpO2: 99% 98% 100% 97%  Weight:      Height:        Intake/Output Summary (Last 24 hours) at 07/23/16 1634 Last data filed at 07/23/16 1200  Gross per 24 hour  Intake              170 ml  Output             1000 ml  Net             -830 ml   Filed Weights   07/21/16 1317  Weight: 88.5 kg (195 lb)    Examination:  General exam: Appears calm and comfortable, more alert  Respiratory system: Respiratory effort normal. No wheezing  Cardiovascular system: S1 & S2 heard, Rate controlled  Gastrointestinal system: (+) BS, softly distended  Central nervous system: No focal neurological deficits. Extremities: Palpable pulses  Skin: Bialteral LE excoriations, redness and +1 LE pitting edema Psychiatry: Mood &  affect appropriate.   Data Reviewed: I have personally reviewed following labs and imaging studies  CBC:  Recent Labs Lab 07/21/16 1129  WBC 12.5*  NEUTROABS 8.2*  HGB 8.8*  HCT 24.2*  MCV 84.6  PLT 218   Basic Metabolic Panel:  Recent Labs Lab 07/21/16 1129 07/22/16 0528  NA 129* 132*  K 4.8 4.6  CL 98* 102  CO2 21* 20*  GLUCOSE 113* 77  BUN 36* 32*  CREATININE 1.61* 1.41*  CALCIUM 7.8* 7.9*   GFR: Estimated Creatinine Clearance: 48 mL/min (by C-G formula based on SCr of 1.41 mg/dL (H)). Liver Function Tests:  Recent Labs Lab 07/21/16 1129 07/22/16 0528  AST 59* 54*  ALT 24 20  ALKPHOS 155* 131*  BILITOT 2.8* 2.6*  PROT 6.6 6.0*  ALBUMIN 1.8* 1.6*    Recent Labs Lab 07/21/16 1129  LIPASE 24    Recent Labs Lab 07/21/16 1129 07/22/16 0528  AMMONIA 115* 113*   Coagulation Profile:  Recent  Labs Lab 07/21/16 1129 07/22/16 0621  INR 1.68 1.68   Cardiac Enzymes: No results for input(s): CKTOTAL, CKMB, CKMBINDEX, TROPONINI in the last 168 hours. BNP (last 3 results) No results for input(s): PROBNP in the last 8760 hours. HbA1C: No results for input(s): HGBA1C in the last 72 hours. CBG: No results for input(s): GLUCAP in the last 168 hours. Lipid Profile: No results for input(s): CHOL, HDL, LDLCALC, TRIG, CHOLHDL, LDLDIRECT in the last 72 hours. Thyroid Function Tests: No results for input(s): TSH, T4TOTAL, FREET4, T3FREE, THYROIDAB in the last 72 hours. Anemia Panel: No results for input(s): VITAMINB12, FOLATE, FERRITIN, TIBC, IRON, RETICCTPCT in the last 72 hours. Urine analysis:    Component Value Date/Time   COLORURINE YELLOW 07/21/2016 1120   APPEARANCEUR HAZY (A) 07/21/2016 1120   LABSPEC 1.009 07/21/2016 1120   PHURINE 6.0 07/21/2016 1120   GLUCOSEU NEGATIVE 07/21/2016 1120   HGBUR SMALL (A) 07/21/2016 1120   BILIRUBINUR NEGATIVE 07/21/2016 1120   BILIRUBINUR small 12/27/2011 1031   KETONESUR NEGATIVE 07/21/2016 1120   PROTEINUR NEGATIVE 07/21/2016 1120   UROBILINOGEN 0.2 12/27/2011 1031   NITRITE NEGATIVE 07/21/2016 1120   LEUKOCYTESUR LARGE (A) 07/21/2016 1120   Sepsis Labs: @LABRCNTIP (procalcitonin:4,lacticidven:4)   ) Recent Results (from the past 240 hour(s))  Blood culture (routine x 2)     Status: None (Preliminary result)   Collection Time: 07/21/16 12:50 PM  Result Value Ref Range Status   Specimen Description BLOOD LEFT HAND  Final   Special Requests IN PEDIATRIC BOTTLE 5CC  Final   Culture   Final    NO GROWTH 2 DAYS [facility]    Report Status PENDING  Incomplete  Blood culture (routine x 2)     Status: None (Preliminary result)   Collection Time: 07/21/16 12:50 PM  Result Value Ref Range Status   Specimen Description BLOOD BLOOD RIGHT FOREARM  Final   Special Requests BOTTLES DRAWN AEROBIC AND ANAEROBIC 5CC   Final   Culture   Final    NO GROWTH 2 DAYS Performed at [facility]    Report Status PENDING  Incomplete  Culture, body fluid-bottle     Status: None (Preliminary result)   Collection Time: 07/22/16 12:17 PM  Result Value Ref Range Status   Specimen Description FLUID PERITONEAL  Final   Special Requests NONE  Final   Culture   Final    NO GROWTH < 24 HOURS [facility]formed [facility]    Report  Status PENDING  Incomplete  Gram stain     Status: None   Collection Time: 07/22/16 12:17 PM  Result Value Ref Range Status   Specimen Description PERITONEAL  Final   Special Requests NONE  Final   Gram Stain   Final    FEW WBC PRESENT, PREDOMINANTLY MONONUCLEAR NO ORGANISMS SEEN Performed at [facility]    Report Status 07/22/2016 FINAL  Final      Radiology Studies: Dg Chest 2 View Result Date: 07/21/2016 Left lower lobe airspace consolidation. Lungs elsewhere clear. Cardiac silhouette within normal limits. Electronically Signed   By: Bretta BangWilliam  Woodruff III M.D.   On: 07/21/2016 11:56    Scheduled Meds: . cefTRIAXone (ROCEPHIN)  IV  2 g Intravenous Q24H  . chlorhexidine  15 mL Mouth Rinse BID  . hydrocerin   Topical BID  . lactulose  30 g Oral BID  . mouth rinse  15 mL Mouth Rinse q12n4p  . pantoprazole  40 mg Oral Daily  . sodium chloride flush  3 mL Intravenous Q12H  . thiamine  100 mg Oral Daily   Continuous Infusions:   LOS: 2 days    Time spent: 15 minutes  Greater than 50% of the time spent on counseling and coordinating the care.   Manson PasseyEVINE, Elmar Antigua, MD [facility] Pager [phone]  If 7PM-7AM, please contact night-coverage [email or web address] Password [email or web address] 07/23/2016, 4:34 PM

## 2016-07-23 NOTE — Evaluation (Signed)
Physical Therapy Evaluation Patient Details Name: Christine NamJanet M Grand MRN: [id number] DOB: [birth year] Today's Date: 07/23/2016   History of Present Illness  55 y.o.femalewith medical history significant of alcoholic liver disease with reoccurring ascites and repeated paracenteses who [facility] with acute mental status changes over past few days prior to this admission.   Clinical Impression  Pt with great weakness, loss of ROM and decreased ability with all mobility. Pt to benefit from continued PT services while here, as well as continued PT services at SNF in order to work towards returning home.     Follow Up Recommendations SNF    Equipment Recommendations  None recommended by PT (unless they do not have a BSC (3n1) they will need on of those as well. )    Recommendations for Other Services OT consult     Precautions / Restrictions Restrictions Weight Bearing Restrictions: No      Mobility  Bed Mobility Overal bed mobility: Needs Assistance Bed Mobility: Supine to Sit;Sit to Supine     Supine to sit: Mod assist Sit to supine: Mod assist;+2 for physical assistance   General bed mobility comments: mod to Max assist with scooting, however patient inititated it and attempted , just limited flexion due to ascites and belly size.   Transfers Overall transfer level: Needs assistance Equipment used: Rolling walker (2 wheeled) Transfers: Sit to/from Stand Sit to Stand: Mod assist         General transfer comment: assist to progress and move walker and very close to B knees in case they buckled. [facility] , then [facility].   Ambulation/Gait                Stairs            Wheelchair Mobility    Modified Rankin (Stroke Patients Only)       Balance                                             Pertinent Vitals/Pain Pain Assessment: No/denies pain    Home Living Family/patient expects to be  discharged to:: Private residence (however may need ST SNF ) Living Arrangements: Spouse/significant other Available Help at Discharge: Family Type of Home: House Home Access: Stairs to enter   Entergy CorporationEntrance Stairs-Number of Steps: 3+11 Home Layout: One level Home Equipment: Environmental consultantWalker - 2 wheels;Wheelchair - manual Additional Comments: pt unsure of other equipment, however would need 3 n 1 if they do not have one.     Prior Function Level of Independence: Independent with assistive device(s)         Comments: per pateints husband , she has been using RW for a while with help, and up to last few weeks has had a lot of trouble standing and even using RW. He and a neighbor have to bump her up the entire flight of steps to enter the house .      Hand Dominance        Extremity/Trunk Assessment        Lower Extremity Assessment Lower Extremity Assessment: Generalized weakness;LLE deficits/detail;RLE deficits/detail RLE Deficits / Details: 3/5 grossly for all LE , and unable to achieve neutral DF on R foot therefore difficulty with standing and walking as well.  LLE Deficits / Details: 3/5 grossly for all  LE , and unable to achieve DF past neurtral on R foot therefore difficulty with standing and walking as well.        Communication   Communication: No difficulties  Cognition Arousal/Alertness: Awake/alert Behavior During Therapy: WFL for tasks assessed/performed Overall Cognitive Status: Within Functional Limits for tasks assessed                      General Comments      Exercises General Exercises - Lower Extremity Ankle Circles/Pumps: AROM;Both;10 reps;Supine Quad Sets: AAROM;10 reps;Both;Supine Heel Slides: AAROM;10 reps;Supine;Both Hip ABduction/ADduction: AAROM;Both;10 reps;Supine   Assessment/Plan    PT Assessment Patient needs continued PT services  PT Problem List Decreased strength;Decreased range of motion;Decreased activity tolerance;Decreased  mobility          PT Treatment Interventions Gait training;Functional mobility training;Stair training;Therapeutic exercise;Therapeutic activities;Patient/family education    PT Goals (Current goals can be found in the Care Plan section)  Acute Rehab PT Goals Patient Stated Goal: I want to be able to move a little again. I have not been able to lately PT Goal Formulation: With patient Time For Goal Achievement: 08/06/16 Potential to Achieve Goals: Good    Frequency Min 3X/week   Barriers to discharge        Co-evaluation               End of Session Equipment Utilized During Treatment: Gait belt Activity Tolerance: Patient tolerated treatment well Patient left: in bed;with bed alarm set Nurse Communication: Mobility status ([facility] and assisted with hygiene as well. Place soft PRAFOs on B feet for cushion on heels and assit with DF as well. )         Time: 1630-1710 PT Time Calculation (min) (ACUTE ONLY): 40 min   Charges:   PT Evaluation $PT Eval Moderate Complexity: 1 Procedure PT Treatments $Therapeutic Exercise: 8-22 mins $Therapeutic Activity: 8-22 mins   PT G CodesMarella Bile:        Jarel Cuadra 07/23/2016, 5:25 PM Marella BileSharron Azarel Banner, PT Pager: [phone] 07/23/2016

## 2016-07-23 NOTE — Progress Notes (Signed)
CRITICAL VALUE ALERT  Critical value received:  Lactic acid 3.5  Date of notification:  07/23/16  Time of notification:  2049  Critical value read back: Yes   Nurse who received alert:  Sunny SchleinFelicia RN   MD notified (1st page):  Kirtland BouchardK. Schorr   Time of first page: 2115  MD notified (2nd page):  Time of second page:  Responding MD:   Time MD responded:

## 2016-07-23 NOTE — Progress Notes (Addendum)
CRITICAL VALUE ALERT  Critical value received:  Lactic acid = 3.3  Date of notification:  07/23/16  Time of notification:  1805  Critical value read back:Yes.    Nurse who received alert:  Debroah LoopKatie Dunkelberger  MD notified (1st page):  Elisabeth Pigeonevine  Time of first page:  1808  MD notified (2nd page): Elisabeth Pigeonevine  Time of second page: 1855  Responding MD:    Time MD responded:

## 2016-07-23 NOTE — Consult Note (Signed)
Consultation Note Date: 07/23/2016   Patient Name: Christine [name]  DOB: 24-Feb-1961  MRN: [id number]  Age / Sex: 55 y.o., female  PCP: Elwyn Reach, MD Referring Physician: Robbie Lis, MD  Reason for Consultation: Establishing goals of care  HPI/Patient Profile: 55 y.o. female  with past medical history of alcoholic liver disease, recurrent ascites, status post repeated paracentesis admitted on 07/21/2016 with weakness and encephalopathy.   Clinical Assessment and Goals of Care: I met today with patient and her husband.  Reports the most important thing to her as being at home and her family.  They state the doctors been doing a good job explaining things to them, we spent a great deal of time reviewing her clinical course to this point in time.   Her husband endorses that he has been seeing declining changes in her nutrition, functional status, and cognition for the past several weeks.  Reports that she has stopped drinking since her last hospitalization. Their goal is for her to continue to improve her functional status as much as possible. He works in a greenhouse and states that he is going to be busy for the next several weeks prepairing for the spring growing season. He has been having a friend come and stay with her during the day whenever he is not present. Overall, he reports he is concerned about her ability to return home in her current condition. We discussed possibility of rehabilitation on discharge and patient's husband states that he is not opposed to rehabilitation. He does endorse having a bad experience at [facility] where she was in the past and states that he will not allow her to go back to that same facility.  We discussed the nature of end-stage liver disease and talked about the purpose of hospitalization to be to add quality time to her life outside the hospital. She feels that she  benefits when she come to the hospital and wants to continue to do so in the future.  Her goal is to follow-up with Dr. Collene Mares in order to continue conversation about long-term treatment for her cirrhosis. We did discuss today the fact that she has multiple factors that are concerning including hepatic encephalopathy, recurrent ascites, low albumin, elevated bilirubin.  We also discussed that if she continues to display decline in her nutrition, functional status, and cognition despite treatment that this would likely be an indication that her condition continues to worsen despite current therapies and avoiding drinking. I advised him that this is something he can continue to discuss with her outpatient providers as they are currently focused on getting well enough to be out of the hospital and following up with Dr. Collene Mares.  SUMMARY OF RECOMMENDATIONS   - Continue current therapy.  They are planning to follow up with GI as an outpatient. - Her husband thinks she may benefit from skilled rehabilitation upon discharge. They are open to SNF, but not to [facility] (where she was previously). PT consult placed.  Discussed briefly with SW. - Husband is  interested in having dietary consult to evaluate her nutrition and provide guidance on nutrition as he reports not knowing what to feed her.  Code Status/Advance Care Planning:  DNR  Palliative Prophylaxis:   Bowel Regimen  Psycho-social/Spiritual:   Desire for further Chaplaincy support:no  Additional Recommendations: Caregiving  Support/Resources  Prognosis:   Unable to determine  Discharge Planning: To Be Determined      Primary Diagnoses: Present on Admission: . Hyperammonemia ([place]) . Alcoholic cirrhosis of liver with ascites ([place]) . Anemia of chronic disease . Lactic acidosis   I have reviewed the medical record, interviewed the patient and family, and examined the patient. The following aspects are pertinent.  Past Medical History:    Diagnosis Date  . Abnormal Pap smear    displasia had colposcopy  . Anemia   . Arthritis   . Closed right humeral fracture    Humeral head fracture  . Depression    not currently on meds  . GERD (gastroesophageal reflux disease)   . Gout   . Hernia   . Hypertension   . Psoriasis    Social History   Social History  . Marital status: Married    Spouse name: N/A  . Number of children: N/A  . Years of education: N/A   Social History Main Topics  . Smoking status: Former Smoker    Packs/day: 1.00    Years: 15.00    Types: Cigarettes    Quit date: 04/18/2016  . Smokeless tobacco: Never Used     Comment: Admitted to the hospital 04/18/16  . Alcohol use No     Comment: couple glasses liquor day  . Drug use: No     Comment: Alcohol level 04/18/16 was 132 at admission  . Sexual activity: Not Currently   Other Topics Concern  . None   Social History Narrative  . None   Family History  Problem Relation Age of Onset  . Hypertension Mother   . Diabetes Mother   . Hypertension Father   . Diabetes Brother    Scheduled Meds: . cefTRIAXone (ROCEPHIN)  IV  2 g Intravenous Q24H  . chlorhexidine  15 mL Mouth Rinse BID  . hydrocerin   Topical BID  . lactulose  30 g Oral BID  . mouth rinse  15 mL Mouth Rinse q12n4p  . sodium chloride flush  3 mL Intravenous Q12H  . thiamine  100 mg Oral Daily   Continuous Infusions: PRN Meds:.hydrOXYzine, oxyCODONE Medications Prior to Admission:  Prior to Admission medications   Medication Sig Start Date End Date Taking? Authorizing Provider  ALPRAZolam Duanne Moron) 0.5 MG tablet Take 0.5 mg by mouth 4 (four) times daily.    Yes Historical Provider, MD  ferrous sulfate 325 (65 FE) MG tablet Take 325 mg by mouth daily with breakfast.   Yes Historical Provider, MD  furosemide (LASIX) 40 MG tablet Take 80 mg by mouth 2 (two) times daily.    Yes Historical Provider, MD  hydrOXYzine (ATARAX/VISTARIL) 10 MG tablet Take 10 mg by mouth 2 (two) times  daily.   Yes Historical Provider, MD  lactulose (CHRONULAC) 10 GM/15ML solution Take 30 mLs (20 g total) by mouth 2 (two) times daily. 04/24/16  Yes Debbe Odea, MD  oxyCODONE (OXY IR/ROXICODONE) 5 MG immediate release tablet Take 1 tablet (5 mg total) by mouth every 12 (twelve) hours as needed for moderate pain or severe pain. 04/24/16  Yes Debbe Odea, MD  potassium chloride SA (K-DUR,KLOR-CON) 20 MEQ tablet  Take 20 mEq by mouth 2 (two) times daily.   Yes Historical Provider, MD  spironolactone (ALDACTONE) 100 MG tablet Take 100 mg by mouth daily.   Yes Historical Provider, MD  thiamine 100 MG tablet Take 1 tablet (100 mg total) by mouth daily. 04/25/16  Yes Debbe Odea, MD  traZODone (DESYREL) 100 MG tablet Take 100 mg by mouth at bedtime.   Yes Historical Provider, MD   Allergies  Allergen Reactions  . Ibuprofen Other (See Comments)    Pt is unable to take due to her hernia.    . Sulfa Antibiotics Nausea Only and Rash   Review of Systems  Constitutional: Positive for activity change, appetite change and fatigue.  Respiratory: Positive for shortness of breath.   Gastrointestinal: Positive for abdominal distention, abdominal pain and nausea.  Neurological: Positive for weakness.  Psychiatric/Behavioral: Positive for sleep disturbance.   Physical Exam  General: Alert, awake, in no acute distress.  HEENT: No bruits, no goiter, no JVD Heart: Regular rate and rhythm. No murmur appreciated. Lungs: Good air movement, clear Abdomen: Distended, positive bowel sounds.  Ext: No significant edema Neuro: Grossly intact, nonfocal.   Vital Signs: BP (!) 106/42 (BP Location: Left Arm)   Pulse (!) 101   Temp 98.2 F (36.8 C) (Oral)   Resp 19   Ht '5\' 4"'$  (1.626 m)   Wt 88.5 kg (195 lb)   SpO2 97%   BMI 33.47 kg/m  Pain Assessment: 0-10 POSS *See Group Information*: S-Acceptable,Sleep, easy to arouse Pain Score: 8    SpO2: SpO2: 97 % O2 Device:SpO2: 97 % O2 Flow Rate: .   IO:  Intake/output summary:  Intake/Output Summary (Last 24 hours) at 07/23/16 1405 Last data filed at 07/23/16 0600  Gross per 24 hour  Intake               50 ml  Output             1000 ml  Net             -950 ml    LBM: Last BM Date: 07/23/16 Baseline Weight: Weight: 88.5 kg (195 lb) Most recent weight: Weight: 88.5 kg (195 lb)     Palliative Assessment/Data:   Flowsheet Rows   Flowsheet Row Most Recent Value  Intake Tab  Referral Department  Hospitalist  Unit at Time of Referral  Med/Surg Unit  Palliative Care Primary Diagnosis  Other (Comment) [Cirrhosis]  Date Notified  07/22/16  Palliative Care Type  New Palliative care  Date of Admission  07/21/16  Date first seen by Palliative Care  07/22/16  # of days Palliative referral response time  0 Day(s)  # of days IP prior to Palliative referral  1  Clinical Assessment  Palliative Performance Scale Score  30%  Pain Max last 24 hours  9  Pain Min Last 24 hours  0  Psychosocial & Spiritual Assessment  Palliative Care Outcomes  Patient/Family meeting held?  Yes  Who was at the meeting?  Patient, husband  Palliative Care Outcomes  Clarified goals of care      Time In: 1030 Time Out: 1150 Time Total: 80 Greater than 50%  of this time was spent counseling and coordinating care related to the above assessment and plan.  Signed by: Micheline Rough, MD   Please contact Palliative Medicine Team phone at [phone] for questions and concerns.  For individual provider: See Shea Evans

## 2016-07-24 DIAGNOSIS — E43 Unspecified severe protein-calorie malnutrition: Secondary | ICD-10-CM | POA: Insufficient documentation

## 2016-07-24 LAB — BASIC METABOLIC PANEL
ANION GAP: 9 (ref 5–15)
BUN: 27 mg/dL — AB (ref 6–20)
CHLORIDE: 103 mmol/L (ref 101–111)
CO2: 20 mmol/L — ABNORMAL LOW (ref 22–32)
Calcium: 7.5 mg/dL — ABNORMAL LOW (ref 8.9–10.3)
Creatinine, Ser: 1.09 mg/dL — ABNORMAL HIGH (ref 0.44–1.00)
GFR calc non Af Amer: 56 mL/min — ABNORMAL LOW (ref >60)
Glucose, Bld: 103 mg/dL — ABNORMAL HIGH (ref 65–99)
POTASSIUM: 3.3 mmol/L — AB (ref 3.5–5.1)
SODIUM: 132 mmol/L — AB (ref 135–145)

## 2016-07-24 LAB — CBC
HCT: 24.3 % — ABNORMAL LOW (ref 36.0–46.0)
HEMOGLOBIN: 8.5 g/dL — AB (ref 12.0–15.0)
MCH: 29.7 pg (ref 26.0–34.0)
MCHC: 35 g/dL (ref 30.0–36.0)
MCV: 85 fL (ref 78.0–100.0)
Platelets: 175 10*3/uL (ref 150–400)
RBC: 2.86 MIL/uL — AB (ref 3.87–5.11)
RDW: 14.3 % (ref 11.5–15.5)
WBC: 11 10*3/uL — ABNORMAL HIGH (ref 4.0–10.5)

## 2016-07-24 LAB — LACTIC ACID, PLASMA: Lactic Acid, Venous: 3.3 mmol/L (ref 0.5–1.9)

## 2016-07-24 LAB — AMMONIA: AMMONIA: 60 umol/L — AB (ref 9–35)

## 2016-07-24 LAB — MISC LABCORP TEST (SEND OUT): Labcorp test code: 88062

## 2016-07-24 MED ORDER — ENSURE ENLIVE PO LIQD
237.0000 mL | Freq: Two times a day (BID) | ORAL | Status: DC
  Administered 2016-07-24 – 2016-07-30 (×7): 237 mL via ORAL

## 2016-07-24 MED ORDER — ADULT MULTIVITAMIN W/MINERALS CH
1.0000 | ORAL_TABLET | Freq: Every day | ORAL | Status: DC
  Administered 2016-07-24 – 2016-07-30 (×7): 1 via ORAL
  Filled 2016-07-24 (×7): qty 1

## 2016-07-24 MED ORDER — DIPHENHYDRAMINE HCL 25 MG PO CAPS
25.0000 mg | ORAL_CAPSULE | Freq: Four times a day (QID) | ORAL | Status: DC | PRN
  Administered 2016-07-24 – 2016-07-30 (×11): 25 mg via ORAL
  Filled 2016-07-24 (×11): qty 1

## 2016-07-24 MED ORDER — POTASSIUM CHLORIDE CRYS ER 20 MEQ PO TBCR
40.0000 meq | EXTENDED_RELEASE_TABLET | Freq: Once | ORAL | Status: AC
  Administered 2016-07-24: 40 meq via ORAL
  Filled 2016-07-24: qty 2

## 2016-07-24 NOTE — Progress Notes (Addendum)
Patient ID: Christine NamJanet M Popoca, female   DOB: Nov 20, 1960, 55 y.o.   MRN: [id number]  PROGRESS NOTE    Christine [name]  [id number] DOB: Nov 20, 1960 DOA: 07/21/2016  PCP: Lonia BloodGARBA,LAWAL, MD   Brief Narrative:  55 y.o. female with medical history significant of alcoholic liver disease with reoccurring ascites and repeated paracenteses who [facility] with acute mental status changes over past few days prior to this admission.  CXR on admission showed possible pneumonia. Ammonia level was in 100 range. Concern was for SBP so pt started on empiric rocephin. She had paracentesis with 5 L fluids drained.   Assessment & Plan:   Active Problems:   Alcoholic cirrhosis of liver with ascites (HCC) / Abdominal distention  - Last paracentesis 07/02/2016 yielded 7 L fluid  - Paracentesis 07/22/2016 with 5 L fluid removed and sent for analysis (Serum ascites albumin gradient 3 so consistent with cirrhosis) - Abd little more distended this am, she may need another paracentesis in next 1-2 days     Generalized weakness - PT eval - recommendation for SNF - Appreciate SW assisting with discharge planning     Anemia of chronic disease - Due to bone marrow suppression form alcohol use - Hgb 8.5 - Continue to monitor daily CBC    Left lower lobe pneumonia - Pt on rocephin  - Blood cx negative so far  - Resp cx not collected on admission and pt already on abx so will defer sputum collection for now unless pt spikes fever or worsening leukocytosis     Hyperammonemia (HCC) / Acute hepatic encephalopathy  - Continue lactulose  - Ammonia level 113 on admission and this am 60 - Her mental status is much better this am - Her husband says she is depressed but pt did not feel like talking to psychiatrist today so please see in am if if would be okay to have psych see her    Lactic acidosis / Sepsis / Leukocytosis  - Likely due to liver cirrhosis, possible SBP - Lactic acid 3.3 this am - Continue to monitor  daily lactic acid     LE bilateral skin injury / Bilateral LE edema  - Appreciate WOC assessment  - Bilateral LE edema, peeling dry skin, no ulcers. Wound type:venous insufficiency. Periwound: Pale skin, very dry.  Flaking noted in bilateral gaiter area, pretibially - Dressing procedure/placement/frequency: Cleansing twice daily with a pH balanced skin cleanser and gently patting dry prior to applying a thin layer of Eucerin cream.  Gently wrapping from toe to knee with a roll gauze dressing, then topping it with light compression (13-16 mmHg) using an ACE bandage.  Elevation via Prevalon Boots and pillows will continue therapy.    Acute kidney injury  - Possible from sepsis - Continue IV fluids - Cr improved with IV fluids     Hyponatremia - Due to liver cirrhosis - Sodium 132     Hypokalemia - Supplemented - Follow up BMP in am    Severe protein calorie malnutrition - Due to liver cirrhosis, low albumin  - Diet as tolerated     DVT prophylaxis: SCD's bilaterally  Code Status: DNR/DNI Family Communication: husband at the bedside this am Disposition Plan: home once pt feels better, anticipate in next 1-2 days    Consultants:   IR  Palliative care   SW  PT  Nutrition   Procedures:  Paracentesis 12/19 - 5 L fluid removed   Antimicrobials:   Vanco and cefepime given in  ED  Rocephin 07/21/2016 -->   Subjective: No overnight events.   Objective: Vitals:   07/23/16 0547 07/23/16 1404 07/23/16 2216 07/24/16 0624  BP: (!) 91/36 (!) 106/42 (!) 94/41 (!) 96/41  Pulse: (!) 101 (!) 101 (!) 107 (!) 107  Resp: 18 19 20 18   Temp: 97.5 F (36.4 C) 98.2 F (36.8 C) 97.8 F (36.6 C) 98.5 F (36.9 C)  TempSrc: Oral Oral Oral Oral  SpO2: 100% 97% 100% 100%  Weight:      Height:        Intake/Output Summary (Last 24 hours) at 07/24/16 1158 Last data filed at 07/24/16 1610  Gross per 24 hour  Intake              810 ml  Output                1 ml  Net               809 ml   Filed Weights   07/21/16 1317  Weight: 88.5 kg (195 lb)    Examination:  General exam: No acute distress  Respiratory system: Respiratory effort normal. No wheezing, no rhonchi  Cardiovascular system: S1 & S2 heard, Rate controlled  Gastrointestinal system: (+) BS, more distended this am Central nervous system: Nonfocal  Extremities: Palpable pulses. LE edema +1 with skin peeling  Skin: Bialteral LE excoriations, redness and +1 LE pitting edema Psychiatry: Normal mood and behavior   Data Reviewed: I have personally reviewed following labs and imaging studies  CBC:  Recent Labs Lab 07/21/16 1129 07/24/16 0518  WBC 12.5* 11.0*  NEUTROABS 8.2*  --   HGB 8.8* 8.5*  HCT 24.2* 24.3*  MCV 84.6 85.0  PLT 218 175   Basic Metabolic Panel:  Recent Labs Lab 07/21/16 1129 07/22/16 0528 07/24/16 0518  NA 129* 132* 132*  K 4.8 4.6 3.3*  CL 98* 102 103  CO2 21* 20* 20*  GLUCOSE 113* 77 103*  BUN 36* 32* 27*  CREATININE 1.61* 1.41* 1.09*  CALCIUM 7.8* 7.9* 7.5*   GFR: Estimated Creatinine Clearance: 62 mL/min (by C-G formula based on SCr of 1.09 mg/dL (H)). Liver Function Tests:  Recent Labs Lab 07/21/16 1129 07/22/16 0528  AST 59* 54*  ALT 24 20  ALKPHOS 155* 131*  BILITOT 2.8* 2.6*  PROT 6.6 6.0*  ALBUMIN 1.8* 1.6*    Recent Labs Lab 07/21/16 1129  LIPASE 24    Recent Labs Lab 07/21/16 1129 07/22/16 0528 07/24/16 0840  AMMONIA 115* 113* 60*   Coagulation Profile:  Recent Labs Lab 07/21/16 1129 07/22/16 0621  INR 1.68 1.68   Cardiac Enzymes: No results for input(s): CKTOTAL, CKMB, CKMBINDEX, TROPONINI in the last 168 hours. BNP (last 3 results) No results for input(s): PROBNP in the last 8760 hours. HbA1C: No results for input(s): HGBA1C in the last 72 hours. CBG: No results for input(s): GLUCAP in the last 168 hours. Lipid Profile: No results for input(s): CHOL, HDL, LDLCALC, TRIG, CHOLHDL, LDLDIRECT in the last 72  hours. Thyroid Function Tests: No results for input(s): TSH, T4TOTAL, FREET4, T3FREE, THYROIDAB in the last 72 hours. Anemia Panel: No results for input(s): VITAMINB12, FOLATE, FERRITIN, TIBC, IRON, RETICCTPCT in the last 72 hours. Urine analysis:    Component Value Date/Time   COLORURINE YELLOW 07/21/2016 1120   APPEARANCEUR HAZY (A) 07/21/2016 1120   LABSPEC 1.009 07/21/2016 1120   PHURINE 6.0 07/21/2016 1120   GLUCOSEU NEGATIVE 07/21/2016 1120   HGBUR SMALL (  A) 07/21/2016 1120   BILIRUBINUR NEGATIVE 07/21/2016 1120   BILIRUBINUR small 12/27/2011 1031   KETONESUR NEGATIVE 07/21/2016 1120   PROTEINUR NEGATIVE 07/21/2016 1120   UROBILINOGEN 0.2 12/27/2011 1031   NITRITE NEGATIVE 07/21/2016 1120   LEUKOCYTESUR LARGE (A) 07/21/2016 1120   Sepsis Labs: @LABRCNTIP (procalcitonin:4,lacticidven:4)   ) Recent Results (from the past 240 hour(s))  Blood culture (routine x 2)     Status: None (Preliminary result)   Collection Time: 07/21/16 12:50 PM  Result Value Ref Range Status   Specimen Description BLOOD LEFT HAND  Final   Special Requests IN PEDIATRIC BOTTLE 5CC  Final   Culture   Final    NO GROWTH 2 DAYS Performed at [facility]    Report Status PENDING  Incomplete  Blood culture (routine x 2)     Status: None (Preliminary result)   Collection Time: 07/21/16 12:50 PM  Result Value Ref Range Status   Specimen Description BLOOD BLOOD RIGHT FOREARM  Final   Special Requests BOTTLES DRAWN AEROBIC AND ANAEROBIC 5CC  Final   Culture   Final    NO GROWTH 2 DAYS Performed at [facility]    Report Status PENDING  Incomplete  Culture, body fluid-bottle     Status: None (Preliminary result)   Collection Time: 07/22/16 12:17 PM  Result Value Ref Range Status   Specimen Description FLUID PERITONEAL  Final   Special Requests NONE  Final   Culture   Final    NO GROWTH < 24 HOURS Performed at [facility]    Report Status PENDING  Incomplete  Gram stain      Status: None   Collection Time: 07/22/16 12:17 PM  Result Value Ref Range Status   Specimen Description PERITONEAL  Final   Special Requests NONE  Final   Gram Stain   Final    FEW WBC PRESENT, PREDOMINANTLY MONONUCLEAR NO ORGANISMS SEEN Performed at [facility]    Report Status 07/22/2016 FINAL  Final      Radiology Studies: Dg Chest 2 View Result Date: 07/21/2016 Left lower lobe airspace consolidation. Lungs elsewhere clear. Cardiac silhouette within normal limits. Electronically Signed   By: Bretta BangWilliam  Woodruff III M.D.   On: 07/21/2016 11:56    Scheduled Meds: . cefTRIAXone (ROCEPHIN)  IV  2 g Intravenous Q24H  . chlorhexidine  15 mL Mouth Rinse BID  . feeding supplement (ENSURE ENLIVE)  237 mL Oral BID BM  . hydrocerin   Topical BID  . lactulose  30 g Oral BID  . mouth rinse  15 mL Mouth Rinse q12n4p  . multivitamin with minerals  1 tablet Oral Daily  . pantoprazole  40 mg Oral Daily  . sodium chloride flush  3 mL Intravenous Q12H  . thiamine  100 mg Oral Daily   Continuous Infusions:   LOS: 3 days    Time spent: 25 minutes  Greater than 50% of the time spent on counseling and coordinating the care.   Manson PasseyEVINE, Keaunna Skipper, MD [facility] Pager [phone]  If 7PM-7AM, please contact night-coverage [email or web address] Password [email or web address] 07/24/2016, 11:58 AM

## 2016-07-24 NOTE — Clinical Social Work Placement (Signed)
   CLINICAL SOCIAL WORK PLACEMENT  NOTE  Date:  07/24/2016  Patient Details  Name: Joaquim NamJanet M Mahaney MRN: [id number] Date of Birth: 1960-12-20  Clinical Social Work is seeking post-discharge placement for this patient at the Skilled  Nursing Facility level of care (*CSW will initial, date and re-position this form in  chart as items are completed):  Yes   Patient/family provided with [place] Clinical Social Work Department's list of facilities offering this level of care within the geographic area requested by the patient (or if unable, by the patient's family).  Yes   Patient/family informed of their freedom to choose among providers that offer the needed level of care, that participate in Medicare, Medicaid or managed care program needed by the patient, have an available bed and are willing to accept the patient.  Yes   Patient/family informed of [place]'s ownership interest in [facility] and [facility], as well as of the fact that they are under no obligation to receive care at these facilities.  PASRR submitted to EDS on       PASRR number received on       Existing PASRR number confirmed on 07/24/16     FL2 transmitted to all facilities in geographic area requested by pt/family on 07/24/16     FL2 transmitted to all facilities within larger geographic area on       Patient informed that his/her managed care company has contracts with or will negotiate with certain facilities, including the following:            Patient/family informed of bed offers received.  Patient chooses bed at       Physician recommends and patient chooses bed at      Patient to be transferred to   on  .  Patient to be transferred to facility by       Patient family notified on   of transfer.  Name of family member notified:        PHYSICIAN       Additional Comment:    _______________________________________________ Arlyss RepressHarrison, Gailene Youkhana F, LCSW 07/24/2016, 10:47 AM

## 2016-07-24 NOTE — Progress Notes (Signed)
CRITICAL VALUE ALERT  Critical value received:  Lactic acid 3.3  Date of notification:  07/24/16  Time of notification:  0605  Critical value read back: Yes   Nurse who received alert:  Governor SpeckingJerry Sussie Minor RN   MD notified (1st page):  Kirtland BouchardK. Schorr  Time of first page:  0608   MD notified (2nd page):  Time of second page:  Responding MD:    Time MD responded:

## 2016-07-24 NOTE — Clinical Social Work Note (Signed)
Clinical Social Work Assessment  Patient Details  Name: Christine [name] MRN: [id number] Date of Birth: 1961-03-31  Date of referral:  07/24/16               Reason for consult:  Facility Placement                Permission sought to share information with:  Christine [name] Permission granted to share information::  Yes, Verbal Permission Granted  Name::        Agency::     Relationship::     Contact Information:     Housing/Transportation Living arrangements for the past 2 months:  Single Family Home Source of Information:  Patient, Friend/Neighbor Patient Interpreter Needed:  None Criminal Activity/Legal Involvement Pertinent to Current Situation/Hospitalization:  No - Comment as needed Significant Relationships:  Friend, Spouse Lives with:  Spouse Do you feel safe going back to the place where you live?  No Need for family participation in patient care:  Yes (Comment)  Care giving concerns:  CSW reviewed PT evaluation recommending SNF at discharge.    Social Worker assessment / plan:  CSW spoke with patient & friend at bedside who states that she had been to a SNF - [facility] for about 3 weeks back [place], but would like to see what other facilities have availability.   Employment status:    Health and safety inspectornsurance information:  Managed Care PT Recommendations:  Skilled Nursing Facility Information / Referral to community resources:  Skilled Nursing Facility  Patient/Family's Response to care:  Patient states that she would really prefer to return home, but does not feel strong enough at this time - is hoping to work with PT again today to work on getting out of bed. CSW left message for patient's husband, Christine [name] as patient states that she'd want to discuss it with him before making a decision. Patient agreeable to have information sent out to see what facilities have availability & are in-network with BCBS.   Patient/Family's Understanding of and Emotional  Response to Diagnosis, Current Treatment, and Prognosis:  Patient states that she feels exhausted, has not been getting good sleep here in the hospital.   Emotional Assessment Appearance:  Appears stated age Attitude/Demeanor/Rapport:    Affect (typically observed):    Orientation:  Oriented to Self, Oriented to Place, Oriented to  Time, Oriented to Situation Alcohol / Substance use:    Psych involvement (Current and /or in the community):     Discharge Needs  Concerns to be addressed:    Readmission within the last 30 days:    Current discharge risk:    Barriers to Discharge:      Arlyss RepressHarrison, Eilan Mcinerny F, LCSW 07/24/2016, 10:45 AM

## 2016-07-24 NOTE — Progress Notes (Signed)
Pt refuses daily skin care to bilateral LEs. Pt educated on importance of cleansing and properly wrapping extremities. Pt in prevalon boots at this time.

## 2016-07-24 NOTE — Clinical Social Work Placement (Signed)
Patient's [facility], facility to submit clinicals for [facility]. CSW has completed FL2 & will continue to follow and assist with discharge when ready.    Lincoln MaxinKelly Deandre Brannan, LCSW [facility] Clinical Social Worker cell #: [phone]     CLINICAL SOCIAL WORK PLACEMENT  NOTE  Date:  07/24/2016  Patient Details  Name: Christine NamJanet M [place]st MRN: [id number] Date of Birth: 03-Mar-1961  Clinical Social Work is seeking post-discharge placement for this patient at the Skilled  Nursing Facility level of care (*CSW will initial, date and re-position this form in  chart as items are completed):  Yes   Patient/family provided with [place] Clinical Social Work Department's list of facilities offering this level of care within the geographic area requested by the patient (or if unable, by the patient's family).  Yes   Patient/family informed of their freedom to choose among providers that offer the needed level of care, that participate in Medicare, Medicaid or managed care program needed by the patient, have an available bed and are willing to accept the patient.  Yes   Patient/family informed of [place]'s ownership interest in [facility] and [facility], as well as of the fact that they are under no obligation to receive care at these facilities.  PASRR submitted to EDS on       PASRR number received on       Existing PASRR number confirmed on 07/24/16     FL2 transmitted to all facilities in geographic area requested by pt/family on 07/24/16     FL2 transmitted to all facilities within larger geographic area on       Patient informed that his/her managed care company has contracts with or will negotiate with certain facilities, including the following:        Yes   Patient/family informed of bed offers received.  [facility]     Physician recommends and patient  chooses bed at      [facility] on  .  Patient to be transferred to facility by       Patient family notified on   of transfer.  Name of family member notified:        PHYSICIAN       Additional Comment:    _______________________________________________ Arlyss RepressHarrison, Tag Wurtz F, LCSW 07/24/2016, 4:05 PM

## 2016-07-24 NOTE — NC FL2 (Signed)
[place] MEDICAID FL2 LEVEL OF CARE SCREENING TOOL     IDENTIFICATION  Patient Name: Christine [name] Birthdate: Jun 26, 1961 Sex: female Admission Date (Current Location): 07/21/2016  [facility] [place]   Facility and Address:  [facility],  [address], [place] [id number]      Provider Number: [id number]  Attending Physician Name and Address:  Alison MurrayAlma M Devine, MD  Relative Name and Phone Number:       Current Level of Care: Hospital Recommended Level of Care: Skilled Nursing Facility Prior Approval Number:    Date Approved/Denied:   PASRR Number:   [id number] [id number]  Discharge Plan: SNF    Current Diagnoses: Patient Active Problem List   Diagnosis Date Noted  . Pressure injury of skin 07/21/2016  . Scoliosis 04/29/2016  . History of gout 04/29/2016  . Psoriasis 04/29/2016  . Folic acid deficiency 04/24/2016  . Obesity 04/24/2016  . Hypokalemia 04/19/2016  . Alcohol abuse 04/19/2016  . Abnormal liver function 04/19/2016  . Acute alcohol intoxication (HCC) 04/19/2016  . Alcoholic cirrhosis of liver with ascites (HCC) 04/19/2016  . Anemia of chronic disease 04/19/2016  . Hyperammonemia (HCC) 04/19/2016  . Troponin level elevated 04/19/2016  . Lactic acidosis 04/19/2016    Orientation RESPIRATION BLADDER Height & Weight     Self, Time, Situation, Place  Normal Continent Weight: 195 lb (88.5 kg) Height:  5\' 4"  (162.6 cm)  BEHAVIORAL SYMPTOMS/MOOD NEUROLOGICAL BOWEL NUTRITION STATUS      Continent Diet (Regular)  AMBULATORY STATUS COMMUNICATION OF NEEDS Skin   Extensive Assist Verbally PU Stage and Appropriate Care (Pressure Injury 07/21/16 Stage II -  Partial thickness loss of dermis presenting as a shallow open ulcer with a red, pink wound bed without slough. (right buttock))                       Personal Care Assistance Level of Assistance  Bathing, Dressing Bathing Assistance: Limited assistance   Dressing Assistance:  Limited assistance     Functional Limitations Info             SPECIAL CARE FACTORS FREQUENCY  PT (By licensed PT), OT (By licensed OT)     PT Frequency: 5 OT Frequency: 5            Contractures      Additional Factors Info  Code Status, Allergies Code Status Info: (P) DNR Allergies Info: (P) Ibuprofen, Sulfa Antibiotics           Current Medications (07/24/2016):  This is the current hospital active medication list Current Facility-Administered Medications  Medication Dose Route Frequency Provider Last Rate Last Dose  . cefTRIAXone (ROCEPHIN) 2 g in dextrose 5 % 50 mL IVPB  2 g Intravenous Q24H Filbert SchilderAlexandria U Kadolph, MD   2 g at 07/23/16 2333  . chlorhexidine (PERIDEX) 0.12 % solution 15 mL  15 mL Mouth Rinse BID Filbert SchilderAlexandria U Kadolph, MD   15 mL at 07/24/16 0953  . diphenhydrAMINE (BENADRYL) capsule 25 mg  25 mg Oral Q6H PRN Alison MurrayAlma M Devine, MD   25 mg at 07/24/16 0953  . hydrocerin (EUCERIN) cream   Topical BID Alison MurrayAlma M Devine, MD      . lactulose [facility](CHRONULAC) 10 GM/15ML solution 30 g  30 g Oral BID Alison MurrayAlma M Devine, MD   30 g at 07/24/16 [phone]  . MEDLINE mouth rinse  15 mL Mouth Rinse q12n4p Filbert SchilderAlexandria U Kadolph, MD   15 mL at  07/23/16 1759  . oxyCODONE (Oxy IR/ROXICODONE) immediate release tablet 5-10 mg  5-10 mg Oral Q4H PRN Leanne ChangKatherine P Schorr, NP   10 mg at 07/24/16 0815  . pantoprazole (PROTONIX) EC tablet 40 mg  40 mg Oral Daily Alison MurrayAlma M Devine, MD   40 mg at 07/24/16 0953  . sodium chloride flush (NS) 0.9 % injection 3 mL  3 mL Intravenous Q12H Filbert SchilderAlexandria U Kadolph, MD   3 mL at 07/24/16 0955  . thiamine (VITAMIN B-1) tablet 100 mg  100 mg Oral Daily Filbert SchilderAlexandria U Kadolph, MD   100 mg at 07/24/16 [id number]     Discharge Medications: Please see discharge summary for a list of discharge medications.  Relevant Imaging Results:  Relevant Lab Results:   Additional Information (P) SS # [id number]  Christine [name], Christine Klinge F, LCSW

## 2016-07-24 NOTE — Progress Notes (Signed)
PT Cancellation Note  Patient Details Name: Christine NamJanet M Brandle MRN: [id number] DOB: Mar 26, 1961   Cancelled Treatment:    Reason Eval/Treat Not Completed: Fatigue/lethargy limiting ability to participate Pt states she did not sleep last night and only wishes to rest at this time.  Offered to check back this afternoon however pt states she would prefer tomorrow.   Lyrick Worland,KATHrine E 07/24/2016, 11:23 AM Zenovia JarredKati Recia Sons, PT, DPT 07/24/2016 Pager: [phone]

## 2016-07-24 NOTE — Progress Notes (Addendum)
Initial Nutrition Assessment  DOCUMENTATION CODES:   Severe malnutrition in context of chronic illness  INTERVENTION:   Ensure Enlive po BID, each supplement provides 350 kcal and 20 grams of protein  MVI daily   NUTRITION DIAGNOSIS:   Malnutrition related to catabolic illness, poor appetite as evidenced by severe depletion of body fat, severe depletion of muscle mass.  GOAL:   Patient will meet greater than or equal to 90% of their needs  MONITOR:   PO intake, Supplement acceptance  REASON FOR ASSESSMENT:   Consult Assessment of nutrition requirement/status  ASSESSMENT:   55 y.o. female with medical history significant of alcoholic liver disease with reoccurring ascites and repeated paracenteses presents with acute encephalopathy that began a couple of days ago.   Met with pt in room today. Pt reports that her appetite has been declining over the past few weeks. Pt reports abdominal distention and feeling of fullness after eating. Pt is currently eating 75-100% meals in hospital. Pt complaining of indigestion and diarrhea today. 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 Pt has distended abdomen but denies abdominal plain; but is complaining that she itches where her skin was stretched from distension. Per chart pt has wt functionations r/t fluid changes. Pt had paracentesis on this admission where 5L of fluid was removed. Pt reports that she usually weighs ~270lbs and that she had lost about 20lbs earlier this year.   Spoke with pt's husband about nutrition for this pt. Advised balanced meals with high quality/nutrient dense foods and healthy proteins. Gave handouts to reinforce material that included some meal examples.   Medications reviewed and include: ceftriaxone, lactulose, protonix, thiamine, oxycodone  Labs reviewed: Na 132(L), K 3.3(L), CO2 20(L), BUN 27(H), creat 1.09(H), Ca 7.5(L), lactic acid 3.3(H) Ammonia 113(H) 12/19, tbili 2.6(H) 12/19, alb 1.6(L) 12/19 WBC  11(H), Hgb 8.5(L), Hct 24.3(L)   Nutrition-Focused physical exam completed. Findings are severe fat depletion, severe muscle depletion, and moderate edema. Ascites in abdomen.   Diet Order:  Diet regular Room service appropriate? Yes; Fluid consistency: Thin  Skin:  Wound (see comment) (Stage II buttocks)  Last BM:  12/21-diarrhea 2/2 lactulose   Height:   Ht Readings from Last 1 Encounters:  07/21/16 _0  (1.626 m)    Weight:   Wt Readings from Last 1 Encounters:  07/21/16 195 lb (88.5 kg)    Ideal Body Weight:  54.5 kg  BMI:  Body mass index is 33.47 kg/m.  Estimated Nutritional Needs:   Kcal:  2200-2500kcal/day (UBW-77kg used)  Protein:  108-124g/day (UBW-77kg used)  Fluid:  per MD discretion   EDUCATION NEEDS:   Education needs addressed  Koleen Distance, RD, LDN

## 2016-07-25 LAB — CBC
HEMATOCRIT: 25.4 % — AB (ref 36.0–46.0)
Hemoglobin: 8.9 g/dL — ABNORMAL LOW (ref 12.0–15.0)
MCH: 30.4 pg (ref 26.0–34.0)
MCHC: 35 g/dL (ref 30.0–36.0)
MCV: 86.7 fL (ref 78.0–100.0)
PLATELETS: 181 10*3/uL (ref 150–400)
RBC: 2.93 MIL/uL — AB (ref 3.87–5.11)
RDW: 14.5 % (ref 11.5–15.5)
WBC: 14.2 10*3/uL — AB (ref 4.0–10.5)

## 2016-07-25 LAB — BASIC METABOLIC PANEL
ANION GAP: 7 (ref 5–15)
BUN: 24 mg/dL — ABNORMAL HIGH (ref 6–20)
CALCIUM: 7.6 mg/dL — AB (ref 8.9–10.3)
CO2: 19 mmol/L — AB (ref 22–32)
Chloride: 104 mmol/L (ref 101–111)
Creatinine, Ser: 0.94 mg/dL (ref 0.44–1.00)
GLUCOSE: 107 mg/dL — AB (ref 65–99)
POTASSIUM: 4.5 mmol/L (ref 3.5–5.1)
Sodium: 130 mmol/L — ABNORMAL LOW (ref 135–145)

## 2016-07-25 LAB — PH, BODY FLUID: pH, Body Fluid: 7.8

## 2016-07-25 LAB — LACTIC ACID, PLASMA: Lactic Acid, Venous: 2.3 mmol/L (ref 0.5–1.9)

## 2016-07-25 NOTE — Progress Notes (Signed)
Christine [name]                                                                                                                                                                                                           Daily Progress Note   Patient Name: Christine [name]       Date: 07/25/2016 DOB: Sep 20, 1960  Age: 55 y.o. MRN#: [id number] Attending Physician: Patrecia Pour, MD Primary Care Physician: Barbette Merino, MD Admit Date: 07/21/2016  Reason for Consultation/Follow-up: Establishing goals of care  Subjective: Met today with Ms. Christine [name] and her friend at bedside.  She reports feeling better today.  She remains invested in plan to discharge to SNF and f/u with GI.  Reports some discomfort in her belly and has reaccumulated fluid.  She reported "not yet" when asked about possible repeat paracentesis.  Length of Stay: 4  Current Medications: Scheduled Meds:  . cefTRIAXone (ROCEPHIN)  IV  2 g Intravenous Q24H  . chlorhexidine  15 mL Mouth Rinse BID  . feeding supplement (ENSURE ENLIVE)  237 mL Oral BID BM  . hydrocerin   Topical BID  . lactulose  30 g Oral BID  . mouth rinse  15 mL Mouth Rinse q12n4p  . multivitamin with minerals  1 tablet Oral Daily  . pantoprazole  40 mg Oral Daily  . sodium chloride flush  3 mL Intravenous Q12H  . thiamine  100 mg Oral Daily    Continuous Infusions:   PRN Meds: diphenhydrAMINE, oxyCODONE  Physical Exam    General exam: No acute distress  Respiratory system: Respiratory effort normal. No wheezing.  Cardiovascular system: Regular rate and rhythm.  Gastrointestinal system: distended with + fluid wave.  She has reaccumulated since having paracentesis. Central nervous system: Nonfocal  Extremities: Palpable pulses. LE edema Skin: Bialteral LE excoriations, redness Psychiatry: Normal mood and behavior        Vital Signs: BP 112/63 (BP Location: Left Arm)   Pulse (!) 108   Temp 98.6 F (37 C) (Oral)   Resp 20   Ht '5\' 4"'$  (1.626 m)   Wt 88.5 kg (195  lb)   SpO2 96%   BMI 33.47 kg/m  SpO2: SpO2: 96 % O2 Device: O2 Device: Not Delivered O2 Flow Rate:    Intake/output summary:  Intake/Output Summary (Last 24 hours) at 07/25/16 1416 Last data filed at 07/25/16 0930  Gross per 24 hour  Intake              410 ml  Output  0 ml  Net              410 ml   LBM: Last BM Date: 07/24/16 Baseline Weight: Weight: 88.5 kg (195 lb) Most recent weight: Weight: 88.5 kg (195 lb)       Palliative Assessment/Data:    Flowsheet Rows   Flowsheet Row Most Recent Value  Intake Tab  Referral Department  Hospitalist  Unit at Time of Referral  Med/Surg Unit  Palliative Care Primary Diagnosis  Other (Comment) [Cirrhosis]  Date Notified  07/22/16  Palliative Care Type  New Palliative care  Date of Admission  07/21/16  Date first seen by Palliative Care  07/22/16  # of days Palliative referral response time  0 Day(s)  # of days IP prior to Palliative referral  1  Clinical Assessment  Palliative Performance Scale Score  30%  Pain Max last 24 hours  9  Pain Min Last 24 hours  0  Psychosocial & Spiritual Assessment  Palliative Care Outcomes  Patient/Family meeting held?  Yes  Who was at the meeting?  Patient, husband  Palliative Care Outcomes  Clarified goals of care      Patient Active Problem List   Diagnosis Date Noted  . Protein-calorie malnutrition, severe 07/24/2016  . Pressure injury of skin 07/21/2016  . Scoliosis 04/29/2016  . History of gout 04/29/2016  . Psoriasis 04/29/2016  . Folic acid deficiency 18/29/9371  . Obesity 04/24/2016  . Hypokalemia 04/19/2016  . Alcohol abuse 04/19/2016  . Abnormal liver function 04/19/2016  . Acute alcohol intoxication ([place]) 04/19/2016  . Alcoholic cirrhosis of liver with ascites ([place]) 04/19/2016  . Anemia of chronic disease 04/19/2016  . Hyperammonemia ([place]) 04/19/2016  . Troponin level elevated 04/19/2016  . Lactic acidosis 04/19/2016    Palliative Care Assessment &  Plan   Patient Profile: 55 y.o. female  with past medical history of alcoholic liver disease, recurrent ascites, status post repeated paracentesis admitted on 07/21/2016 with weakness and encephalopathy.   Recommendations/Plan:  Her plan is to transition to SNF for rehab and to f/u with Dr. Collene Mares (GI).  I discussed with her husband on 12/20 the concern that she has continued to progress in loss of nutrition and functional status.  We discussed that this may indicative of continued, irreversible decline.  They want to see how she does with rehab prior to making any further changes to care plan.  She is reaccumulating.  We discussed repeat paracentesis today.  She is not opposed to having another procedure, but reports that she does not feel that she is so full that she needs it again right now.  I would recommend reassessing this daily and I believe she would benefit from repeat paracentesis prior to discharge.     Code Status:    Code Status Orders        Start     Ordered   07/21/16 1503  Do not attempt resuscitation (DNR)  Continuous    Question Answer Comment  In the event of cardiac or respiratory ARREST Do not call a "code blue"   In the event of cardiac or respiratory ARREST Do not perform Intubation, CPR, defibrillation or ACLS   In the event of cardiac or respiratory ARREST Use medication by any route, position, wound care, and other measures to relive pain and suffering. May use oxygen, suction and manual treatment of airway obstruction as needed for comfort.      07/21/16 1502    Code Status History  Date Active Date Inactive Code Status Order ID Comments User Context   04/19/2016  3:29 AM 04/25/2016  3:30 PM Full Code [id number]  Jani Gravel, MD Inpatient       Prognosis:   Unable to determine  Discharge Planning:  [place] for rehab with Palliative care service follow-up  Care plan was discussed with Dr. Bonner Puna  Thank you for allowing the Palliative  Medicine Team to assist in the care of this patient.   Time In: 1105 Time Out: 1130 Total Time 25 Prolonged Time Billed No      Greater than 50%  of this time was spent counseling and coordinating care related to the above assessment and plan.  Christine Rough, MD  Please contact Palliative Medicine Team phone at [phone] for questions and concerns.

## 2016-07-25 NOTE — Progress Notes (Signed)
Physical Therapy Treatment Patient Details Name: Christine [name] MRN: [id number] DOB: February 03, 1961 Today's Date: 07/25/2016    History of Present Illness 55 y.o.femalewith medical history significant of alcoholic liver disease with reoccurring ascites and repeated paracenteses [facility] acute mental status changes over past few days prior to this admission.     PT Comments    [facility]. The [facility]. Continue PT while in acute care.   Follow Up Recommendations  SNF;Supervision/Assistance - 24 hour     Equipment Recommendations  None recommended by PT    Recommendations for Other Services OT consult     Precautions / Restrictions Precautions Precautions: Fall    Mobility  Bed Mobility   Bed Mobility: Rolling Rolling: Min assist   Supine to sit: Mod assist Sit to supine: Mod assist;Max assist   General bed mobility comments: assist with buttocks and  trunk to upright position use of bed pad., extra time , assist with legs and trunk  back onto bed with decreased control of trunk lying back.  Transfers Overall transfer level: Needs assistance Equipment used: Rolling walker (2 wheeled) Transfers: Sit to/from [employer];Stand Pivot Transfers Sit to Stand: Mod assist Stand pivot transfers: Mod assist       General transfer comment: [facility] but unable to utilize to [facility]. sat back down then performed a bear hug like  stand and pivot with patient reaching [facility], then performed simmilar back to bed, using bed rail for support.  Ambulation/Gait                 Stairs            Wheelchair Mobility    Modified Rankin (Stroke Patients Only)       Balance                                    Cognition Arousal/Alertness: Awake/alert Behavior During Therapy: WFL for tasks assessed/performed Overall Cognitive Status: Within Functional Limits for tasks  assessed                      Exercises      General Comments        Pertinent Vitals/Pain Pain Assessment: 0-10 Pain Score: 6  Pain Location: back Pain Descriptors / Indicators: Aching Pain Intervention(s): Monitored during session;Premedicated before session;Repositioned    Home Living                      Prior Function            PT Goals (current goals can now be found in the care plan section) Progress towards PT goals: Progressing toward goals    Frequency    Min 3X/week      PT Plan Current plan remains appropriate    Co-evaluation             End of Session   Activity Tolerance: Patient tolerated treatment well Patient left: in bed;with call bell/phone within reach;with bed alarm set     Time: 1557-1630 PT Time Calculation (min) (ACUTE ONLY): 33 min  Charges:  $Therapeutic Activity: 23-37 mins                    G Codes:      Sevastian Witczak, Jobe IgoKaren Elizabeth  07/25/2016, 4:58 PM Blanchard KelchKaren Evellyn Tuff PT [phone]

## 2016-07-25 NOTE — Progress Notes (Addendum)
Pt with continued refusal of wrapping of B/L LE's. Pt allows cleansing, but refuses to be wrapped. Pt  Cooperates in wearing of the Prevalon boots.  Lab Staff with critical Lactic Acid lab value this morning at 2.3, Noted trending down from yesterday's value noted at 3.3. Will f/u with day shift medical staff.

## 2016-07-25 NOTE — Progress Notes (Addendum)
Patient ID: Christine [name], female   DOB: [birth year], 55 y.o.   MRN: [id number]  PROGRESS NOTE    Christine [name]  NWG:[id number] DOB: 1961-07-09 DOA: 07/21/2016  PCP: [name], MD   Brief Narrative:  55 y.o. female with medical history significant of alcoholic liver disease with reoccurring ascites and repeated paracenteses who presented to [facility] with acute mental status changes over past few days prior to this admission.  CXR on admission showed possible pneumonia. Ammonia level was in 100 range. Concern was for SBP so pt started on empiric rocephin. She had paracentesis with 5 L fluids drained.   Assessment & Plan: Alcoholic cirrhosis of liver with ascites: s/p paracentesis 11/29 (7L) and 12/19 (5L, SAAG ~3 consistent with cirrhosis). Hyponatremia stable and no thrombocytopenia. AST/ALT 54/20 in toxicity/EtOH pattern. Synthetic function impaired with INR 1.68, albumin 1.6.  - Distention slowly worsening, no dyspnea. Cr improving.  - Suspect repeat therapeutic thoracentesis will be required in next 3-4 days.  - Monitor serial abd exam, low threshold to add anaerobic coverage for SBP (flagyl 500mg  IV q8h) if tender or if leukocytosis does not resolve. Had non-diagnostic WBCs (< 250 PMNs/mm3) on para. - Considering albumin given renal failure, elevated BUN, but Cr improving with IV fluids alone.  - Plan to follow up with Dr. [name] as outpatient  Generalized weakness - PT eval - recommendation for SNF, CSW attempting placement, but insurance offices closed today through 12/25.  - OOB to chair - Continue PT  Anemia of chronic disease: Due to bone marrow suppression form alcohol use, at baseline.  - Continue to monitor daily CBC  Left lower lobe pneumonia - Ceftriaxone 12/18 > 12/24  - Blood cx: NGTD, sputum culture not obtained - Incentive spirometry due to risk of hypoinflation with ascites  Acute hepatic encephalopathy: Largely resolved since admission.   - Continue lactulose  -  Ammonia level 113 on admission > 60 on 12/21 - Will continue to offer psychiatry services, pt declines for now.  Lactic acidosis / Sepsis / Leukocytosis: Due to pneumonia vs. SBP  - Continue to monitor daily lactic acid: If not cleared, will add SBP coverage.   LE bilateral skin injury related to venous insufficiency:  - Appreciate WOC assessment: Cleansing twice daily with a pH balanced skin cleanser and gently patting dry prior to applying a thin layer of Eucerin cream. Gently wrapping from toe to knee with a roll gauze dressing, then [name] it with light compression (13-16 mmHg) using an ACE bandage. Elevation via Prevalon Boots and pillows will continue therapy.  Acute kidney injury: Improving, anticipate ongoing improvement to premorbid baseline ( SCr < 0.30) with IV fluids for dehydration/sepsis. Possibly hepatorenal syndrome.  - Continue IV fluids  Hypokalemia - Supplemented - Follow up BMP  Severe protein calorie malnutrition: Also a cause of hypoalbuminemia along with cirrhosis.  - Diet as tolerated   DVT prophylaxis: SCD's bilaterally  Code Status: DNR/DNI Family Communication: Best friend at bedside per pt's wishes Disposition Plan: SNF pending bed availability.   Consultants:   IR  Palliative care   SW  PT  Nutrition   Procedures:  Paracentesis 12/19 - 5 L fluid removed   Antimicrobials:   Vanco and cefepime given in ED  Rocephin 07/21/2016 -->   Subjective: Abdomen larger but not painful. No fevers, feels generally weak. No chest pain or dyspnea.   Objective: Vitals:   07/24/16 2052 07/24/16 2213 07/25/16 0613 07/25/16 1515  BP:  97/64 112/63 (!) 123/54  Pulse:  (!) 108 (!) 108 (!) 108  Resp:  20 20 19   Temp:  98.8 F (37.1 C) 98.6 F (37 C) 99.1 F (37.3 C)  TempSrc:  Oral Oral Oral  SpO2: 99% 99% 96% 98%  Weight:      Height:        Intake/Output Summary (Last 24 hours) at 07/25/16 1558 Last data filed at 07/25/16 1517  Gross per 24  hour  Intake              530 ml  Output                0 ml  Net              530 ml   Filed Weights   07/21/16 1317  Weight: 88.5 kg (195 lb)    Examination:  General exam: No acute distress  Respiratory system: Respiratory effort normal. No wheezing, no rhonchi  Cardiovascular system: S1 & S2 heard, Rate controlled  Gastrointestinal system: (+) BS, distended without tenderness or rebound.  Central nervous system: Nonfocal  Extremities: Palpable pulses. LE edema +1 with skin peeling  Skin: Bilateral LE excoriations, redness and +1 LE pitting edema, boots in place. Diffuse dry skin. Psychiatry: Normal mood and behavior   Data Reviewed: I have personally reviewed following labs and imaging studies  CBC:  Recent Labs Lab 07/21/16 1129 07/24/16 0518 07/25/16 0428  WBC 12.5* 11.0* 14.2*  NEUTROABS 8.2*  --   --   HGB 8.8* 8.5* 8.9*  HCT 24.2* 24.3* 25.4*  MCV 84.6 85.0 86.7  PLT 218 175 181   Basic Metabolic Panel:  Recent Labs Lab 07/21/16 1129 07/22/16 0528 07/24/16 0518 07/25/16 0428  NA 129* 132* 132* 130*  K 4.8 4.6 3.3* 4.5  CL 98* 102 103 104  CO2 21* 20* 20* 19*  GLUCOSE 113* 77 103* 107*  BUN 36* 32* 27* 24*  CREATININE 1.61* 1.41* 1.09* 0.94  CALCIUM 7.8* 7.9* 7.5* 7.6*   GFR: Estimated Creatinine Clearance: 71.9 mL/min (by C-G formula based on SCr of 0.94 mg/dL). Liver Function Tests:  Recent Labs Lab 07/21/16 1129 07/22/16 0528  AST 59* 54*  ALT 24 20  ALKPHOS 155* 131*  BILITOT 2.8* 2.6*  PROT 6.6 6.0*  ALBUMIN 1.8* 1.6*    Recent Labs Lab 07/21/16 1129  LIPASE 24    Recent Labs Lab 07/21/16 1129 07/22/16 0528 07/24/16 0840  AMMONIA 115* 113* 60*   Coagulation Profile:  Recent Labs Lab 07/21/16 1129 07/22/16 0621  INR 1.68 1.68   Cardiac Enzymes: No results for input(s): CKTOTAL, CKMB, CKMBINDEX, TROPONINI in the last 168 hours. BNP (last 3 results) No results for input(s): PROBNP in the last 8760  hours. HbA1C: No results for input(s): HGBA1C in the last 72 hours. CBG: No results for input(s): GLUCAP in the last 168 hours. Lipid Profile: No results for input(s): CHOL, HDL, LDLCALC, TRIG, CHOLHDL, LDLDIRECT in the last 72 hours. Thyroid Function Tests: No results for input(s): TSH, T4TOTAL, FREET4, T3FREE, THYROIDAB in the last 72 hours. Anemia Panel: No results for input(s): VITAMINB12, FOLATE, FERRITIN, TIBC, IRON, RETICCTPCT in the last 72 hours. Urine analysis:    Component Value Date/Time   COLORURINE YELLOW 07/21/2016 1120   APPEARANCEUR HAZY (A) 07/21/2016 1120   LABSPEC 1.009 07/21/2016 1120   PHURINE 6.0 07/21/2016 1120   GLUCOSEU NEGATIVE 07/21/2016 1120   HGBUR SMALL (A) 07/21/2016 1120   BILIRUBINUR NEGATIVE 07/21/2016 1120   BILIRUBINUR small  12/27/2011 1031   KETONESUR NEGATIVE 07/21/2016 1120   PROTEINUR NEGATIVE 07/21/2016 1120   UROBILINOGEN 0.2 12/27/2011 1031   NITRITE NEGATIVE 07/21/2016 1120   LEUKOCYTESUR LARGE (A) 07/21/2016 1120   Sepsis Labs: @LABRCNTIP (procalcitonin:4,lacticidven:4)   ) Recent Results (from the past 240 hour(s))  Blood culture (routine x 2)     Status: None (Preliminary result)   Collection Time: 07/21/16 12:50 PM  Result Value Ref Range Status   Specimen Description BLOOD LEFT HAND  Final   Special Requests IN PEDIATRIC BOTTLE 5CC  Final   Culture   Final    NO GROWTH 4 DAYS Performed at [facility]    Report Status PENDING  Incomplete  Blood culture (routine x 2)     Status: None (Preliminary result)   Collection Time: 07/21/16 12:50 PM  Result Value Ref Range Status   Specimen Description BLOOD BLOOD RIGHT FOREARM  Final   Special Requests BOTTLES DRAWN AEROBIC AND ANAEROBIC 5CC  Final   Culture   Final    NO GROWTH 4 DAYS Performed at [facility] [place]    Report Status PENDING  Incomplete  Culture, body fluid-bottle     Status: None (Preliminary result)   Collection Time: 07/22/16 12:17 PM  Result  Value Ref Range Status   Specimen Description FLUID PERITONEAL  Final   Special Requests NONE  Final   Culture   Final    NO GROWTH 3 DAYS [facility]    Report Status PENDING  Incomplete  Gram stain     Status: None   Collection Time: 07/22/16 12:17 PM  Result Value Ref Range Status   Specimen Description PERITONEAL  Final   Special Requests NONE  Final   Gram Stain   Final    FEW WBC PRESENT, PREDOMINANTLY MONONUCLEAR NO ORGANISMS SEEN Performed at [facility]     Report Status 07/22/2016 FINAL  Final      Radiology Studies: Dg Chest 2 View Result Date: 07/21/2016 Left lower lobe airspace consolidation. Lungs elsewhere clear. Cardiac silhouette within normal limits. Electronically Signed   By: Bretta BangWilliam  Woodruff III M.D.   On: 07/21/2016 11:56    Scheduled Meds: . cefTRIAXone (ROCEPHIN)  IV  2 g Intravenous Q24H  . chlorhexidine  15 mL Mouth Rinse BID  . feeding supplement (ENSURE ENLIVE)  237 mL Oral BID BM  . hydrocerin   Topical BID  . lactulose  30 g Oral BID  . mouth rinse  15 mL Mouth Rinse q12n4p  . multivitamin with minerals  1 tablet Oral Daily  . pantoprazole  40 mg Oral Daily  . sodium chloride flush  3 mL Intravenous Q12H  . thiamine  100 mg Oral Daily   Continuous Infusions:   LOS: 4 days   Time spent: 25 minutes  Greater than 50% of the time spent on counseling and coordinating the care.  Hazeline Junkeryan Derrius Furtick, MD [facility] Pager [phone]  If 7PM-7AM, please contact night-coverage [email or web address] Password [email or web address] 07/25/2016, 3:58 PM

## 2016-07-26 DIAGNOSIS — R1084 Generalized abdominal pain: Secondary | ICD-10-CM

## 2016-07-26 LAB — CBC
HCT: 25.7 % — ABNORMAL LOW (ref 36.0–46.0)
Hemoglobin: 8.6 g/dL — ABNORMAL LOW (ref 12.0–15.0)
MCH: 28.8 pg (ref 26.0–34.0)
MCHC: 33.5 g/dL (ref 30.0–36.0)
MCV: 86 fL (ref 78.0–100.0)
PLATELETS: 188 10*3/uL (ref 150–400)
RBC: 2.99 MIL/uL — ABNORMAL LOW (ref 3.87–5.11)
RDW: 14.5 % (ref 11.5–15.5)
WBC: 15.1 10*3/uL — ABNORMAL HIGH (ref 4.0–10.5)

## 2016-07-26 LAB — CULTURE, BLOOD (ROUTINE X 2)
Culture: NO GROWTH
Culture: NO GROWTH

## 2016-07-26 LAB — BASIC METABOLIC PANEL
Anion gap: 5 (ref 5–15)
BUN: 24 mg/dL — AB (ref 6–20)
CALCIUM: 7.7 mg/dL — AB (ref 8.9–10.3)
CO2: 22 mmol/L (ref 22–32)
CREATININE: 0.89 mg/dL (ref 0.44–1.00)
Chloride: 103 mmol/L (ref 101–111)
GFR calc Af Amer: >60 mL/min (ref >60)
GLUCOSE: 90 mg/dL (ref 65–99)
Potassium: 4.5 mmol/L (ref 3.5–5.1)
SODIUM: 130 mmol/L — AB (ref 135–145)

## 2016-07-26 LAB — LACTIC ACID, PLASMA: LACTIC ACID, VENOUS: 1.6 mmol/L (ref 0.5–1.9)

## 2016-07-26 MED ORDER — METRONIDAZOLE 500 MG PO TABS
500.0000 mg | ORAL_TABLET | Freq: Three times a day (TID) | ORAL | Status: DC
  Administered 2016-07-26 – 2016-07-30 (×13): 500 mg via ORAL
  Filled 2016-07-26 (×13): qty 1

## 2016-07-26 MED ORDER — FENTANYL 12 MCG/HR TD PT72
12.5000 ug | MEDICATED_PATCH | TRANSDERMAL | Status: DC
  Administered 2016-07-26 – 2016-07-29 (×2): 12.5 ug via TRANSDERMAL
  Filled 2016-07-26 (×2): qty 1

## 2016-07-26 MED ORDER — METRONIDAZOLE 500 MG PO TABS: 500.0000 mg | ORAL_TABLET | Freq: Three times a day (TID) | ORAL | Status: DC

## 2016-07-26 NOTE — Progress Notes (Addendum)
Patient ID: Christine [name], female   DOB: February 29, 1961, 55 y.o.   MRN: [id number]  PROGRESS NOTE  Christine [name]  PJoaquim NamR:[id number] DOB: February 29, 1961 DOA: 07/21/2016  PCP: Lonia BloodGARBA,LAWAL, MD  Brief Narrative:  55 y.o. female with medical history significant of alcoholic liver disease with reoccurring ascites and repeated paracenteses who presented [facility] with acute mental status changes over past few days prior to this admission.  CXR on admission showed possible pneumonia. Ammonia level was in 100 range. Concern was for SBP so pt started on empiric rocephin. She had paracentesis with 5 L fluids drained.  Assessment & Plan: Alcoholic cirrhosis of liver with ascites: s/p paracentesis 11/29 (7L) and 12/19 (5L, SAAG ~3 consistent with cirrhosis). Hyponatremia stable and no thrombocytopenia. AST/ALT 54/20 in toxicity/EtOH pattern. Synthetic function impaired with INR 1.68, albumin 1.6.  - Distention slowly worsening, no dyspnea. Cr improving.  - Suspect repeat therapeutic thoracentesis will be required in next 3-4 days.  - Monitor serial abd exam.  - Adding anaerobic coverage for SBP (flagyl 500mg  IV q8h) since leukocytosis has not improved. Had non-diagnostic WBCs (< 250 PMNs/mm3) on para. - Considered albumin given renal failure, elevated BUN, but Cr improving with IV fluids alone.  - Plan to follow up with Dr. Loreta AveMann as outpatient - Appreciate palliative care team addressing potential irreversible decompensation and pain management. Discussed with Dr. Neale BurlyFreeman, plan to start fentanyl patch low dose and titrate based on po oxycodone use.   Generalized weakness - PT eval - recommendation for SNF, CSW attempting placement, but insurance offices closed through 12/25.  - OOB to chair - Continue PT  Anemia of chronic disease: Due to bone marrow suppression form alcohol use, at baseline.  - Continue to monitor daily CBC  Left lower lobe pneumonia - Ceftriaxone 12/18 > 12/24  - Blood cx: NGTD, sputum culture  not obtained - Incentive spirometry due to risk of hypoinflation with ascites  Acute hepatic encephalopathy: Largely resolved since admission.   - Continue lactulose  - Ammonia level 113 on admission > 60 on 12/21  LE bilateral skin injury related to venous insufficiency:  - Appreciate WOC assessment: Cleansing twice daily with a pH balanced skin cleanser and gently patting dry prior to applying a thin layer of Eucerin cream. Gently wrapping from toe to knee with a roll gauze dressing, then topping it with light compression (13-16 mmHg) using an ACE bandage. Elevation via Prevalon Boots and pillows will continue therapy.  Acute kidney injury: Improving, anticipate ongoing improvement to premorbid baseline ( SCr < 0.30) with IV fluids for dehydration/sepsis. Possibly hepatorenal syndrome.  - Continue IV fluids  Hypokalemia - Supplemented - Follow up BMP  Severe protein calorie malnutrition: Also a cause of hypoalbuminemia along with cirrhosis.  - Diet as tolerated   DVT prophylaxis: SCD's bilaterally  Code Status: DNR/DNI Family Communication: Husband at bedside Disposition Plan: SNF pending bed availability.   Consultants:   IR  Palliative care   SW  PT  Nutrition   Procedures:  Paracentesis 12/19 - 5 L fluid removed   Antimicrobials:   Vanco and cefepime given in ED  Rocephin 07/21/2016 -->  Flagyl 12/23 > 12/27   Subjective: Abdomen about the same with pain. No fevers, feels generally weak. No chest pain or dyspnea.   Objective: Vitals:   07/25/16 1515 07/25/16 2044 07/26/16 0512 07/26/16 1507  BP: (!) 123/54 (!) 108/41 (!) 111/54 117/67  Pulse: (!) 108 (!) 104 (!) 104 98  Resp: 19 18 18  18  Temp: 99.1 F (37.3 C) 99.2 F (37.3 C) 98.7 F (37.1 C) 98.1 F (36.7 C)  TempSrc: Oral Oral Oral Oral  SpO2: 98% 97% 99% 98%  Weight:      Height:        Intake/Output Summary (Last 24 hours) at 07/26/16 1652 Last data filed at 07/25/16 2305  Gross per  24 hour  Intake               50 ml  Output                0 ml  Net               50 ml   Filed Weights   07/21/16 1317  Weight: 88.5 kg (195 lb)    Examination:  General exam: No acute distress  Respiratory system: Respiratory effort normal. No wheezing, no rhonchi  Cardiovascular system: S1 & S2 heard, Rate controlled  Gastrointestinal system: (+) BS, distended without tenderness or rebound.  Central nervous system: Alert, oriented, nonfocal  Extremities: Palpable pulses. LE edema +1 with skin peeling  Skin: Bilateral LE excoriations, redness and +1 LE pitting edema, boots in place. Diffuse dry skin. Psychiatry: Normal mood and behavior   Data Reviewed: I have personally reviewed following labs and imaging studies  CBC:  Recent Labs Lab 07/21/16 1129 07/24/16 0518 07/25/16 0428 07/26/16 0606  WBC 12.5* 11.0* 14.2* 15.1*  NEUTROABS 8.2*  --   --   --   HGB 8.8* 8.5* 8.9* 8.6*  HCT 24.2* 24.3* 25.4* 25.7*  MCV 84.6 85.0 86.7 86.0  PLT 218 175 181 188   Basic Metabolic Panel:  Recent Labs Lab 07/21/16 1129 07/22/16 0528 07/24/16 0518 07/25/16 0428 07/26/16 0606  NA 129* 132* 132* 130* 130*  K 4.8 4.6 3.3* 4.5 4.5  CL 98* 102 103 104 103  CO2 21* 20* 20* 19* 22  GLUCOSE 113* 77 103* 107* 90  BUN 36* 32* 27* 24* 24*  CREATININE 1.61* 1.41* 1.09* 0.94 0.89  CALCIUM 7.8* 7.9* 7.5* 7.6* 7.7*   GFR: Estimated Creatinine Clearance: 76 mL/min (by C-G formula based on SCr of 0.89 mg/dL). Liver Function Tests:  Recent Labs Lab 07/21/16 1129 07/22/16 0528  AST 59* 54*  ALT 24 20  ALKPHOS 155* 131*  BILITOT 2.8* 2.6*  PROT 6.6 6.0*  ALBUMIN 1.8* 1.6*    Recent Labs Lab 07/21/16 1129  LIPASE 24    Recent Labs Lab 07/21/16 1129 07/22/16 0528 07/24/16 0840  AMMONIA 115* 113* 60*   Coagulation Profile:  Recent Labs Lab 07/21/16 1129 07/22/16 0621  INR 1.68 1.68   Cardiac Enzymes: No results for input(s): CKTOTAL, CKMB, CKMBINDEX, TROPONINI  in the last 168 hours. BNP (last 3 results) No results for input(s): PROBNP in the last 8760 hours. HbA1C: No results for input(s): HGBA1C in the last 72 hours. CBG: No results for input(s): GLUCAP in the last 168 hours. Lipid Profile: No results for input(s): CHOL, HDL, LDLCALC, TRIG, CHOLHDL, LDLDIRECT in the last 72 hours. Thyroid Function Tests: No results for input(s): TSH, T4TOTAL, FREET4, T3FREE, THYROIDAB in the last 72 hours. Anemia Panel: No results for input(s): VITAMINB12, FOLATE, FERRITIN, TIBC, IRON, RETICCTPCT in the last 72 hours. Urine analysis:    Component Value Date/Time   COLORURINE YELLOW 07/21/2016 1120   APPEARANCEUR HAZY (A) 07/21/2016 1120   LABSPEC 1.009 07/21/2016 1120   PHURINE 6.0 07/21/2016 1120   GLUCOSEU NEGATIVE 07/21/2016 1120   HGBUR  SMALL (A) 07/21/2016 1120   BILIRUBINUR NEGATIVE 07/21/2016 1120   BILIRUBINUR small 12/27/2011 1031   KETONESUR NEGATIVE 07/21/2016 1120   PROTEINUR NEGATIVE 07/21/2016 1120   UROBILINOGEN 0.2 12/27/2011 1031   NITRITE NEGATIVE 07/21/2016 1120   LEUKOCYTESUR LARGE (A) 07/21/2016 1120   Sepsis Labs: @LABRCNTIP (procalcitonin:4,lacticidven:4)   ) Recent Results (from the past 240 hour(s))  Blood culture (routine x 2)     Status: None   Collection Time: 07/21/16 12:50 PM  Result Value Ref Range Status   Specimen Description BLOOD LEFT HAND  Final   Special Requests IN PEDIATRIC BOTTLE 5CC  Final   Culture   Final    NO GROWTH 5 DAYS Performed at [facility]    Report Status 07/26/2016 FINAL  Final  Blood culture (routine x 2)     Status: None   Collection Time: 07/21/16 12:50 PM  Result Value Ref Range Status   Specimen Description BLOOD BLOOD RIGHT FOREARM  Final   Special Requests BOTTLES DRAWN AEROBIC AND ANAEROBIC 5CC  Final   Culture   Final    NO GROWTH 5 DAYS Performed at [facility]    Report Status 07/26/2016 FINAL  Final  Culture, body fluid-bottle     Status: None  (Preliminary result)   Collection Time: 07/22/16 12:17 PM  Result Value Ref Range Status   Specimen Description FLUID PERITONEAL  Final   Special Requests NONE  Final   Culture   Final    NO GROWTH 4 DAYS Performed at [facility]    Report Status PENDING  Incomplete  Gram stain     Status: None   Collection Time: 07/22/16 12:17 PM  Result Value Ref Range Status   Specimen Description PERITONEAL  Final   Special Requests NONE  Final   Gram Stain   Final    FEW WBC PRESENT, PREDOMINANTLY MONONUCLEAR NO ORGANISMS SEEN Performed at [facility]    Report Status 07/22/2016 FINAL  Final      Radiology Studies: Dg Chest 2 View Result Date: 07/21/2016 Left lower lobe airspace consolidation. Lungs elsewhere clear. Cardiac silhouette within normal limits. Electronically Signed   By: Bretta Bang III M.D.   On: 07/21/2016 11:56    Scheduled Meds: . cefTRIAXone (ROCEPHIN)  IV  2 g Intravenous Q24H  . chlorhexidine  15 mL Mouth Rinse BID  . feeding supplement (ENSURE ENLIVE)  237 mL Oral BID BM  . fentaNYL  12.5 mcg Transdermal Q72H  . hydrocerin   Topical BID  . lactulose  30 g Oral BID  . mouth rinse  15 mL Mouth Rinse q12n4p  . multivitamin with minerals  1 tablet Oral Daily  . pantoprazole  40 mg Oral Daily  . sodium chloride flush  3 mL Intravenous Q12H  . thiamine  100 mg Oral Daily   Continuous Infusions:   LOS: 5 days   Time spent: 25 minutes  Greater than 50% of the time spent on counseling and coordinating the care.  Hazeline Junker, MD [facility] Pager [phone]  If 7PM-7AM, please contact night-coverage [email or web address] Password [facility] 07/26/2016, 4:52 PM

## 2016-07-26 NOTE — Progress Notes (Signed)
[name]                                                                                                                                                                                                           Daily Progress Note   Patient Name: Christine [name]       Date: 07/26/2016 DOB: Aug 16, 1960  Age: 55 y.o. MRN#: [id number] Attending Physician: Patrecia Pour, MD Primary Care Physician: Barbette Merino, MD Admit Date: 07/21/2016  Reason for Consultation/Follow-up: Establishing goals of care  Subjective: Met today with Ms. Holaway and her husband at bedside.  She reports feeling better today.  She remains invested in plan to discharge to SNF and f/u with GI.  Reports she has been having global abdominal pain that improves with oxycodone.  Her use had been 79m over the past several days, but this increased to 632mover last 24 hours.  She reports that pain has not changed, but she realized that she could get medication more frequently and requested is more often.  Length of Stay: 5  Current Medications: Scheduled Meds:  . cefTRIAXone (ROCEPHIN)  IV  2 g Intravenous Q24H  . chlorhexidine  15 mL Mouth Rinse BID  . feeding supplement (ENSURE ENLIVE)  237 mL Oral BID BM  . hydrocerin   Topical BID  . lactulose  30 g Oral BID  . mouth rinse  15 mL Mouth Rinse q12n4p  . multivitamin with minerals  1 tablet Oral Daily  . pantoprazole  40 mg Oral Daily  . sodium chloride flush  3 mL Intravenous Q12H  . thiamine  100 mg Oral Daily    Continuous Infusions:   PRN Meds: diphenhydrAMINE, oxyCODONE  Physical Exam    General exam: No acute distress  Respiratory system: Respiratory effort normal. No wheezing.  Cardiovascular system: Regular rate and rhythm.  Gastrointestinal system: distended with + fluid wave.  She has reaccumulated since having paracentesis. Central nervous system: Nonfocal  Extremities: Palpable pulses. LE edema Skin: Bialteral LE excoriations, redness Psychiatry: Normal  mood and behavior        Vital Signs: BP (!) 111/54 (BP Location: Right Arm)   Pulse (!) 104   Temp 98.7 F (37.1 C) (Oral)   Resp 18   Ht 5' 4" (1.626 m)   Wt 88.5 kg (195 lb)   SpO2 99%   BMI 33.47 kg/m  SpO2: SpO2: 99 % O2 Device: O2 Device: Not Delivered O2 Flow Rate:    Intake/output summary:   Intake/Output Summary (Last 24 hours) at 07/26/16 1014  Last data filed at 07/25/16 2305  Gross per 24 hour  Intake              170 ml  Output                0 ml  Net              170 ml   LBM: Last BM Date: 07/25/16 Baseline Weight: Weight: 88.5 kg (195 lb) Most recent weight: Weight: 88.5 kg (195 lb)       Palliative Assessment/Data:    Flowsheet Rows   Flowsheet Row Most Recent Value  Intake Tab  Referral Department  Hospitalist  Unit at Time of Referral  Med/Surg Unit  Palliative Care Primary Diagnosis  Other (Comment) [Cirrhosis]  Date Notified  07/22/16  Palliative Care Type  New Palliative care  Date of Admission  07/21/16  Date first seen by Palliative Care  07/22/16  # of days Palliative referral response time  0 Day(s)  # of days IP prior to Palliative referral  1  Clinical Assessment  Palliative Performance Scale Score  30%  Pain Max last 24 hours  9  Pain Min Last 24 hours  0  Psychosocial & Spiritual Assessment  Palliative Care Outcomes  Patient/Family meeting held?  Yes  Who was at the meeting?  Patient, husband  Palliative Care Outcomes  Clarified goals of care      Patient Active Problem List   Diagnosis Date Noted  . Protein-calorie malnutrition, severe 07/24/2016  . Pressure injury of skin 07/21/2016  . Scoliosis 04/29/2016  . History of gout 04/29/2016  . Psoriasis 04/29/2016  . Folic acid deficiency 32/35/5732  . Obesity 04/24/2016  . Hypokalemia 04/19/2016  . Alcohol abuse 04/19/2016  . Abnormal liver function 04/19/2016  . Acute alcohol intoxication ([place]) 04/19/2016  . Alcoholic cirrhosis of liver with ascites ([place]) 04/19/2016    . Anemia of chronic disease 04/19/2016  . Hyperammonemia ([place]) 04/19/2016  . Troponin level elevated 04/19/2016  . Lactic acidosis 04/19/2016    Palliative Care Assessment & Plan   Patient Profile: 55 y.o. female  with past medical history of alcoholic liver disease, recurrent ascites, status post repeated paracentesis admitted on 07/21/2016 with weakness and encephalopathy.   Recommendations/Plan:  Her plan is to transition to SNF for rehab and to f/u with Dr. Collene Mares (GI).  I discussed with her husband on 12/20 and again on 12/23.  The concern that she has continued to progress in loss of nutrition and functional status.  We discussed that this may indicative of continued, irreversible decline.  They want to see how she does with rehab prior to making any further changes to care plan.  She is reaccumulating.  We discussed repeat paracentesis prior to discharge.  I believe she would benefit from repeat paracentesis prior to going to rehab (? maybe Tuesday).  Pain: Reports good relief with oxycodone and had been using 46m daily.  This increased over the last 24 hours as she reports that she realized medication is available every 4 hours.  She will likely benefit from long acting agent and will plan for addition of low dose (12.553m) fentanyl patch.  Based on her usage, I believe that she will likely need this increased to 2546m however, with her liver failure will err on side of lower dose to start and reevaluate to increase dose with next patch change.    Code Status:    Code Status Orders  Start     Ordered   07/21/16 1503  Do not attempt resuscitation (DNR)  Continuous    Question Answer Comment  In the event of cardiac or respiratory ARREST Do not call a "code blue"   In the event of cardiac or respiratory ARREST Do not perform Intubation, CPR, defibrillation or ACLS   In the event of cardiac or respiratory ARREST Use medication by any route, position, wound care, and other  measures to relive pain and suffering. May use oxygen, suction and manual treatment of airway obstruction as needed for comfort.      07/21/16 1502    Code Status History    Date Active Date Inactive Code Status Order ID Comments User Context   04/19/2016  3:29 AM 04/25/2016  3:30 PM Full Code [id number]  Jani Gravel, MD Inpatient       Prognosis:   Unable to determine  Discharge Planning:  [facility] for rehab with Palliative care service follow-up  Care plan was discussed with Dr. Bonner Puna  Thank you for allowing the Palliative Medicine Team to assist in the care of this patient.   Time In: 0950 Time Out: 1015 Total Time 25 Prolonged Time Billed No      Greater than 50%  of this time was spent counseling and coordinating care related to the above assessment and plan.  Micheline Rough, MD  Please contact Palliative Medicine Team phone at [phone] for questions and concerns.

## 2016-07-27 LAB — CBC
HCT: 25.7 % — ABNORMAL LOW (ref 36.0–46.0)
Hemoglobin: 8.9 g/dL — ABNORMAL LOW (ref 12.0–15.0)
MCH: 30 pg (ref 26.0–34.0)
MCHC: 34.6 g/dL (ref 30.0–36.0)
MCV: 86.5 fL (ref 78.0–100.0)
PLATELETS: 188 10*3/uL (ref 150–400)
RBC: 2.97 MIL/uL — AB (ref 3.87–5.11)
RDW: 14.9 % (ref 11.5–15.5)
WBC: 15.2 10*3/uL — AB (ref 4.0–10.5)

## 2016-07-27 LAB — BASIC METABOLIC PANEL
ANION GAP: 6 (ref 5–15)
BUN: 25 mg/dL — ABNORMAL HIGH (ref 6–20)
CALCIUM: 7.8 mg/dL — AB (ref 8.9–10.3)
CO2: 21 mmol/L — ABNORMAL LOW (ref 22–32)
Chloride: 102 mmol/L (ref 101–111)
Creatinine, Ser: 0.91 mg/dL (ref 0.44–1.00)
GFR calc Af Amer: >60 mL/min (ref >60)
Glucose, Bld: 105 mg/dL — ABNORMAL HIGH (ref 65–99)
POTASSIUM: 4.9 mmol/L (ref 3.5–5.1)
SODIUM: 129 mmol/L — AB (ref 135–145)

## 2016-07-27 LAB — CULTURE, BODY FLUID-BOTTLE: CULTURE: NO GROWTH

## 2016-07-27 MED ORDER — HYDROXYZINE HCL 10 MG PO TABS: 10.0000 mg | ORAL_TABLET | Freq: Two times a day (BID) | ORAL | Status: DC | PRN

## 2016-07-27 MED ORDER — ALPRAZOLAM 0.5 MG PO TABS
0.5000 mg | ORAL_TABLET | Freq: Three times a day (TID) | ORAL | Status: DC | PRN
  Administered 2016-07-27 – 2016-07-29 (×2): 0.5 mg via ORAL
  Filled 2016-07-27 (×3): qty 1

## 2016-07-27 MED ORDER — ALPRAZOLAM 0.5 MG PO TABS
0.5000 mg | ORAL_TABLET | Freq: Once | ORAL | Status: AC
Start: 2016-07-27 — End: 2016-07-27
  Administered 2016-07-27: 0.5 mg via ORAL
  Filled 2016-07-27: qty 1

## 2016-07-27 NOTE — Progress Notes (Signed)
Patient ID: Christine [name], female   DOB: July 19, 1961, 55 y.o.   MRN: [id number]  PROGRESS NOTE  GETHSEMANE FISCHLER  JXB:[id number] DOB: [birth year] DOA: 07/21/2016  PCP: [name], MD  Brief Narrative:  55 y.o. female with medical history significant of alcoholic liver disease with reoccurring ascites and repeated paracenteses who presented to [facility] with acute mental status changes over past few days prior to this admission.  CXR on admission showed possible pneumonia. Ammonia level was in 100 range. Concern was for SBP so pt started on empiric rocephin. She had paracentesis with 5 L fluids drained. Leukocytosis failed to resolve, so flagyl was added.   Assessment & Plan: Alcoholic cirrhosis of liver with ascites: s/p paracentesis 11/29 (7L) and 12/19 (5L, SAAG ~3 consistent with cirrhosis). Hyponatremia stable and no thrombocytopenia. AST/ALT 54/20 in toxicity/EtOH pattern. Synthetic function impaired with INR 1.68, albumin 1.6.  - Distention slowly worsening, no dyspnea. - Suspect repeat therapeutic thoracentesis will be required in next 3-4 days.  - Added anaerobic coverage for SBP (flagyl 500mg  IV q8h 12/23 > 12/27) since leukocytosis has not improved. Had non-diagnostic WBCs (< 250 PMNs/mm3) on para. - Considered albumin given renal failure, elevated BUN, but Cr improved with IV fluids alone.  - Plan to follow up with Dr. [name] as outpatient - Appreciate palliative care team addressing potential irreversible decompensation and pain management.  - Discussed with Dr. Neale Burly: Started fentanyl 12.79mcg patch 12/23. plan to titrate based on po oxycodone use.   Generalized weakness - PT eval - recommendation for SNF, CSW attempting placement, but insurance offices closed through 12/25.  - OOB to chair - Continue PT  Anemia of chronic disease: Due to bone marrow suppression form alcohol use, at baseline. Stable. - Continue to monitor daily CBC  Left lower lobe pneumonia - Ceftriaxone 12/18 > 12/24    - Blood cx: NGTD, sputum culture not obtained - Incentive spirometry due to risk of hypoinflation with ascites  Acute hepatic encephalopathy: Largely resolved since admission.   - Continue lactulose  - Ammonia level 113 on admission > 60 on 12/21  LE bilateral skin injury related to venous insufficiency:  - Appreciate WOC assessment: Cleansing twice daily with a pH balanced skin cleanser and gently patting dry prior to applying a thin layer of Eucerin cream. Gently wrapping from toe to knee with a roll gauze dressing, then topping it with light compression (13-16 mmHg) using an ACE bandage. Elevation via Prevalon Boots and pillows will continue therapy.  Acute kidney injury: Improving, anticipate ongoing improvement to premorbid baseline ( SCr < 0.30) with IV fluids for dehydration/sepsis. Possibly hepatorenal syndrome.  - Monitor off IV fluids  Hypokalemia - Supplemented - Follow up BMP  Severe protein calorie malnutrition: Also a cause of hypoalbuminemia along with cirrhosis.  - Diet as tolerated   DVT prophylaxis: SCD's bilaterally  Code Status: DNR/DNI Family Communication: Husband at bedside Disposition Plan: SNF pending bed availability, anticipate 12/26.  Consultants:   IR  Palliative care   SW  PT  Nutrition   Procedures:  Paracentesis 12/19 - 5 L fluid removed   Antimicrobials:   Vanco and cefepime given in ED  Rocephin 07/21/2016 -->  Flagyl 12/23 > 12/27   Subjective: Abdomen slightly larger, uncomfortable. No fevers, feels generally weak. No chest pain or dyspnea.   Objective: Vitals:   07/26/16 0512 07/26/16 1507 07/26/16 2111 07/27/16 0513  BP: (!) 111/54 117/67 (!) 121/56 (!) 115/53  Pulse: (!) 104 98 (!) 102 (!) 101  Resp: 18 18 18 18   Temp: 98.7 F (37.1 C) 98.1 F (36.7 C) 98.7 F (37.1 C) 98.4 F (36.9 C)  TempSrc: Oral Oral Oral Oral  SpO2: 99% 98% 99% 100%  Weight:      Height:        Intake/Output Summary (Last 24 hours) at  07/27/16 1117 Last data filed at 07/27/16 [id number]  Gross per 24 hour  Intake              650 ml  Output                0 ml  Net              650 ml   Filed Weights   07/21/16 1317  Weight: 88.5 kg (195 lb)    Examination:  General exam: No acute distress  Respiratory system: Respiratory effort normal. No wheezing, no rhonchi  Cardiovascular system: S1 & S2 heard, Rate controlled  Gastrointestinal system: (+) BS, distended without tenderness or rebound. Dull to percussion.  Central nervous system: Alert, oriented, nonfocal  Extremities: Palpable pulses. LE edema +1 with skin peeling  Skin: Bilateral LE excoriations, redness and +1 LE pitting edema, boots in place. Diffuse dry skin. Psychiatry: Normal mood and behavior   Data Reviewed: I have personally reviewed following labs and imaging studies  CBC:  Recent Labs Lab 07/21/16 1129 07/24/16 0518 07/25/16 0428 07/26/16 0606 07/27/16 0547  WBC 12.5* 11.0* 14.2* 15.1* 15.2*  NEUTROABS 8.2*  --   --   --   --   HGB 8.8* 8.5* 8.9* 8.6* 8.9*  HCT 24.2* 24.3* 25.4* 25.7* 25.7*  MCV 84.6 85.0 86.7 86.0 86.5  PLT 218 175 181 188 188   Basic Metabolic Panel:  Recent Labs Lab 07/22/16 0528 07/24/16 0518 07/25/16 0428 07/26/16 0606 07/27/16 0547  NA 132* 132* 130* 130* 129*  K 4.6 3.3* 4.5 4.5 4.9  CL 102 103 104 103 102  CO2 20* 20* 19* 22 21*  GLUCOSE 77 103* 107* 90 105*  BUN 32* 27* 24* 24* 25*  CREATININE 1.41* 1.09* 0.94 0.89 0.91  CALCIUM 7.9* 7.5* 7.6* 7.7* 7.8*   GFR: Estimated Creatinine Clearance: 74.3 mL/min (by C-G formula based on SCr of 0.91 mg/dL). Liver Function Tests:  Recent Labs Lab 07/21/16 1129 07/22/16 0528  AST 59* 54*  ALT 24 20  ALKPHOS 155* 131*  BILITOT 2.8* 2.6*  PROT 6.6 6.0*  ALBUMIN 1.8* 1.6*    Recent Labs Lab 07/21/16 1129  LIPASE 24    Recent Labs Lab 07/21/16 1129 07/22/16 0528 07/24/16 0840  AMMONIA 115* 113* 60*   Coagulation Profile:  Recent Labs Lab  07/21/16 1129 07/22/16 0621  INR 1.68 1.68   Cardiac Enzymes: No results for input(s): CKTOTAL, CKMB, CKMBINDEX, TROPONINI in the last 168 hours. BNP (last 3 results) No results for input(s): PROBNP in the last 8760 hours. HbA1C: No results for input(s): HGBA1C in the last 72 hours. CBG: No results for input(s): GLUCAP in the last 168 hours. Lipid Profile: No results for input(s): CHOL, HDL, LDLCALC, TRIG, CHOLHDL, LDLDIRECT in the last 72 hours. Thyroid Function Tests: No results for input(s): TSH, T4TOTAL, FREET4, T3FREE, THYROIDAB in the last 72 hours. Anemia Panel: No results for input(s): VITAMINB12, FOLATE, FERRITIN, TIBC, IRON, RETICCTPCT in the last 72 hours. Urine analysis:    Component Value Date/Time   COLORURINE YELLOW 07/21/2016 1120   APPEARANCEUR HAZY (A) 07/21/2016 1120   LABSPEC 1.009 07/21/2016 1120  PHURINE 6.0 07/21/2016 1120   GLUCOSEU NEGATIVE 07/21/2016 1120   HGBUR SMALL (A) 07/21/2016 1120   BILIRUBINUR NEGATIVE 07/21/2016 1120   BILIRUBINUR small 12/27/2011 1031   KETONESUR NEGATIVE 07/21/2016 1120   PROTEINUR NEGATIVE 07/21/2016 1120   UROBILINOGEN 0.2 12/27/2011 1031   NITRITE NEGATIVE 07/21/2016 1120   LEUKOCYTESUR LARGE (A) 07/21/2016 1120   Sepsis Labs: @LABRCNTIP (procalcitonin:4,lacticidven:4)   ) Recent Results (from the past 240 hour(s))  Blood culture (routine x 2)     Status: None   Collection Time: 07/21/16 12:50 PM  Result Value Ref Range Status   Specimen Description BLOOD LEFT HAND  Final   Special Requests IN PEDIATRIC BOTTLE 5CC  Final   Culture   Final    NO GROWTH 5 DAYS Performed [facility]    Report Status 07/26/2016 FINAL  Final  Blood culture (routine x 2)     Status: None   Collection Time: 07/21/16 12:50 PM  Result Value Ref Range Status   Specimen Description BLOOD BLOOD RIGHT FOREARM  Final   Special Requests BOTTLES DRAWN AEROBIC AND ANAEROBIC 5CC  Final   Culture   Final    NO GROWTH 5  DAYS Performed at [facility]    Report Status 07/26/2016 FINAL  Final  Culture, body fluid-bottle     Status: None   Collection Time: 07/22/16 12:17 PM  Result Value Ref Range Status   Specimen Description FLUID PERITONEAL  Final   Special Requests NONE  Final   Culture   Final    NO GROWTH 5 DAYS Performed at [facility]    Report Status 07/27/2016 FINAL  Final  Gram stain     Status: None   Collection Time: 07/22/16 12:17 PM  Result Value Ref Range Status   Specimen Description PERITONEAL  Final   Special Requests NONE  Final   Gram Stain   Final    FEW WBC PRESENT, PREDOMINANTLY MONONUCLEAR NO ORGANISMS SEEN Performed at [facility]     Report Status 07/22/2016 FINAL  Final      Radiology Studies: Dg Chest 2 View Result Date: 07/21/2016 Left lower lobe airspace consolidation. Lungs elsewhere clear. Cardiac silhouette within normal limits. Electronically Signed   By: Bretta BangWilliam  Woodruff III M.D.   On: 07/21/2016 11:56    Scheduled Meds: . cefTRIAXone (ROCEPHIN)  IV  2 g Intravenous Q24H  . chlorhexidine  15 mL Mouth Rinse BID  . feeding supplement (ENSURE ENLIVE)  237 mL Oral BID BM  . fentaNYL  12.5 mcg Transdermal Q72H  . hydrocerin   Topical BID  . lactulose  30 g Oral BID  . mouth rinse  15 mL Mouth Rinse q12n4p  . metroNIDAZOLE  500 mg Oral Q8H  . multivitamin with minerals  1 tablet Oral Daily  . pantoprazole  40 mg Oral Daily  . sodium chloride flush  3 mL Intravenous Q12H  . thiamine  100 mg Oral Daily   Continuous Infusions:   LOS: 6 days   Time spent: 25 minutes  Greater than 50% of the time spent on counseling and coordinating the care.  Hazeline Junkeryan Analya Louissaint, MD [facility] Pager [phone]  If 7PM-7AM, please contact night-coverage [email or web address] Password [email or web address] 07/27/2016, 11:17 AM

## 2016-07-28 LAB — CBC
HEMATOCRIT: 23.6 % — AB (ref 36.0–46.0)
HEMOGLOBIN: 8.2 g/dL — AB (ref 12.0–15.0)
MCH: 30 pg (ref 26.0–34.0)
MCHC: 34.7 g/dL (ref 30.0–36.0)
MCV: 86.4 fL (ref 78.0–100.0)
Platelets: 185 10*3/uL (ref 150–400)
RBC: 2.73 MIL/uL — AB (ref 3.87–5.11)
RDW: 15 % (ref 11.5–15.5)
WBC: 12.7 10*3/uL — AB (ref 4.0–10.5)

## 2016-07-28 LAB — BASIC METABOLIC PANEL
ANION GAP: 4 — AB (ref 5–15)
BUN: 23 mg/dL — ABNORMAL HIGH (ref 6–20)
CO2: 20 mmol/L — ABNORMAL LOW (ref 22–32)
Calcium: 7.8 mg/dL — ABNORMAL LOW (ref 8.9–10.3)
Chloride: 107 mmol/L (ref 101–111)
Creatinine, Ser: 0.78 mg/dL (ref 0.44–1.00)
GLUCOSE: 85 mg/dL (ref 65–99)
POTASSIUM: 5.3 mmol/L — AB (ref 3.5–5.1)
Sodium: 131 mmol/L — ABNORMAL LOW (ref 135–145)

## 2016-07-28 MED ORDER — SODIUM POLYSTYRENE SULFONATE 15 GM/60ML PO SUSP
30.0000 g | Freq: Once | ORAL | Status: AC
  Administered 2016-07-28: 30 g via ORAL
  Filled 2016-07-28: qty 120

## 2016-07-28 MED ORDER — LACTULOSE 10 GM/15ML PO SOLN
30.0000 g | Freq: Every day | ORAL | Status: DC
  Administered 2016-07-29 – 2016-07-30 (×2): 30 g via ORAL
  Filled 2016-07-28 (×2): qty 45

## 2016-07-28 NOTE — Progress Notes (Signed)
Patient refusing dressing change to lower extremities.  Pt states her legs hurt too much and she doesn't feel like they need to be changed.

## 2016-07-28 NOTE — Progress Notes (Signed)
Patient ID: Christine NamJanet M Pun, female   DOB: [birth year], 55 y.o.   MRN: [id number]  PROGRESS NOTE  Christine [name]  YQM:[id number] DOB: [birth year] DOA: 07/21/2016  PCP: Lonia BloodGARBA,LAWAL, MD  Brief Narrative:  55 y.o. female with medical history significant of alcoholic liver disease with reoccurring ascites and repeated paracenteses who [facility] with acute mental status changes over past few days prior to this admission.  CXR on admission showed possible pneumonia. Ammonia level was in 100 range. Concern was for SBP so pt started on empiric rocephin. She had paracentesis with 5 L fluids drained. Leukocytosis failed to resolve, so flagyl was added.   Assessment & Plan: Alcoholic cirrhosis of liver with ascites: s/p paracentesis 11/29 (7L) and 12/19 (5L, SAAG ~3 consistent with cirrhosis). Hyponatremia stable and no thrombocytopenia. AST/ALT 54/20 in toxicity/EtOH pattern. Synthetic function impaired with INR 1.68, albumin 1.6.  - Distention slowly worsening, no dyspnea > ordered repeat therapeutic thoracentesis  - Added anaerobic coverage for SBP (flagyl 500mg  IV q8h 12/23 > 12/27) since leukocytosis had not improved. Had non-diagnostic WBCs (< 250 PMNs/mm3) on para. - Considered albumin given renal failure, elevated BUN, but Cr improved with IV fluids alone.  - Plan to follow up with Dr. Loreta AveMann as outpatient - Appreciate palliative care team addressing potential irreversible decompensation and pain management: Started fentanyl 12.375mcg patch 12/23. Plan to titrate based on po oxycodone use.   Generalized weakness - PT eval - recommendation for SNF, CSW attempting placement, but insurance offices closed through 12/25.  - OOB to chair - Continue PT  Anemia of chronic disease: Due to bone marrow suppression form alcohol use, at baseline. Stable. - Continue to monitor daily CBC  Left lower lobe pneumonia - Ceftriaxone 12/18 > 12/24  - Blood cx: NGTD, sputum culture not obtained - Incentive  spirometry due to risk of hypoinflation with ascites  Acute hepatic encephalopathy: Largely resolved since admission.   - Continue lactulose  - Ammonia level 113 on admission > 60 on 12/21  LE bilateral skin injury related to venous insufficiency:  - Appreciate WOC assessment: Cleansing twice daily with a pH balanced skin cleanser and gently patting dry prior to applying a thin layer of Eucerin cream. Gently wrapping from toe to knee with a roll gauze dressing, then topping it with light compression (13-16 mmHg) using an ACE bandage. Elevation via Prevalon Boots and pillows will continue therapy.  Acute kidney injury: Improving, anticipate ongoing improvement to premorbid baseline ( SCr < 0.30) with IV fluids for dehydration/sepsis. Possibly hepatorenal syndrome.  - Monitor off IV fluids  Hyperkalemia: Given kayexalate this AM, will recheck in AM. Mild.  - Follow up BMP  Severe protein calorie malnutrition: Also a cause of hypoalbuminemia along with cirrhosis.  - Diet as tolerated   DVT prophylaxis: SCD's bilaterally  Code Status: DNR/DNI Family Communication: Husband at bedside Disposition Plan: SNF pending bed availability, anticipate 12/26 following paracentesis.  Consultants:   IR  Palliative care   SW  PT  Nutrition   Procedures:  Paracentesis 12/19 - 5 L fluid removed   Antimicrobials:   Vanco and cefepime given in ED  Rocephin 07/21/2016 -->  Flagyl 12/23 > 12/27   Subjective: Abdomen slightly larger, uncomfortable. No fevers, feels generally weak. No chest pain or dyspnea. Stable from yesterday.   Objective: Vitals:   07/27/16 0513 07/27/16 1529 07/27/16 2127 07/28/16 0601  BP: (!) 115/53 (!) 110/44 (!) 111/59 112/62  Pulse: (!) 101 (!) 103 (!) 106 (!) 101  Resp: 18 20 18 20   Temp: 98.4 F (36.9 C) 98.3 F (36.8 C) 99.1 F (37.3 C) 98.5 F (36.9 C)  TempSrc: Oral Oral Oral Oral  SpO2: 100% 97% 97% 99%  Weight:      Height:         Intake/Output Summary (Last 24 hours) at 07/28/16 1236 Last data filed at 07/27/16 2351  Gross per 24 hour  Intake              530 ml  Output                0 ml  Net              530 ml   Filed Weights   07/21/16 1317  Weight: 88.5 kg (195 lb)    Examination:  General exam: No acute distress  Respiratory system: Respiratory effort normal. No wheezing, no rhonchi  Cardiovascular system: S1 & S2 heard, Rate controlled  Gastrointestinal system: (+) BS, distended without tenderness or rebound. Dull to percussion.  Central nervous system: Alert, oriented, nonfocal  Extremities: Palpable pulses. LE edema +1 with skin peeling  Skin: Bilateral LE excoriations, redness and +1 LE pitting edema, boots in place. Diffuse dry skin. Psychiatry: Normal mood and behavior   Data Reviewed: I have personally reviewed following labs and imaging studies  CBC:  Recent Labs Lab 07/24/16 0518 07/25/16 0428 07/26/16 0606 07/27/16 0547 07/28/16 0517  WBC 11.0* 14.2* 15.1* 15.2* 12.7*  HGB 8.5* 8.9* 8.6* 8.9* 8.2*  HCT 24.3* 25.4* 25.7* 25.7* 23.6*  MCV 85.0 86.7 86.0 86.5 86.4  PLT 175 181 188 188 185   Basic Metabolic Panel:  Recent Labs Lab 07/24/16 0518 07/25/16 0428 07/26/16 0606 07/27/16 0547 07/28/16 0517  NA 132* 130* 130* 129* 131*  K 3.3* 4.5 4.5 4.9 5.3*  CL 103 104 103 102 107  CO2 20* 19* 22 21* 20*  GLUCOSE 103* 107* 90 105* 85  BUN 27* 24* 24* 25* 23*  CREATININE 1.09* 0.94 0.89 0.91 0.78  CALCIUM 7.5* 7.6* 7.7* 7.8* 7.8*   GFR: Estimated Creatinine Clearance: 84.5 mL/min (by C-G formula based on SCr of 0.78 mg/dL). Liver Function Tests:  Recent Labs Lab 07/22/16 0528  AST 54*  ALT 20  ALKPHOS 131*  BILITOT 2.6*  PROT 6.0*  ALBUMIN 1.6*   No results for input(s): LIPASE, AMYLASE in the last 168 hours.  Recent Labs Lab 07/22/16 0528 07/24/16 0840  AMMONIA 113* 60*   Coagulation Profile:  Recent Labs Lab 07/22/16 0621  INR 1.68   Cardiac  Enzymes: No results for input(s): CKTOTAL, CKMB, CKMBINDEX, TROPONINI in the last 168 hours. BNP (last 3 results) No results for input(s): PROBNP in the last 8760 hours. HbA1C: No results for input(s): HGBA1C in the last 72 hours. CBG: No results for input(s): GLUCAP in the last 168 hours. Lipid Profile: No results for input(s): CHOL, HDL, LDLCALC, TRIG, CHOLHDL, LDLDIRECT in the last 72 hours. Thyroid Function Tests: No results for input(s): TSH, T4TOTAL, FREET4, T3FREE, THYROIDAB in the last 72 hours. Anemia Panel: No results for input(s): VITAMINB12, FOLATE, FERRITIN, TIBC, IRON, RETICCTPCT in the last 72 hours. Urine analysis:    Component Value Date/Time   COLORURINE YELLOW 07/21/2016 1120   APPEARANCEUR HAZY (A) 07/21/2016 1120   LABSPEC 1.009 07/21/2016 1120   PHURINE 6.0 07/21/2016 1120   GLUCOSEU NEGATIVE 07/21/2016 1120   HGBUR SMALL (A) 07/21/2016 1120   BILIRUBINUR NEGATIVE 07/21/2016 1120   BILIRUBINUR small  12/27/2011 1031   KETONESUR NEGATIVE 07/21/2016 1120   PROTEINUR NEGATIVE 07/21/2016 1120   UROBILINOGEN 0.2 12/27/2011 1031   NITRITE NEGATIVE 07/21/2016 1120   LEUKOCYTESUR LARGE (A) 07/21/2016 1120   Sepsis Labs: @LABRCNTIP (procalcitonin:4,lacticidven:4)   ) Recent Results (from the past 240 hour(s))  Blood culture (routine x 2)     Status: None   Collection Time: 07/21/16 12:50 PM  Result Value Ref Range Status   Specimen Description BLOOD LEFT HAND  Final   Special Requests IN PEDIATRIC BOTTLE 5CC  Final   Culture   Final    NO GROWTH 5 DAYS Performed at [facility]    Report Status 07/26/2016 FINAL  Final  Blood culture (routine x 2)     Status: None   Collection Time: 07/21/16 12:50 PM  Result Value Ref Range Status   Specimen Description BLOOD BLOOD RIGHT FOREARM  Final   Special Requests BOTTLES DRAWN AEROBIC AND ANAEROBIC 5CC  Final   Culture   Final    NO GROWTH 5 DAYS Performed at [facility]    Report Status  07/26/2016 FINAL  Final  Culture, body fluid-bottle     Status: None   Collection Time: 07/22/16 12:17 PM  Result Value Ref Range Status   Specimen Description FLUID PERITONEAL  Final   Special Requests NONE  Final   Culture   Final    NO GROWTH 5 DAYS Performed at [facility]    Report Status 07/27/2016 FINAL  Final  Gram stain     Status: None   Collection Time: 07/22/16 12:17 PM  Result Value Ref Range Status   Specimen Description PERITONEAL  Final   Special Requests NONE  Final   Gram Stain   Final    FEW WBC PRESENT, PREDOMINANTLY MONONUCLEAR NO ORGANISMS SEEN Performed at [facility]    Report Status 07/22/2016 FINAL  Final      Radiology Studies: Dg Chest 2 View Result Date: 07/21/2016 Left lower lobe airspace consolidation. Lungs elsewhere clear. Cardiac silhouette within normal limits. Electronically Signed   By: Bretta BangWilliam  Woodruff III M.D.   On: 07/21/2016 11:56    Scheduled Meds: . cefTRIAXone (ROCEPHIN)  IV  2 g Intravenous Q24H  . chlorhexidine  15 mL Mouth Rinse BID  . feeding supplement (ENSURE ENLIVE)  237 mL Oral BID BM  . fentaNYL  12.5 mcg Transdermal Q72H  . hydrocerin   Topical BID  . lactulose  30 g Oral BID  . mouth rinse  15 mL Mouth Rinse q12n4p  . metroNIDAZOLE  500 mg Oral Q8H  . multivitamin with minerals  1 tablet Oral Daily  . pantoprazole  40 mg Oral Daily  . sodium chloride flush  3 mL Intravenous Q12H  . thiamine  100 mg Oral Daily   Continuous Infusions:   LOS: 7 days   Time spent: 15 minutes  Greater than 50% of the time spent on counseling and coordinating the care.  Hazeline Junkeryan Amyjo Mizrachi, MD [facility] Pager [phone]  If 7PM-7AM, please contact night-coverage [email or web address] Password [email or web address] 07/28/2016, 12:36 PM

## 2016-07-29 ENCOUNTER — Inpatient Hospital Stay (HOSPITAL_COMMUNITY): Payer: BLUE CROSS/BLUE SHIELD

## 2016-07-29 LAB — BASIC METABOLIC PANEL
Anion gap: 7 (ref 5–15)
BUN: 21 mg/dL — AB (ref 6–20)
CALCIUM: 7.3 mg/dL — AB (ref 8.9–10.3)
CHLORIDE: 101 mmol/L (ref 101–111)
CO2: 19 mmol/L — AB (ref 22–32)
CREATININE: 0.72 mg/dL (ref 0.44–1.00)
GFR calc Af Amer: >60 mL/min (ref >60)
GFR calc non Af Amer: >60 mL/min (ref >60)
GLUCOSE: 88 mg/dL (ref 65–99)
Potassium: 3.9 mmol/L (ref 3.5–5.1)
Sodium: 127 mmol/L — ABNORMAL LOW (ref 135–145)

## 2016-07-29 LAB — CBC
HCT: 22.7 % — ABNORMAL LOW (ref 36.0–46.0)
Hemoglobin: 8 g/dL — ABNORMAL LOW (ref 12.0–15.0)
MCH: 30.5 pg (ref 26.0–34.0)
MCHC: 35.2 g/dL (ref 30.0–36.0)
MCV: 86.6 fL (ref 78.0–100.0)
PLATELETS: 187 10*3/uL (ref 150–400)
RBC: 2.62 MIL/uL — ABNORMAL LOW (ref 3.87–5.11)
RDW: 15.1 % (ref 11.5–15.5)
WBC: 11.3 10*3/uL — ABNORMAL HIGH (ref 4.0–10.5)

## 2016-07-29 MED ORDER — FENTANYL 25 MCG/HR TD PT72: 25.0000 ug | MEDICATED_PATCH | TRANSDERMAL | Status: DC

## 2016-07-29 NOTE — Procedures (Signed)
Successful US guided paracentesis from left lateral abdomen.  Yielded 9.4 L of dark, amber, blood-tinged fluid.  No immediate complications.  Pt tolerated well.   Specimen was not sent for labs.  Hoyt KochKacie Sue-Ellen Matthews PA-C 07/29/2016 12:20 PM

## 2016-07-29 NOTE — Progress Notes (Addendum)
Patient ID: Christine NamJanet M [name], female   DOB: 1960-10-21, 55 y.o.   MRN: [id number]  PROGRESS NOTE  Christine [name]  [id number] DOB: 1960-10-21 DOA: 07/21/2016  PCP: Lonia BloodGARBA,LAWAL, MD  Brief Narrative:  55 y.o. female with medical history significant of alcoholic liver disease with recurring ascites and repeated paracenteses who [facility] with acute mental status changes over past few days prior to this admission. CXR on admission showed possible pneumonia. Ammonia level was in 100 range. Ascites was present and paracentesis was performed with with 5 L transudative fluid drained. Leukocytosis failed to resolve, so flagyl was added with improvement. Palliative care was consulted to establish goals of care and pain control recommendations. The patient has been stable for discharge since 12/23 but insurance approval was not obtainable due to the weekend and holidays. On 12/26 she was scheduled for repeat paracentesis prior to discharge to SNF. Insurance requested repeat physical therapy evaluation prior to discharge.   Assessment & Plan: Alcoholic cirrhosis of liver with ascites: s/p paracentesis 11/29 (7L) and 12/19 (5L, SAAG ~3 consistent with cirrhosis). Hyponatremia stable and no thrombocytopenia. AST/ALT 54/20 in toxicity/EtOH pattern. Synthetic function impaired with INR 1.68, albumin 1.6.  - Repeated therapeutic paracentesis 12/26.  - Added anaerobic coverage for SBP (flagyl 500mg  IV q8h 12/23 > 12/27) since leukocytosis had not improved. Had non-diagnostic WBCs (< 250 PMNs/mm3) on para centesis 12/19. - Considered albumin given renal failure, elevated BUN, but Cr improved with IV fluids alone.  - Ascites refractory to diuretics, so these were stopped due to renal impairment.  - Plan to follow up with Dr. Loreta AveMann as outpatient - Appreciate palliative care team addressing potential irreversible decompensation and pain management: Started fentanyl 12.755mcg patch 12/23 > 25mcg 12/26. Plan to titrate  based on po oxycodone use.    Generalized weakness - PT eval - recommendation for SNF, CSW attempting placement, but insurance offices closed through 12/25, and then required repeat PT evaluation which delayed discharge.   - OOB to chair - Continue PT at SNF.  Anemia of chronic disease: Due to bone marrow suppression form alcohol use, at baseline. Stable. - Monitor CBC post-para  Left lower lobe pneumonia - Ceftriaxone 12/18 >> 12/27  - Blood cx: NGTD, sputum culture not obtained - Incentive spirometry due to risk of hypoinflation with ascites  Acute hepatic encephalopathy: Largely resolved since admission.   - Continue lactulose  - Ammonia level 113 on admission > to 60 on 12/21  LE bilateral skin injury related to venous insufficiency:  - Appreciate WOC assessment: Cleansing twice daily with a pH balanced skin cleanser and gently patting dry prior to applying a thin layer of Eucerin cream. Gently wrapping from toe to knee with a roll gauze dressing, then topping it with light compression (13-16 mmHg) using an ACE bandage. Elevation via Prevalon Boots and pillows will continue therapy.  Acute kidney injury: Improving, anticipate ongoing improvement to premorbid baseline ( SCr < 0.30) with IV fluids for dehydration/sepsis. Possibly hepatorenal syndrome.  - Continues to improve off IVF and with holding diuretics.   Hyperkalemia: Given kayexalate this AM, will recheck in AM. Mild.  - Follow up BMP  Severe protein calorie malnutrition: Also a cause of hypoalbuminemia along with cirrhosis.  - Nutrition consulted - Diet as tolerated   DVT prophylaxis: SCD's bilaterally  Code Status: DNR/DNI Family Communication: Husband at bedside Disposition Plan: SNF pending bed availability. Will be 12/27.  Consultants:   IR  Palliative care   SW  PT  Nutrition   Procedures:  Paracentesis 12/19 - 5 L fluid removed   Paracentesis 12/26 - 9.4L removed  Antimicrobials:   Vanco and  cefepime given in ED  Rocephin 07/21/2016 -->  Flagyl 12/23 > 12/27  Subjective: Abdomen slightly larger, uncomfortable. No fevers, feels generally weak. No chest pain or dyspnea. Stable from yesterday.   Objective: Vitals:   07/29/16 1109 07/29/16 1115 07/29/16 1127 07/29/16 1307  BP: (!) 100/44 (!) 93/39 (!) 102/45 (!) 103/52  Pulse:   (!) 102 (!) 101  Resp:    18  Temp:   98.2 F (36.8 C) 99 F (37.2 C)  TempSrc:   Oral Oral  SpO2:   98% 98%  Weight:      Height:        Intake/Output Summary (Last 24 hours) at 07/29/16 1840 Last data filed at 07/28/16 2149  Gross per 24 hour  Intake                3 ml  Output                0 ml  Net                3 ml   Filed Weights   07/21/16 1317  Weight: 88.5 kg (195 lb)    Examination:  General exam: No acute distress  Respiratory system: Respiratory effort normal. No wheezing, no rhonchi  Cardiovascular system: S1 & S2 heard, Rate controlled  Gastrointestinal system: (+) BS, distended without tenderness or rebound. Dull to percussion.  Central nervous system: Alert, oriented, nonfocal  Extremities: Palpable pulses. LE edema +1 with skin peeling  Skin: Bilateral LE excoriations, redness and +1 LE pitting edema, boots in place. Diffuse dry skin. Psychiatry: Normal mood and behavior   Data Reviewed: I have personally reviewed following labs and imaging studies  CBC:  Recent Labs Lab 07/25/16 0428 07/26/16 0606 07/27/16 0547 07/28/16 0517 07/29/16 0457  WBC 14.2* 15.1* 15.2* 12.7* 11.3*  HGB 8.9* 8.6* 8.9* 8.2* 8.0*  HCT 25.4* 25.7* 25.7* 23.6* 22.7*  MCV 86.7 86.0 86.5 86.4 86.6  PLT 181 188 188 185 187   Basic Metabolic Panel:  Recent Labs Lab 07/25/16 0428 07/26/16 0606 07/27/16 0547 07/28/16 0517 07/29/16 0457  NA 130* 130* 129* 131* 127*  K 4.5 4.5 4.9 5.3* 3.9  CL 104 103 102 107 101  CO2 19* 22 21* 20* 19*  GLUCOSE 107* 90 105* 85 88  BUN 24* 24* 25* 23* 21*  CREATININE 0.94 0.89 0.91 0.78  0.72  CALCIUM 7.6* 7.7* 7.8* 7.8* 7.3*   GFR: Estimated Creatinine Clearance: 84.5 mL/min (by C-G formula based on SCr of 0.72 mg/dL). Liver Function Tests: No results for input(s): AST, ALT, ALKPHOS, BILITOT, PROT, ALBUMIN in the last 168 hours. No results for input(s): LIPASE, AMYLASE in the last 168 hours.  Recent Labs Lab 07/24/16 0840  AMMONIA 60*   Coagulation Profile: No results for input(s): INR, PROTIME in the last 168 hours. Cardiac Enzymes: No results for input(s): CKTOTAL, CKMB, CKMBINDEX, TROPONINI in the last 168 hours. BNP (last 3 results) No results for input(s): PROBNP in the last 8760 hours. HbA1C: No results for input(s): HGBA1C in the last 72 hours. CBG: No results for input(s): GLUCAP in the last 168 hours. Lipid Profile: No results for input(s): CHOL, HDL, LDLCALC, TRIG, CHOLHDL, LDLDIRECT in the last 72 hours. Thyroid Function Tests: No results for input(s): TSH, T4TOTAL, FREET4, T3FREE, THYROIDAB in the last 72  hours. Anemia Panel: No results for input(s): VITAMINB12, FOLATE, FERRITIN, TIBC, IRON, RETICCTPCT in the last 72 hours. Urine analysis:    Component Value Date/Time   COLORURINE YELLOW 07/21/2016 1120   APPEARANCEUR HAZY (A) 07/21/2016 1120   LABSPEC 1.009 07/21/2016 1120   PHURINE 6.0 07/21/2016 1120   GLUCOSEU NEGATIVE 07/21/2016 1120   HGBUR SMALL (A) 07/21/2016 1120   BILIRUBINUR NEGATIVE 07/21/2016 1120   BILIRUBINUR small 12/27/2011 1031   KETONESUR NEGATIVE 07/21/2016 1120   PROTEINUR NEGATIVE 07/21/2016 1120   UROBILINOGEN 0.2 12/27/2011 1031   NITRITE NEGATIVE 07/21/2016 1120   LEUKOCYTESUR LARGE (A) 07/21/2016 1120   Sepsis Labs: @LABRCNTIP (procalcitonin:4,lacticidven:4)   ) Recent Results (from the past 240 hour(s))  Blood culture (routine x 2)     Status: None   Collection Time: 07/21/16 12:50 PM  Result Value Ref Range Status   Specimen Description BLOOD LEFT HAND  Final   Special Requests IN PEDIATRIC BOTTLE 5CC   Final   Culture   Final    NO GROWTH 5 DAYS Performed at [facility]    Report Status 07/26/2016 FINAL  Final  Blood culture (routine x 2)     Status: None   Collection Time: 07/21/16 12:50 PM  Result Value Ref Range Status   Specimen Description BLOOD BLOOD RIGHT FOREARM  Final   Special Requests BOTTLES DRAWN AEROBIC AND ANAEROBIC 5CC  Final   Culture   Final    NO GROWTH 5 DAYS Performed at [facility]    Report Status 07/26/2016 FINAL  Final  Culture, body fluid-bottle     Status: None   Collection Time: 07/22/16 12:17 PM  Result Value Ref Range Status   Specimen Description FLUID PERITONEAL  Final   Special Requests NONE  Final   Culture   Final    NO GROWTH 5 DAYS Performed at [facility]    Report Status 07/27/2016 FINAL  Final  Gram stain     Status: None   Collection Time: 07/22/16 12:17 PM  Result Value Ref Range Status   Specimen Description PERITONEAL  Final   Special Requests NONE  Final   Gram Stain   Final    FEW WBC PRESENT, PREDOMINANTLY MONONUCLEAR NO ORGANISMS SEEN Performed at [facility]    Report Status 07/22/2016 FINAL  Final      Radiology Studies: Dg Chest 2 View Result Date: 07/21/2016 Left lower lobe airspace consolidation. Lungs elsewhere clear. Cardiac silhouette within normal limits. Electronically Signed   By: Bretta Bang III M.D.   On: 07/21/2016 11:56    Scheduled Meds: . cefTRIAXone (ROCEPHIN)  IV  2 g Intravenous Q24H  . chlorhexidine  15 mL Mouth Rinse BID  . feeding supplement (ENSURE ENLIVE)  237 mL Oral BID BM  . [START ON 08/01/2016] fentaNYL  25 mcg Transdermal Q72H  . hydrocerin   Topical BID  . lactulose  30 g Oral Daily  . mouth rinse  15 mL Mouth Rinse q12n4p  . metroNIDAZOLE  500 mg Oral Q8H  . multivitamin with minerals  1 tablet Oral Daily  . pantoprazole  40 mg Oral Daily  . sodium chloride flush  3 mL Intravenous Q12H  . thiamine  100 mg Oral Daily   Continuous  Infusions:   LOS: 8 days   Time spent: 15 minutes  Greater than 50% of the time spent on counseling and coordinating the care.  Hazeline Junker, MD [facility] Pager [phone]  If 7PM-7AM,  please contact night-coverage [email or web address] Password [facility] 07/29/2016, 6:40 PM

## 2016-07-29 NOTE — Progress Notes (Signed)
Daily Progress Note   Patient Name: Joaquim NamJanet M Kazanjian       Date: 07/29/2016 DOB: [birth year]  Age: 55 y.o. MRN#: [id number] Attending Physician: Tyrone Nineyan B Grunz, MD Primary Care Physician: Lonia BloodGARBA,LAWAL, MD Admit Date: 07/21/2016  Reason for Consultation/Follow-up: Establishing goals of care  Subjective: Awake alert resting in bed, friend at bedside. Returned from paracentesis earlier this am.  Discussed with patient about her pain management. See below.    Length of Stay: 8  Current Medications: Scheduled Meds:  . cefTRIAXone (ROCEPHIN)  IV  2 g Intravenous Q24H  . chlorhexidine  15 mL Mouth Rinse BID  . feeding supplement (ENSURE ENLIVE)  237 mL Oral BID BM  . [START ON 08/01/2016] fentaNYL  25 mcg Transdermal Q72H  . hydrocerin   Topical BID  . lactulose  30 g Oral Daily  . mouth rinse  15 mL Mouth Rinse q12n4p  . metroNIDAZOLE  500 mg Oral Q8H  . multivitamin with minerals  1 tablet Oral Daily  . pantoprazole  40 mg Oral Daily  . sodium chloride flush  3 mL Intravenous Q12H  . thiamine  100 mg Oral Daily    Continuous Infusions:   PRN Meds: ALPRAZolam, diphenhydrAMINE, oxyCODONE  Physical Exam    General exam: No acute distress  Respiratory system: Respiratory effort normal. No wheezing.  Cardiovascular system: Regular rate and rhythm.  Gastrointestinal system: distended  Mildly Central nervous system: Nonfocal  Extremities: Palpable pulses. LE edema Skin: Bialteral LE excoriations, redness Psychiatry: Normal mood and behavior        Vital Signs: BP (!) 103/52 (BP Location: Left Arm)   Pulse (!) 101   Temp 99 F (37.2 C) (Oral)   Resp 18   Ht 5\' 4"  (1.626 m)   Wt 88.5 kg (195 lb)   SpO2 98%   BMI 33.47 kg/m  SpO2: SpO2: 98 % O2 Device: O2 Device: Not  Delivered O2 Flow Rate:    Intake/output summary:   Intake/Output Summary (Last 24 hours) at 07/29/16 1443 Last data filed at 07/28/16 2149  Gross per 24 hour  Intake                3 ml  Output                0 ml  Net                3 ml   LBM: Last BM Date: 07/28/16 Baseline Weight: Weight: 88.5 kg (195 lb) Most recent weight: Weight: 88.5 kg (195 lb)       Palliative Assessment/Data:    Flowsheet Rows   Flowsheet Row Most Recent Value  Intake Tab  Referral Department  Hospitalist  Unit at Time of Referral  Med/Surg Unit  Palliative Care Primary Diagnosis  Other (Comment) [Cirrhosis]  Date Notified  07/22/16  Palliative Care Type  New Palliative care  Date of Admission  07/21/16  Date first seen by Palliative Care  07/22/16  # of days Palliative referral response time  0 Day(s)  # of days IP prior to Palliative referral  1  Clinical Assessment  Palliative Performance Scale Score  30%  Pain Max last 24 hours  9  Pain Min Last 24 hours  0  Psychosocial & Spiritual Assessment  Palliative Care Outcomes  Patient/Family meeting held?  Yes  Who was at the meeting?  Patient, husband  Palliative Care Outcomes  Clarified goals of care      Patient Active Problem List   Diagnosis Date Noted  . Protein-calorie malnutrition, severe 07/24/2016  . Pressure injury of skin 07/21/2016  . Scoliosis 04/29/2016  . History of gout 04/29/2016  . Psoriasis 04/29/2016  . Folic acid deficiency 04/24/2016  . Obesity 04/24/2016  . Hypokalemia 04/19/2016  . Alcohol abuse 04/19/2016  . Abnormal liver function 04/19/2016  . Acute alcohol intoxication (HCC) 04/19/2016  . Alcoholic cirrhosis of liver with ascites (HCC) 04/19/2016  . Anemia of chronic disease 04/19/2016  . Hyperammonemia (HCC) 04/19/2016  . Troponin level elevated 04/19/2016  . Lactic acidosis 04/19/2016    Palliative Care Assessment & Plan   Patient Profile: 54 y.o. female  with past medical history of alcoholic  liver disease, recurrent ascites, status post repeated paracentesis admitted on 07/21/2016 with weakness and encephalopathy.   Recommendations/Plan:  Her plan is to transition to SNF for rehab and to f/u with Dr. [name] (GI).    Patient underwent paracentesis, will need to continue to monitor re accumulation.  Pain: patient has required 70 mg PO Oxy IR in the past 24 hours. It is appropriate to increase trans dermal fentanyl to 25 mcg, change Q 72 hours. Discussed with patient.   Code Status:    Code Status Orders        Start     Ordered   07/21/16 1503  Do not attempt resuscitation (DNR)  Continuous    Question Answer Comment  In the event of cardiac or respiratory ARREST Do not call a "code blue"   In the event of cardiac or respiratory ARREST Do not perform Intubation, CPR, defibrillation or ACLS   In the event of cardiac or respiratory ARREST Use medication by any route, position, wound care, and other measures to relive pain and suffering. May use oxygen, suction and manual treatment of airway obstruction as needed for comfort.      07/21/16 1502    Code Status History    Date Active Date Inactive Code Status Order ID Comments User Context   04/19/2016  3:29 AM 04/25/2016  3:30 PM Full Code [id number]  Pearson Grippe, MD Inpatient       Prognosis:   ? Less than 12 months.   Discharge Planning:  Skilled Nursing Facility for rehab with Palliative care service follow-up  Care plan was discussed with  patient.   Thank you for allowing the Palliative Medicine Team to assist in the care of this patient.   Time In: 1300 Time Out: 1325 Total Time 25 Prolonged Time Billed No      Greater than 50%  of this time was spent counseling and coordinating care related to the above assessment and plan.  Rosalin HawkingZeba Hallis Meditz, MD [phone]  Please contact Palliative Medicine Team phone at [phone] for questions and concerns.

## 2016-07-29 NOTE — Progress Notes (Signed)
PT Cancellation Note  Patient Details Name: Christine NamJanet M [name] MRN: [id number] DOB: June 17, 1961   Cancelled Treatment:     attempted to work with patient this morning, however patient was out of room and down in a procedure. Will check back this afternoon.    Marella BileBRITT, Epimenio Schetter 07/29/2016, 11:20 AM Marella BileSharron Darcus Edds, PT Pager: [phone] 07/29/2016

## 2016-07-29 NOTE — Progress Notes (Signed)
Physical Therapy Treatment Patient Details Name: Christine [name] MRN: [id number] DOB: 02/20/1961 Today's Date: 07/29/2016    History of Present Illness 55 y.o.femalewith medical history significant of alcoholic liver disease with reoccurring ascites and repeated paracenteses who [facility] with acute mental status changes over past few days prior to this admission.     PT Comments    Pt seemed to have a little more energy today with participation and excited to attempt to get up again. Noticed R foot with PF tightness limiting greater DF. Performed a lot of gentle stretches in bed and EOB wile PT sitting, still unable to get to neutral and pt has pain in this R foot. While standing pt unable to get foot with full contact on floor dur to limited DF making WB on RLE difficult. Worked with static standing and pivot transfers to [facility] with RW today. DF improved during session. Pt to still benefit from continued PT while here and recommend SNF for continued therapy as well.   Follow Up Recommendations  SNF     Equipment Recommendations       Recommendations for Other Services OT consult     Precautions / Restrictions Precautions Precautions: Fall    Mobility  Bed Mobility Overal bed mobility: Needs Assistance Bed Mobility: Rolling;Supine to Sit;Sit to Supine Rolling: Min assist   Supine to sit: Mod assist Sit to supine: +2 for physical assistance;Mod assist   General bed mobility comments: assist with R LE for movment in bed and assist with upper body and LES for supien to sit and sit to supine. Difficulty with flexion in trunk due to large ascites  Transfers Overall transfer level: Needs assistance Equipment used: Rolling walker (2 wheeled) Transfers: Sit to/from [employer] CorporationStand;Stand Pivot Transfers Sit to Stand: Mod assist;+2 physical assistance Stand pivot transfers: Mod assist;+2 physical assistance       General transfer comment: sit to stand improved from teh other day,  however only able to bear weight through LLE , due to R foot very painful and very tight in PF with limited DF  on R. Difficult with pivot with RW , howver was able to perform. Cues to utilize UE on RW to push down to pivot. Then practice sit to stand 3 times with static hold longest for 2 minutes .   Ambulation/Gait Ambulation/Gait assistance:  (unable to take a step with R foot painful and limited DF at this time. )               Stairs            Wheelchair Mobility    Modified Rankin (Stroke Patients Only)       Balance                                    Cognition Arousal/Alertness: Awake/alert Behavior During Therapy: WFL for tasks assessed/performed Overall Cognitive Status: Within Functional Limits for tasks assessed                      Exercises Other Exercises Other Exercises: performed DF gentle stretch while pt sitting EOB, was able to allow gravity to get to DF -20, however started around -40. Perfromed AAROM for PF /DF on R LE and AROM on LLE. R short kicks with AAROM 5xs, and LLE AROM short kicks 10 x. Placed prafo boot on B LE to assist with DF as  much as possible after session. Educated patient and husband on AROM for PF /DF as bed exercises daily.     General Comments        Pertinent Vitals/Pain Pain Score: 3  Pain Location: R foot with movement and touch  Pain Descriptors / Indicators: Aching    Home Living                      Prior Function            PT Goals (current goals can now be found in the care plan section) Acute Rehab PT Goals Patient Stated Goal: I want to be able to move a little again. I have not been able to lately Time For Goal Achievement: 08/06/16 Potential to Achieve Goals: Good Progress towards PT goals: Progressing toward goals    Frequency    Min 3X/week      PT Plan Current plan remains appropriate    Co-evaluation             End of Session Equipment Utilized  During Treatment: Gait belt Activity Tolerance: Patient tolerated treatment well Patient left: in bed;with call bell/phone within reach;with family/visitor present     Time: [id number] PT Time Calculation (min) (ACUTE ONLY): 48 min  Charges:  $Therapeutic Exercise: 8-22 mins $Therapeutic Activity: 23-37 mins                    G CodesMarella [name]:      Christine [name] 07/29/2016, 6:05 PM  Christine [name], PT Pager: [phone] 07/29/2016

## 2016-07-29 NOTE — Care Management Note (Signed)
Case Management Note  Patient Details  Name: Christine [name] MRN: [id number] Date of Birth: 1961/07/25  Subjective/Objective:                  Alcoholic cirrhosis of liver with ascites Action/Plan: Discharge planning Expected Discharge Date:   (unknown)               Expected Discharge Plan:  Skilled Nursing Facility  In-House Referral:     Discharge planning Services  CM Consult  Post Acute Care Choice:    Choice offered to:  NA  DME Arranged:  N/A DME Agency:  NA  HH Arranged:  NA HH Agency:  NA  Status of Service:  Completed, signed off  If discussed at Long Length of Stay Meetings, dates discussed:    Additional Comments: Cm notes plan is for pt to be discharged to SNF; CSW aware and arranging.  No other CM needs were communicated. Yves DillJeffries, Arretta Toenjes Christine, RN 07/29/2016, 12:12 PM

## 2016-07-29 NOTE — Clinical Social Work Placement (Signed)
CSW following [facility] when medically [facility]. [facility], awaiting updated PT note .    Lincoln MaxinKelly Darionna Banke, LCSW [facility] Clinical Social Worker cell #: [phone]    CLINICAL SOCIAL WORK PLACEMENT  NOTE  Date:  07/29/2016  Patient Details  Name: Christine NamJanet M Hartsough MRN: [id number] Date of Birth: Mar 11, 1961  Clinical Social Work is seeking post-discharge placement for this patient at the Skilled  Nursing Facility level of care (*CSW will initial, date and re-position this form in  chart as items are completed):  Yes   Patient/family provided with [facility] Clinical Social Work Department's list of facilities offering this level of care within the geographic area requested by the patient (or if unable, by the patient's family).  Yes   Patient/family informed of their freedom to choose among providers that offer the needed level of care, that participate in Medicare, Medicaid or managed care program needed by the patient, have an available bed and are willing to accept the patient.  Yes   Patient/family informed of [facility] [facility]'s ownership interest in [facility] and [facility], as well as of the fact that they are under no obligation to receive care at these facilities.  PASRR submitted to EDS on       PASRR number received on       Existing PASRR number confirmed on 07/24/16     FL2 transmitted to all facilities in geographic area requested by pt/family on 07/24/16     FL2 transmitted to all facilities within larger geographic area on       Patient informed that his/her managed care company has contracts with or will negotiate with certain facilities, including the following:        Yes   Patient/family informed of bed offers received.  [facility]     Physician recommends and patient chooses bed at      [facility]  .  Patient to be transferred to facility by       Patient family notified on   of transfer.  Name of family member notified:        PHYSICIAN       Additional Comment:    _______________________________________________ Arlyss RepressHarrison, Ryun Velez F, LCSW 07/29/2016, 3:08 PM

## 2016-07-30 DIAGNOSIS — L89159 Pressure ulcer of sacral region, unspecified stage: Secondary | ICD-10-CM

## 2016-07-30 DIAGNOSIS — E43 Unspecified severe protein-calorie malnutrition: Secondary | ICD-10-CM

## 2016-07-30 DIAGNOSIS — R531 Weakness: Secondary | ICD-10-CM

## 2016-07-30 DIAGNOSIS — K7031 Alcoholic cirrhosis of liver with ascites: Secondary | ICD-10-CM

## 2016-07-30 DIAGNOSIS — E7220 Disorder of urea cycle metabolism, unspecified: Secondary | ICD-10-CM

## 2016-07-30 DIAGNOSIS — E876 Hypokalemia: Secondary | ICD-10-CM

## 2016-07-30 DIAGNOSIS — D638 Anemia in other chronic diseases classified elsewhere: Secondary | ICD-10-CM

## 2016-07-30 DIAGNOSIS — R140 Abdominal distension (gaseous): Secondary | ICD-10-CM

## 2016-07-30 LAB — COMPREHENSIVE METABOLIC PANEL
ALK PHOS: 121 U/L (ref 38–126)
ALT: 18 U/L (ref 14–54)
ANION GAP: 6 (ref 5–15)
AST: 34 U/L (ref 15–41)
Albumin: 1.6 g/dL — ABNORMAL LOW (ref 3.5–5.0)
BUN: 22 mg/dL — ABNORMAL HIGH (ref 6–20)
CALCIUM: 7.3 mg/dL — AB (ref 8.9–10.3)
CO2: 19 mmol/L — ABNORMAL LOW (ref 22–32)
CREATININE: 0.79 mg/dL (ref 0.44–1.00)
Chloride: 100 mmol/L — ABNORMAL LOW (ref 101–111)
Glucose, Bld: 87 mg/dL (ref 65–99)
Potassium: 4.2 mmol/L (ref 3.5–5.1)
Sodium: 125 mmol/L — ABNORMAL LOW (ref 135–145)
TOTAL PROTEIN: 6.1 g/dL — AB (ref 6.5–8.1)
Total Bilirubin: 1.9 mg/dL — ABNORMAL HIGH (ref 0.3–1.2)

## 2016-07-30 LAB — CBC
HCT: 26.4 % — ABNORMAL LOW (ref 36.0–46.0)
HEMOGLOBIN: 9.2 g/dL — AB (ref 12.0–15.0)
MCH: 30 pg (ref 26.0–34.0)
MCHC: 34.8 g/dL (ref 30.0–36.0)
MCV: 86 fL (ref 78.0–100.0)
Platelets: 249 10*3/uL (ref 150–400)
RBC: 3.07 MIL/uL — AB (ref 3.87–5.11)
RDW: 15.4 % (ref 11.5–15.5)
WBC: 15.4 10*3/uL — ABNORMAL HIGH (ref 4.0–10.5)

## 2016-07-30 MED ORDER — DIPHENHYDRAMINE HCL 25 MG PO CAPS: 25.0000 mg | ORAL_CAPSULE | Freq: Three times a day (TID) | ORAL | Status: AC | PRN

## 2016-07-30 MED ORDER — HYDROCERIN EX CREA: 1.0000 "application " | TOPICAL_CREAM | Freq: Two times a day (BID) | CUTANEOUS | Status: AC

## 2016-07-30 MED ORDER — PANTOPRAZOLE SODIUM 40 MG PO TBEC
40.0000 mg | DELAYED_RELEASE_TABLET | Freq: Every day | ORAL | Status: AC
Start: 2016-07-31 — End: ?

## 2016-07-30 MED ORDER — ENSURE ENLIVE PO LIQD: 237.0000 mL | Freq: Two times a day (BID) | ORAL | Status: DC

## 2016-07-30 MED ORDER — FUROSEMIDE 20 MG PO TABS: 60.0000 mg | ORAL_TABLET | Freq: Two times a day (BID) | ORAL | Status: DC

## 2016-07-30 MED ORDER — METRONIDAZOLE 500 MG PO TABS: 500.0000 mg | ORAL_TABLET | Freq: Three times a day (TID) | ORAL | Status: AC

## 2016-07-30 MED ORDER — LACTULOSE 10 GM/15ML PO SOLN: 30.0000 g | Freq: Every day | ORAL | Status: DC

## 2016-07-30 MED ORDER — ALPRAZOLAM 0.5 MG PO TABS: 0.5000 mg | ORAL_TABLET | Freq: Three times a day (TID) | ORAL | 0 refills | Status: DC | PRN

## 2016-07-30 MED ORDER — CIPROFLOXACIN HCL 500 MG PO TABS: 500.0000 mg | ORAL_TABLET | Freq: Two times a day (BID) | ORAL | 0 refills | Status: AC

## 2016-07-30 MED ORDER — POTASSIUM CHLORIDE CRYS ER 20 MEQ PO TBCR: 20.0000 meq | EXTENDED_RELEASE_TABLET | Freq: Every day | ORAL | Status: DC

## 2016-07-30 MED ORDER — FENTANYL 25 MCG/HR TD PT72: 25.0000 ug | MEDICATED_PATCH | TRANSDERMAL | 0 refills | Status: DC

## 2016-07-30 MED ORDER — OXYCODONE HCL 5 MG PO TABS: 5.0000 mg | ORAL_TABLET | Freq: Three times a day (TID) | ORAL | 0 refills | Status: DC | PRN

## 2016-07-30 NOTE — Discharge Summary (Signed)
Physician Discharge Summary  Christine NamJanet M Brittle ZOX:[id number] DOB: [birth year] DOA: 07/21/2016  PCP: Lonia BloodGARBA,LAWAL, MD  Admit date: 07/21/2016 Discharge date: 07/30/2016  Time spent: 35 minutes  Recommendations for Outpatient Follow-up:  1. Repeat CBC to follow Hgb trend  2. Repeat BMET to follow electrolytes and renal function  3. Follow volume and need for future paracentesis as an outpatient.   Discharge Diagnoses:  Active Problems:   Alcoholic cirrhosis of liver with ascites (HCC)   Anemia of chronic disease   Hyperammonemia (HCC)   Lactic acidosis   Pressure injury of skin   Protein-calorie malnutrition, severe   Abdominal distention   Ascites due to alcoholic cirrhosis (HCC)   Weakness   Discharge Condition: stable and improved. Discharge to SNF for further care and rehabilitation.  Diet recommendation: decrease sodium intake in her diet and higher protein intake  Filed Weights   07/21/16 1317  Weight: 88.5 kg (195 lb)    History of present illness:  As per H&P written by Dr. Filbert SchilderAlexandria U. [name] on 07/21/16 55 y.o. female with medical history significant of alcoholic liver disease with reoccurring ascites and repeated paracenteses presents with acute encephalopathy that began a couple of days ago.  Husband, not bedside but on the phone, states this morning patient could not stand.  He states he was up with her all night.  She has been taking her oral lactulose as prescribed per her husband but he says he realized that last night she was not having adequate bowel movements.  Husband and patient's caregiver have also noticed increased abdominal swelling for the past 3 days.  Per patient's husband patient is scheduled for a paracentesis in 3 days from admission but he did not feel she could wait.  Last paracentesis was at end of November and yielded 7L.  Husband states patient's quality of life has decreased substantially due to her recurrent ascites.  Patient has a caregiver at  home that stays with her while her husband is at work.    Hospital Course:  Alcoholic cirrhosis of liver with ascites: Hyponatremia stable and no thrombocytopenia. AST/ALT 54/20 in toxicity/EtOH pattern. Synthetic function impaired with INR 1.68, albumin 1.6.  - Had 2 paracentesis during hospitalization with approx 14 L removed. -empirically treated for SBP with flagyl and cipro; no fever and denies abd pain at discharge. 5 days of antibiotics pending at discharge. - Ascites refractory to diuretics, diuretics stopped on admission due to renal impairment. Resume at adjusted dose at discharge. - Plan to follow up with Dr. Loreta AveMann as outpatient - Appreciate palliative care team addressing potential irreversible decompensation and pain management: Started fentanyl 25mcg q72 hours and continue use of breakthrough oxycodone. Further adjustments as needed   Generalized weakness - PT eval - recommended SNF - rehabilitation as per SNF protocol  Anemia of chronic disease: Due to bone marrow suppression form alcohol use, at baseline.  -Monitor CBC to follow Hgb trend   Left lower lobe pneumonia - Ceftriaxone 12/18 >> 12/27  - Blood cx: NGTD - encourage to use Incentive spirometry due to risk of hypoinflation with ascites  Acute hepatic encephalopathy: Largely resolved since admission.   - Continue lactulose  - Ammonia level 113 on admission > to 60 on 12/21  Sacral pressure injury and LE bilateral skin injury related to venous insufficiency:  - Appreciate WOC assessment: Cleansing twice daily with a pH balanced skin cleanser and gently patting dry prior to applying a thin layer of Eucerin cream.Gently wrapping from toe to  knee with a roll gauze dressing, then topping it with light compression (13-16 mmHg) using an ACE bandage. -Elevation via Prevalon Boots and pillows to be continue as well -repositioning and skin care to prevent further injuries recommended.  Acute kidney injury: Improved and  back to baseline -most likely hepatorenal syndrome and dehydration -now stable to resume diuretics -lasix dose adjusted -will need close follow up on her BMET .   Hyperkalemia:  - resolved after Kayexalate given -will need close follow up of electrolytes with BMET; especially after resuming her spironolactone at discharge   Severe protein calorie malnutrition: Also a cause of hypoalbuminemia along with cirrhosis.  - Nutrition consulted and appreciate recommendations  - will follow feeding supplements as recommended -patient encourage to follow diet rec's as tolerated   Procedures:  Paracentesis 12/19 - 5 L fluid removed   Paracentesis 12/26 - 9.4L removed  Consultations:  IR  Palliative care   SW  PT  Nutrition   Discharge Exam: Vitals:   07/29/16 2049 07/30/16 0536  BP: (!) 101/47 (!) 99/55  Pulse: (!) 103 97  Resp: 18 18  Temp: 99.6 F (37.6 C) 98.3 F (36.8 C)   General exam: No acute distress  Respiratory system: Respiratory effort normal. No wheezing, no rhonchi  Cardiovascular system: S1 & S2 heard, Rate controlled  Gastrointestinal system: (+) BS, distended without tenderness or rebound. Dull to percussion and positive wave signs on exam. According to patient significantly improved after paracentesis on 12/26.  Central nervous system: Alert, oriented, nonfocal  Extremities: Palpable pulses. LE edema +1 with skin peeling  Skin: Bilateral LE excoriations, redness and +1 LE pitting edema, prevalon boots in place. Diffuse dry skin and almost stage 1 pressure injury on her sacral area. Psychiatry: Normal mood and behavior    Discharge Instructions   Discharge Instructions    Discharge instructions    Complete by:  As directed    Repeat BMET to follow electrolytes and renal function  Low sodium diet (looking to keep sodium intake to 2-2.5 gram daily) Maintain adequate hydration  Please arrange follow up with PCP in 2 weeks     Current Discharge  Medication List    START taking these medications   Details  ciprofloxacin (CIPRO) 500 MG tablet Take 1 tablet (500 mg total) by mouth 2 (two) times daily. Qty: 10 tablet, Refills: 0    diphenhydrAMINE (BENADRYL) 25 mg capsule Take 1 capsule (25 mg total) by mouth every 8 (eight) hours as needed for itching.    feeding supplement, ENSURE ENLIVE, (ENSURE ENLIVE) LIQD Take 237 mLs by mouth 2 (two) times daily between meals.    fentaNYL (DURAGESIC - DOSED MCG/HR) 25 MCG/HR patch Place 1 patch (25 mcg total) onto the skin every 3 (three) days. Qty: 5 patch, Refills: 0    hydrocerin (EUCERIN) CREA Apply 1 application topically 2 (two) times daily. Apply to her skin, to maintain good moisture level    metroNIDAZOLE (FLAGYL) 500 MG tablet Take 1 tablet (500 mg total) by mouth every 8 (eight) hours.    pantoprazole (PROTONIX) 40 MG tablet Take 1 tablet (40 mg total) by mouth daily.      CONTINUE these medications which have CHANGED   Details  ALPRAZolam (XANAX) 0.5 MG tablet Take 1 tablet (0.5 mg total) by mouth 3 (three) times daily as needed for anxiety. Qty: 20 tablet, Refills: 0    furosemide (LASIX) 20 MG tablet Take 3 tablets (60 mg total) by mouth 2 (two) times  daily.    lactulose (CHRONULAC) 10 GM/15ML solution Take 45 mLs (30 g total) by mouth daily.    oxyCODONE (OXY IR/ROXICODONE) 5 MG immediate release tablet Take 1 tablet (5 mg total) by mouth every 8 (eight) hours as needed for severe pain. Qty: 20 tablet, Refills: 0    potassium chloride SA (K-DUR,KLOR-CON) 20 MEQ tablet Take 1 tablet (20 mEq total) by mouth daily.      CONTINUE these medications which have NOT CHANGED   Details  ferrous sulfate 325 (65 FE) MG tablet Take 325 mg by mouth daily with breakfast.    hydrOXYzine (ATARAX/VISTARIL) 10 MG tablet Take 10 mg by mouth 2 (two) times daily.    spironolactone (ALDACTONE) 100 MG tablet Take 100 mg by mouth daily.    thiamine 100 MG tablet Take 1 tablet (100 mg  total) by mouth daily.    traZODone (DESYREL) 100 MG tablet Take 100 mg by mouth at bedtime.       Allergies  Allergen Reactions  . Ibuprofen Other (See Comments)    Pt is unable to take due to her hernia.    . Sulfa Antibiotics Nausea Only and Rash    Contact information for follow-up providers    GARBA,LAWAL, MD. Schedule an appointment as soon as possible for a visit in 2 week(s).   Specialty:  Internal Medicine Contact information: [address] [place] [address] [phone]            Contact information for after-discharge care    Destination    [facility] SN[facility] Follow up.   Specialty:  Skilled Nursing Facility Contact information: [address] [place] [address] [phone]                  The results of significant diagnostics from this hospitalization (including imaging, microbiology, ancillary and laboratory) are listed below for reference.    Significant Diagnostic Studies: Dg Chest 2 View  Result Date: 07/21/2016 CLINICAL DATA:  Hepatic cirrhosis with ascites and abdominal swelling EXAM: CHEST  2 VIEW COMPARISON:  May 22, 2016 FINDINGS: There is left lower lobe airspace consolidation. Lungs elsewhere clear. Heart is upper normal in size with pulmonary vascularity within normal limits. No adenopathy. There is degenerative change in each shoulder. IMPRESSION: Left lower lobe airspace consolidation. Lungs elsewhere clear. Cardiac silhouette within normal limits. Electronically Signed   By: Bretta Bang III M.D.   On: 07/21/2016 11:56   US Paracentesis  Result Date: 07/29/2016 INDICATION: Patient with alcoholic cirrhosis and recurrent ascites. Request made for therapeutic paracentesis. EXAM: ULTRASOUND GUIDED THERAPEUTIC PARACENTESIS MEDICATIONS: 10 mL 1% lidocaine COMPLICATIONS: None immediate. PROCEDURE: Informed written consent was obtained from the patient after a discussion of the risks,  benefits and alternatives to treatment. A timeout was performed prior to the initiation of the procedure. Initial ultrasound scanning demonstrates a large amount of ascites within the left lateral abdomen. The left lateral abdomen was prepped and draped in the usual sterile fashion. 1% lidocaine was used for local anesthesia. Following this, a 19 gauge, 7-cm, Yueh catheter was introduced. An ultrasound image was saved for documentation purposes. The paracentesis was performed. The catheter was removed and a dressing was applied. The patient tolerated the procedure well without immediate post procedural complication. FINDINGS: A total of approximately 9.4 liters of dark, amber, blood-tinged fluid was removed. Successful ultrasound-guided paracentesis yielding liters of peritoneal fluid. Read by:  Loyce Dys PA-C Electronically Signed   By: Merton Border  Hoss M.D.   On: 07/29/2016 12:25   US Paracentesis  Result Date: 07/22/2016 INDICATION: Alcoholic cirrhosis, recurrent ascites. Request made for diagnostic and therapeutic paracentesis. EXAM: ULTRASOUND GUIDED DIAGNOSTIC AND THERAPEUTIC PARACENTESIS MEDICATIONS: None. COMPLICATIONS: None immediate. PROCEDURE: Informed written consent was obtained from the patient after a discussion of the risks, benefits and alternatives to treatment. A timeout was performed prior to the initiation of the procedure. Initial ultrasound scanning demonstrates a large amount of ascites within the right lower abdominal quadrant. The right lower abdomen was prepped and draped in the usual sterile fashion. 1% lidocaine was used for local anesthesia. Following this, a Yueh catheter was introduced. An ultrasound image was saved for documentation purposes. The paracentesis was performed. The catheter was removed and a dressing was applied. The patient tolerated the procedure well without immediate post procedural complication. FINDINGS: A total of approximately 5 liters of yellow fluid was  removed. Samples were sent to the laboratory as requested by the clinical team.Due to hypotension only the above amount of fluid was removed today. IMPRESSION: Successful ultrasound-guided diagnostic and therapeutic paracentesis yielding 5 liters of peritoneal fluid. Read by: Jeananne Rama, PA-C Electronically Signed   By: Simonne Come M.D.   On: 07/22/2016 13:38   US Paracentesis  Result Date: 07/02/2016 INDICATION: Patient with history of cirrhosis and recurrent ascites presents for therapeutic paracentesis. EXAM: ULTRASOUND GUIDED THERAPEUTIC PARACENTESIS MEDICATIONS: 10 mL 1% lidocaine COMPLICATIONS: None immediate. PROCEDURE: Informed written consent was obtained from the patient after a discussion of the risks, benefits and alternatives to treatment. A timeout was performed prior to the initiation of the procedure. Initial ultrasound scanning demonstrates a moderate amount of ascites within the right lower abdomen. The right lower abdomen was prepped and draped in the usual sterile fashion. 1% lidocaine was used for local anesthesia. Following this, a 19 gauge, 7-cm, Yueh catheter was introduced. An ultrasound image was saved for documentation purposes. The paracentesis was performed. The catheter was removed and a dressing was applied. The patient tolerated the procedure well without immediate post procedural complication. FINDINGS: A total of approximately 7.0 liters of clear, yellow fluid was removed. IMPRESSION: Successful ultrasound-guided therapeutic paracentesis yielding 7 liters of peritoneal fluid. Read by:  Loyce Dys PA-C Electronically Signed   By: Malachy Moan M.D.   On: 07/02/2016 15:33    Microbiology: Recent Results (from the past 240 hour(s))  Blood culture (routine x 2)     Status: None   Collection Time: 07/21/16 12:50 PM  Result Value Ref Range Status   Specimen Description BLOOD LEFT HAND  Final   Special Requests IN PEDIATRIC BOTTLE 5CC  Final   Culture   Final     NO GROWTH 5 DAYS Performed at [facility]    Report Status 07/26/2016 FINAL  Final  Blood culture (routine x 2)     Status: None   Collection Time: 07/21/16 12:50 PM  Result Value Ref Range Status   Specimen Description BLOOD BLOOD RIGHT FOREARM  Final   Special Requests BOTTLES DRAWN AEROBIC AND ANAEROBIC 5CC  Final   Culture   Final    NO GROWTH 5 DAYS Performed at [facility]    Report Status 07/26/2016 FINAL  Final  Culture, body fluid-bottle     Status: None   Collection Time: 07/22/16 12:17 PM  Result Value Ref Range Status   Specimen Description FLUID PERITONEAL  Final   Special Requests NONE  Final   Culture   Final  NO GROWTH 5 DAYS Performed at [facility]    Report Status 07/27/2016 FINAL  Final  Gram stain     Status: None   Collection Time: 07/22/16 12:17 PM  Result Value Ref Range Status   Specimen Description PERITONEAL  Final   Special Requests NONE  Final   Gram Stain   Final    FEW WBC PRESENT, PREDOMINANTLY MONONUCLEAR NO ORGANISMS SEEN Performed at [facility]    Report Status 07/22/2016 FINAL  Final     Labs: Basic Metabolic Panel:  Recent Labs Lab 07/26/16 0606 07/27/16 0547 07/28/16 0517 07/29/16 0457 07/30/16 0500  NA 130* 129* 131* 127* 125*  K 4.5 4.9 5.3* 3.9 4.2  CL 103 102 107 101 100*  CO2 22 21* 20* 19* 19*  GLUCOSE 90 105* 85 88 87  BUN 24* 25* 23* 21* 22*  CREATININE 0.89 0.91 0.78 0.72 0.79  CALCIUM 7.7* 7.8* 7.8* 7.3* 7.3*   Liver Function Tests:  Recent Labs Lab 07/30/16 0500  AST 34  ALT 18  ALKPHOS 121  BILITOT 1.9*  PROT 6.1*  ALBUMIN 1.6*    Recent Labs Lab 07/24/16 0840  AMMONIA 60*   CBC:  Recent Labs Lab 07/26/16 0606 07/27/16 0547 07/28/16 0517 07/29/16 0457 07/30/16 0500  WBC 15.1* 15.2* 12.7* 11.3* 15.4*  HGB 8.6* 8.9* 8.2* 8.0* 9.2*  HCT 25.7* 25.7* 23.6* 22.7* 26.4*  MCV 86.0 86.5 86.4 86.6 86.0  PLT 188 188 185 187 249   BNP (last 3  results)  Recent Labs  04/18/16 1659 05/03/16 0019  BNP 118.3* 76.3    Signed:  Vassie Loll MD.  [facility] 07/30/2016, 4:45 PM

## 2016-07-30 NOTE — Clinical Social Work Placement (Signed)
Patient is set to discharge to [facility] today. Patient & husband, Christine [name] aware. Discharge packet given to RN, Delorise ShinerGrace. [facility] called for transport.     Lincoln MaxinKelly Epimenio Schetter, LCSW [facility] Clinical Social Worker cell #: [phone]    CLINICAL SOCIAL WORK PLACEMENT  NOTE  Date:  07/30/2016  Patient Details  Name: Christine [name] MRN: [id number] Date of Birth: February 11, 1961  Clinical Social Work is seeking post-discharge placement for this patient at the Skilled  Nursing Facility level of care (*CSW will initial, date and re-position this form in  chart as items are completed):  Yes   Patient/family provided with [place] Clinical Social Work Department's list of facilities offering this level of care within the geographic area requested by the patient (or if unable, by the patient's family).  Yes   Patient/family informed of their freedom to choose among providers that offer the needed level of care, that participate in Medicare, Medicaid or managed care program needed by the patient, have an available bed and are willing to accept the patient.  Yes   Patient/family informed of 's ownership interest in [facility] and [facility], as well as of the fact that they are under no obligation to receive care at these facilities.  PASRR submitted to EDS on       PASRR number received on       Existing PASRR number confirmed on 07/24/16     FL2 transmitted to all facilities in geographic area requested by pt/family on 07/24/16     FL2 transmitted to all facilities within larger geographic area on       Patient informed that his/her managed care company has contracts with or will negotiate with certain facilities, including the following:        Yes   Patient/family informed of bed offers received.  Patient chooses bed [facility]     Physician recommends and patient chooses bed at      [facility] on 07/30/16.  Patient to be transferred to facility by [facility]     Patient family notified on 07/30/16 of transfer.  Name of family member notified:  patient's husband, Christine [name]      PHYSICIAN       Additional Comment:    _______________________________________________ Arlyss RepressHarrison, Melenie Minniear F, LCSW 07/30/2016, 5:02 PM

## 2016-07-31 ENCOUNTER — Non-Acute Institutional Stay (SKILLED_NURSING_FACILITY): Payer: BLUE CROSS/BLUE SHIELD | Admitting: Adult Health

## 2016-07-31 ENCOUNTER — Other Ambulatory Visit: Payer: Self-pay

## 2016-07-31 ENCOUNTER — Encounter: Payer: Self-pay | Admitting: Adult Health

## 2016-07-31 DIAGNOSIS — K652 Spontaneous bacterial peritonitis: Secondary | ICD-10-CM | POA: Diagnosis not present

## 2016-07-31 DIAGNOSIS — E43 Unspecified severe protein-calorie malnutrition: Secondary | ICD-10-CM | POA: Diagnosis not present

## 2016-07-31 DIAGNOSIS — F324 Major depressive disorder, single episode, in partial remission: Secondary | ICD-10-CM

## 2016-07-31 DIAGNOSIS — F32A Depression, unspecified: Secondary | ICD-10-CM | POA: Insufficient documentation

## 2016-07-31 DIAGNOSIS — K7031 Alcoholic cirrhosis of liver with ascites: Secondary | ICD-10-CM | POA: Diagnosis not present

## 2016-07-31 DIAGNOSIS — R945 Abnormal results of liver function studies: Secondary | ICD-10-CM

## 2016-07-31 DIAGNOSIS — K7689 Other specified diseases of liver: Secondary | ICD-10-CM | POA: Diagnosis not present

## 2016-07-31 DIAGNOSIS — F329 Major depressive disorder, single episode, unspecified: Secondary | ICD-10-CM | POA: Insufficient documentation

## 2016-07-31 MED ORDER — OXYCODONE HCL 5 MG PO TABS: 5.0000 mg | ORAL_TABLET | Freq: Three times a day (TID) | ORAL | 0 refills | Status: DC | PRN

## 2016-07-31 MED ORDER — ALPRAZOLAM 0.5 MG PO TABS: 0.5000 mg | ORAL_TABLET | Freq: Three times a day (TID) | ORAL | 0 refills | Status: DC | PRN

## 2016-07-31 MED ORDER — FENTANYL 25 MCG/HR TD PT72
25.0000 ug | MEDICATED_PATCH | TRANSDERMAL | 0 refills | Status: DC
Start: 2016-08-01 — End: 2016-09-11

## 2016-07-31 NOTE — Progress Notes (Signed)
Location:   [place] Room Number: 146 Place of Service:  SNF (31)   CODE STATUS: DNR  Allergies  Allergen Reactions  . Ibuprofen Other (See Comments)    Pt is unable to take due to her hernia.    . Sulfa Antibiotics Nausea Only and Rash    Chief Complaint  Patient presents with  . Hospitalization Follow-up     HPI:  She has been hospitalized for her alcoholic cirrhosis of liver with ascites. She did require paracentesis twice while hospitalized. She was also treated for left lower lobe pneumonia and spontaneous bacterial peritonitis (SPB)/. She will need to complete her abt. She will need to have her abdominal girth monitored for worsening ascites. She is here for short term rehab with her goal to return back home. Pain management will be an issue for her as well. She is wanting her pain medication increased to every 4 hours as needed; she is presently on every 8 hours as needed and was on every 12 hours as needed in the hospital.    Past Medical History:  Diagnosis Date  . Abnormal Pap smear    displasia had colposcopy  . Anemia   . Arthritis   . Closed right humeral fracture    Humeral head fracture  . Depression    not currently on meds  . GERD (gastroesophageal reflux disease)   . Gout   . Hernia   . Hypertension   . Psoriasis     Past Surgical History:  Procedure Laterality Date  . COLPOSCOPY    . DILATION AND CURETTAGE OF UTERUS      Social History   Social History  . Marital status: Married    Spouse name: N/A  . Number of children: N/A  . Years of education: N/A   Occupational History  . Not on file.   Social History Main Topics  . Smoking status: Former Smoker    Packs/day: 1.00    Years: 15.00    Types: Cigarettes    Quit date: 04/18/2016  . Smokeless tobacco: Never Used     Comment: Admitted to the hospital 04/18/16  . Alcohol use No     Comment: couple glasses liquor day  . Drug use: No     Comment: Alcohol level  04/18/16 was 132 at admission  . Sexual activity: Not Currently   Other Topics Concern  . Not on file   Social History Narrative  . No narrative on file   Family History  Problem Relation Age of Onset  . Hypertension Mother   . Diabetes Mother   . Hypertension Father   . Diabetes Brother       VITAL SIGNS BP (!) 99/57   Pulse 96   Temp 98.1 F (36.7 C)   Resp 16   Ht '5\' 4"'$  (1.626 m)   Wt 172 lb (78 kg)   SpO2 99%   BMI 29.52 kg/m   Patient's Medications  New Prescriptions   No medications on file  Previous Medications   ALPRAZOLAM (XANAX) 0.5 MG TABLET    Take 1 tablet (0.5 mg total) by mouth 3 (three) times daily as needed for anxiety.   CIPROFLOXACIN (CIPRO) 500 MG TABLET    Take 1 tablet (500 mg total) by mouth 2 (two) times daily.   DIPHENHYDRAMINE (BENADRYL) 25 MG CAPSULE    Take 1 capsule (25 mg total) by mouth every 8 (eight) hours as needed for itching.   FEEDING SUPPLEMENT,  ENSURE ENLIVE, (ENSURE ENLIVE) LIQD    Take 237 mLs by mouth 2 (two) times daily between meals.   FENTANYL (DURAGESIC - DOSED MCG/HR) 25 MCG/HR PATCH    Place 1 patch (25 mcg total) onto the skin every 3 (three) days.   FERROUS SULFATE 325 (65 FE) MG TABLET    Take 325 mg by mouth daily with breakfast.   FUROSEMIDE (LASIX) 20 MG TABLET    Take 3 tablets (60 mg total) by mouth 2 (two) times daily.   HYDROCERIN (EUCERIN) CREA    Apply 1 application topically 2 (two) times daily. Apply to her skin, to maintain good moisture level   HYDROXYZINE (ATARAX/VISTARIL) 10 MG TABLET    Take 10 mg by mouth 2 (two) times daily.   LACTULOSE (CHRONULAC) 10 GM/15ML SOLUTION    Take 45 mLs (30 g total) by mouth daily.   METRONIDAZOLE (FLAGYL) 500 MG TABLET    Take 1 tablet (500 mg total) by mouth every 8 (eight) hours.   OXYCODONE (OXY IR/ROXICODONE) 5 MG IMMEDIATE RELEASE TABLET    Take 1 tablet (5 mg total) by mouth every 8 (eight) hours as needed for severe pain.   PANTOPRAZOLE (PROTONIX) 40 MG TABLET     Take 1 tablet (40 mg total) by mouth daily.   POTASSIUM CHLORIDE SA (K-DUR,KLOR-CON) 20 MEQ TABLET    Take 1 tablet (20 mEq total) by mouth daily.   SPIRONOLACTONE (ALDACTONE) 100 MG TABLET    Take 100 mg by mouth daily.   THIAMINE 100 MG TABLET    Take 1 tablet (100 mg total) by mouth daily.   TRAZODONE (DESYREL) 100 MG TABLET    Take 100 mg by mouth at bedtime.  Modified Medications   No medications on file  Discontinued Medications   No medications on file     SIGNIFICANT DIAGNOSTIC EXAMS  04-19-16: 2-d echo: - Left ventricle: The cavity size was normal. Wall thickness was normal. Systolic function was normal. The estimated ejection fraction was in the range of 60% to 65%. Wall motion was normal;  there were no regional wall motion abnormalities. Doppler parameters are consistent with abnormal left ventricular relaxation (grade 1 diastolic dysfunction).  07-21-16: chest x-ray; Left lower lobe airspace consolidation. Lungs elsewhere clear. Cardiac silhouette within normal limits.  07-22-16: paracentesis: A total of approximately 5 liters of yellow fluid was removed.  07-29-16: paracentesis: A total of approximately 9.4 liters of dark, amber, blood-tinged fluid was removed   LABS REVIEWED:   07-21-16: wbc 12.5; hgb 8.8; hct 24.2; mcv 84.6; plt 218; glucose 113; bun 36; creat 1.61; k+ 4.8; na++ 129; ast 59; alk phos 155; total bili 2.8; albumin 1.8; ammonia 115; blood culture: no growth 07-24-16: wbc 11.0; hgb 8.5; hct 24.3 ;mcv 85; plt 175; glucose 103; bun 27; creat 1.09; k+ 3.3; na++ 132; ammonia 60 07-27-16: wbc 15.2; hgb 8.9; hct 25.7; mcv 86.5; plt 188; glucose 105; bun 25; creat 0.91; k+ 4.9; na++ 129 07-30-16: wbc 15.4; hgb 9.2; hct 26.4; mcv 86.0; plt 249; glucose 87; bun 22; creat 0.79; k+ 4.2; na++ 125  ast 18; alk phos 121 total bili 1.9; albumin 1.9    Review of Systems  Constitutional: Positive for malaise/fatigue.  Respiratory: Negative for cough and shortness of  breath.   Cardiovascular: Negative for chest pain, palpitations and leg swelling.  Gastrointestinal: Negative for abdominal pain, constipation and heartburn.  Musculoskeletal: Positive for back pain and myalgias. Negative for joint pain.  Skin: Negative.  Neurological: Negative for dizziness.  Psychiatric/Behavioral: The patient is nervous/anxious.     Physical Exam  Constitutional: She is oriented to person, place, and time. No distress.  Frail malnourished   Eyes: Conjunctivae are normal.  Neck: Neck supple. No JVD present. No thyromegaly present.  Cardiovascular: Normal rate, regular rhythm and intact distal pulses.   Respiratory: Effort normal and breath sounds normal. No respiratory distress. She has no wheezes.  GI: Soft. Bowel sounds are normal. She exhibits distension. There is no tenderness.  Distension due to ascites.   Musculoskeletal: She exhibits no edema.  Able to move all extremities   Lymphadenopathy:    She has no cervical adenopathy.  Neurological: She is alert and oriented to person, place, and time.  Skin: Skin is warm and dry. She is not diaphoretic.  Bilateral lower extremities with kerlix in place; declining ace wraps at this time. A  Psychiatric:  Anxious     ASSESSMENT/ PLAN:  1.  Depression: will continue trazodone 100 mg nightly will continue xanax 0.5 mg three times daily as needed for anxiety  2. Anemia: hgb is 9.2 will continue iron daily  3. Hypokalemia: k+ is 4.2 will continue k+ 20 meq daily   4. Gerd: will continue protonix 40 mg daily   5.  Hyperammonemia: ammonia is 60 will continue chronulac 45 cc daily   6. Chronic pain due to chronic back pain: will continue duragesic 25 mcg patch every 3 days; will begin oxycodone 5 mg 1 or 2 tabs every 6 hours as needed; will monitor   7.  SBP: will complete cipro and flagyl for 5 days and will monitor her status.   8. Alcoholic cirrhosis with ascites: is status post paracentesis X2 for 14 liters  return:   will continue lasix 60 mg twice daily and aldactone 100 mg daily will have nursing monitor abdominal girth daily    9. eczema : will continue atarax 10 mg  twice daily   Will check cbc; cmp   Time spent with patient 50   minutes >50% time spent counseling; reviewing medical record; tests; labs; and developing future plan of care     MD is aware of resident's narcotic use and is in agreement with current plan of care. We will attempt to wean resident as apropriate   Ok Edwards NP [facility]  Contact [phone] Monday through Friday 8am- 5pm  After hours call [phone]

## 2016-07-31 NOTE — Telephone Encounter (Signed)
Prescription request was received from:  [facility]  [address] [place], [place] [id number]  PHONE: [phone]   Fax: [phone]  Rx for Oxycodone 5 mg, Alprazolam 0.5 mg, and Fentanyl 25 mcg/hr patch

## 2016-08-07 ENCOUNTER — Non-Acute Institutional Stay: Payer: BLUE CROSS/BLUE SHIELD | Admitting: Internal Medicine

## 2016-08-07 ENCOUNTER — Encounter: Payer: Self-pay | Admitting: Internal Medicine

## 2016-08-07 DIAGNOSIS — E43 Unspecified severe protein-calorie malnutrition: Secondary | ICD-10-CM | POA: Diagnosis not present

## 2016-08-07 DIAGNOSIS — K7682 Hepatic encephalopathy: Secondary | ICD-10-CM

## 2016-08-07 DIAGNOSIS — M549 Dorsalgia, unspecified: Secondary | ICD-10-CM

## 2016-08-07 DIAGNOSIS — G8929 Other chronic pain: Secondary | ICD-10-CM

## 2016-08-07 DIAGNOSIS — K219 Gastro-esophageal reflux disease without esophagitis: Secondary | ICD-10-CM

## 2016-08-07 DIAGNOSIS — K7031 Alcoholic cirrhosis of liver with ascites: Secondary | ICD-10-CM | POA: Diagnosis not present

## 2016-08-07 DIAGNOSIS — F411 Generalized anxiety disorder: Secondary | ICD-10-CM

## 2016-08-07 DIAGNOSIS — R5381 Other malaise: Secondary | ICD-10-CM

## 2016-08-07 DIAGNOSIS — R000 Tachycardia, unspecified: Secondary | ICD-10-CM

## 2016-08-07 DIAGNOSIS — K7290 Hepatic failure, unspecified without coma: Secondary | ICD-10-CM | POA: Diagnosis not present

## 2016-08-07 DIAGNOSIS — R791 Abnormal coagulation profile: Secondary | ICD-10-CM | POA: Diagnosis not present

## 2016-08-07 DIAGNOSIS — D638 Anemia in other chronic diseases classified elsewhere: Secondary | ICD-10-CM

## 2016-08-07 DIAGNOSIS — F324 Major depressive disorder, single episode, in partial remission: Secondary | ICD-10-CM | POA: Diagnosis not present

## 2016-08-07 NOTE — Progress Notes (Signed)
Patient ID: HADLYN AMERO, female   DOB: Oct 20, 1960, 56 y.o.   MRN: [id number]    HISTORY AND PHYSICAL   DATE: 08/07/2016  Location:    [place] [facility] Room Number: 146 A Place of Service: SNF (31)   Extended Emergency Contact Information Primary Emergency Contact: Willison,Stephen Address: [place],  [address] [place] Phone: [phone] Relation: Spouse  Advanced Directive information Does Patient Have a Medical Advance Directive?: Yes, Type of Advance Directive: Out of facility DNR (pink MOST or yellow form), Pre-existing out of facility DNR order (yellow form or pink MOST form): Yellow form placed in chart (order not valid for inpatient use)  Chief Complaint  Patient presents with  . New Admit To SNF    HPI:  56 yo female seen today as a new admission into SNF following hospital stay for Etoh cirrhosis of liver with ascites, electrolyte derangement, elevated ammonia level, severe protein calorie malnutriiton, LLL pneumonia, weakness, sacral pressure injury. She presented to the ED with acute encephalopathy. She is s/p paracentesis x 2 (12/19th and 12/26th) with total of 14L removed. She was empirically tx for SBP with flagyl and cipro. Palliative care consulted. Fentanyl patch Rx for pain mx. Other meds also adjusted. ALT 20; AST 54; INR 1.68; NH3 level 113-->60; albumin 1.6; total bilirubin 1.9; Na 125; K 4.2; WBC 15.4K; Hgb 9.2 at d/c. She presents to SNF for short term rehab.  Today she reports feeling tired and increased abdominal distension. No f/c. No N/V. She is a poor historian due to mental status. Hx obtained from chart. No nursing concerns. Poor appetite. No falls.  Depression/anxiety - stable on trazodone 100 mg nightly; xanax 0.5 mg three times daily as needed for anxiety  Anemia of chronic disease - stable on iron daily. Hgb 9.2 at d/c  Hypokalemia - stable on k+ 20 meq daily. K 4.2 at d/c   GERD -  stable on protonix 40 mg daily   Hyperammonemia - NH3 level 60 at d/c.  Takes chronulac 45 cc daily   Chronic pain due to chronic back pain - stable on duragesic 25 mcg patch every 3 days; oxycodone 5 mg 1 or 2 tabs every 6 hours as needed  Eczema - stable on atarax 10 mg  twice daily    Past Medical History:  Diagnosis Date  . Abnormal Pap smear    displasia had colposcopy  . Anemia   . Arthritis   . Closed right humeral fracture    Humeral head fracture  . Depression    not currently on meds  . GERD (gastroesophageal reflux disease)   . Gout   . Hernia   . Hypertension   . Psoriasis     Past Surgical History:  Procedure Laterality Date  . COLPOSCOPY    . DILATION AND CURETTAGE OF UTERUS      Patient Care Team: Elwyn Reach, MD as PCP - General (Internal Medicine)  Social History   Social History  . Marital status: Married    Spouse name: N/A  . Number of children: N/A  . Years of education: N/A   Occupational History  . Not on file.   Social History Main Topics  . Smoking status: Former Smoker    Packs/day: 1.00    Years: 15.00    Types: Cigarettes    Quit date: 04/18/2016  . Smokeless tobacco: Never Used  Comment: Admitted to the hospital 04/18/16  . Alcohol use No     Comment: couple glasses liquor day  . Drug use: No     Comment: Alcohol level 04/18/16 was 132 at admission  . Sexual activity: Not Currently   Other Topics Concern  . Not on file   Social History Narrative  . No narrative on file     reports that she quit smoking about 3 months ago. Her smoking use included Cigarettes. She has a 15.00 pack-year smoking history. She has never used smokeless tobacco. She reports that she does not drink alcohol or use drugs.  Family History  Problem Relation Age of Onset  . Hypertension Mother   . Diabetes Mother   . Hypertension Father   . Diabetes Brother    Family Status  Relation Status  . Mother Alive  . Father Alive  . Brother       Immunization History  Administered Date(s) Administered  . PPD Test 04/25/2016, 07/30/2016    Allergies  Allergen Reactions  . Ibuprofen Other (See Comments)    Pt is unable to take due to her hernia.    . Sulfa Antibiotics Nausea Only and Rash    Medications: Patient's Medications  New Prescriptions   No medications on file  Previous Medications   ALPRAZOLAM (XANAX) 0.5 MG TABLET    Take 1 tablet (0.5 mg total) by mouth 3 (three) times daily as needed for anxiety.   AMINO ACIDS-PROTEIN HYDROLYS (FEEDING SUPPLEMENT, PRO-STAT SUGAR FREE 64,) LIQD    Take 30 mLs by mouth 2 (two) times daily.   CIPROFLOXACIN (CIPRO) 500 MG TABLET    Take 500 mg by mouth 2 (two) times daily.   DIPHENHYDRAMINE (BENADRYL) 25 MG CAPSULE    Take 1 capsule (25 mg total) by mouth every 8 (eight) hours as needed for itching.   FEEDING SUPPLEMENT, ENSURE ENLIVE, (ENSURE ENLIVE) LIQD    Take 237 mLs by mouth 2 (two) times daily between meals.   FENTANYL (DURAGESIC - DOSED MCG/HR) 25 MCG/HR PATCH    Place 1 patch (25 mcg total) onto the skin every 3 (three) days.   FERROUS SULFATE 325 (65 FE) MG TABLET    Take 325 mg by mouth daily with breakfast.   FUROSEMIDE (LASIX) 20 MG TABLET    Take 3 tablets (60 mg total) by mouth 2 (two) times daily.   HYDROCERIN (EUCERIN) CREA    Apply 1 application topically 2 (two) times daily. Apply to her skin, to maintain good moisture level   HYDROXYZINE (ATARAX/VISTARIL) 10 MG TABLET    Take 10 mg by mouth 2 (two) times daily.   LACTULOSE (CHRONULAC) 10 GM/15ML SOLUTION    Take 45 mLs (30 g total) by mouth daily.   METRONIDAZOLE (FLAGYL) 500 MG TABLET    Take 500 mg by mouth 3 (three) times daily.   OXYCODONE (OXY IR/ROXICODONE) 5 MG IMMEDIATE RELEASE TABLET    Take 1 tablet (5 mg total) by mouth every 8 (eight) hours as needed for severe pain.   PANTOPRAZOLE (PROTONIX) 40 MG TABLET    Take 1 tablet (40 mg total) by mouth daily.   POTASSIUM CHLORIDE SA (K-DUR,KLOR-CON) 20 MEQ  TABLET    Take 1 tablet (20 mEq total) by mouth daily.   SPIRONOLACTONE (ALDACTONE) 100 MG TABLET    Take 100 mg by mouth daily.   THIAMINE 100 MG TABLET    Take 1 tablet (100 mg total) by mouth daily.   TRAZODONE (  DESYREL) 100 MG TABLET    Take 100 mg by mouth at bedtime.   UNABLE TO FIND    Med Name: Med Pass 240 mL daily  Modified Medications   No medications on file  Discontinued Medications   No medications on file    Review of Systems  Unable to perform ROS: Other    Vitals:   08/07/16 1021  BP: (!) 95/32  Pulse: 100  Resp: (!) 22  Temp: 98.8 F (37.1 C)  TempSrc: Oral  SpO2: 99%  Weight: 172 lb (78 kg)  Height: 5\' 4"  (1.626 m)   Body mass index is 29.52 kg/m.  Physical Exam  Constitutional: She appears well-developed.  Frail appearing, lying in bed in NAD  HENT:  Mouth/Throat: Oropharynx is clear and moist. No oropharyngeal exudate.  MMM; no oral lesions/thrush  Eyes: Pupils are equal, round, and reactive to light. No scleral icterus.  Neck: Neck supple. Carotid bruit is not present. No thyromegaly present.  Cardiovascular: Regular rhythm and intact distal pulses.  Tachycardia present.  Exam reveals no gallop and no friction rub.   Murmur (1/6 SEM) heard. +1 pitting LE edema b/l. No calf TTP  Pulmonary/Chest: Effort normal and breath sounds normal. No stridor. No respiratory distress. She has no wheezes. She has no rales.  Abdominal: Soft. Bowel sounds are normal. She exhibits distension (marked) and fluid wave. She exhibits no pulsatile liver, no abdominal bruit and no mass. There is hepatomegaly. There is tenderness (RUQ). There is no rigidity, no rebound and no guarding.  Musculoskeletal: She exhibits edema.  Lymphadenopathy:    She has no cervical adenopathy.  Neurological: She is alert.  Skin: Skin is warm and dry. No rash noted.  Stage 1 sacral ulcer with redness of skin. No skin breakdown. No d/c or TTP  Psychiatric: She has a normal mood and affect. Her  behavior is normal.     Labs reviewed: Admission on 07/21/2016, Discharged on 07/30/2016  No results displayed because visit has over 200 results.    Nursing Home on 05/30/2016  Component Date Value Ref Range Status  . Glucose 05/16/2016 113  mg/dL Final  . BUN 05/18/2016 30* 4 - 21 mg/dL Final  . Creatinine 91/99/1811 0.9  0.5 - 1.1 mg/dL Final  . Potassium 27/93/6828 3.7  3.4 - 5.3 mmol/L Final  . Sodium 05/16/2016 128* 137 - 147 mmol/L Final  . Glucose 05/13/2016 110  mg/dL Final  . BUN 07/13/2016 23* 4 - 21 mg/dL Final  . Creatinine 25/00/2006 1.0  0.5 - 1.1 mg/dL Final  . Potassium 16/28/1795 3.4  3.4 - 5.3 mmol/L Final  . Sodium 05/13/2016 129* 137 - 147 mmol/L Final  Hospital Outpatient Visit on 05/23/2016  Component Date Value Ref Range Status  . LD, Fluid 05/23/2016 48* 3 - 23 U/L Final   Comment: (NOTE) Results should be evaluated in conjunction with serum values Performed at [facility]   . Fluid Type-FLDH 05/23/2016 Peritoneal   Final  . Total protein, fluid 05/23/2016 <3.0  g/dL Final   Comment: (NOTE) No normal range established for this test Results should be evaluated in conjunction with serum values Performed at [facility]   . Fluid Type-FTP 05/23/2016 Peritoneal   Final  . Fluid Type-FCT 05/23/2016 Peritoneal   Final  . Color, Fluid 05/23/2016 YELLOW  YELLOW Final  . Appearance, Fluid 05/23/2016 CLEAR  CLEAR Final  . WBC, Fluid 05/23/2016 107  0 - 1,000 cu mm Final  . Neutrophil  Count, Fluid 05/23/2016 2  0 - 25 % Final  . Lymphs, Fluid 05/23/2016 18  % Final  . Monocyte-Macrophage-Serous Fluid 05/23/2016 80  50 - 90 % Final  . Other Cells, Fluid 05/23/2016 OTHER CELLS IDENTIFIED AS MESOTHELIAL CELLS  % Final  . Glucose, Fluid 05/23/2016 124  mg/dL Final   Comment: (NOTE) No normal range established for this test Results should be evaluated in conjunction with serum values Performed at [facility]   . Fluid Type-FGLU  05/23/2016 Peritoneal   Final  . Path Review 05/26/2016 Reviewed By Violet Baldy, M.D.   Final   Comment: 10.23.17 REACTIVE MESOTHELIAL CELLS PRESENT.   Marland Kitchen Specimen Description 05/28/2016 FLUID PERITONEAL   Final  . Special Requests 05/28/2016 BOTTLES DRAWN AEROBIC AND ANAEROBIC 10CC   Final  . Culture 05/28/2016    Final                   Value:NO GROWTH 5 DAYS Performed at [facility]   . Report Status 05/28/2016 05/28/2016 FINAL   Final  . Specimen Description 05/23/2016 FLUID PERITONEAL   Final  . Special Requests 05/23/2016 NONE   Final  . Gram Stain 05/23/2016    Final                   Value:RARE WBC PRESENT, PREDOMINANTLY MONONUCLEAR NO ORGANISMS SEEN Performed at [facility]   . Report Status 05/23/2016 05/23/2016 FINAL   Final  Admission on 05/22/2016, Discharged on 05/22/2016  Component Date Value Ref Range Status  . Lipase 05/22/2016 19  11 - 51 U/L Final  . Sodium 05/22/2016 133* 135 - 145 mmol/L Final  . Potassium 05/22/2016 3.6  3.5 - 5.1 mmol/L Final  . Chloride 05/22/2016 104  101 - 111 mmol/L Final  . CO2 05/22/2016 19* 22 - 32 mmol/L Final  . Glucose, Bld 05/22/2016 105* 65 - 99 mg/dL Final  . BUN 05/22/2016 28* 6 - 20 mg/dL Final  . Creatinine, Ser 05/22/2016 0.72  0.44 - 1.00 mg/dL Final  . Calcium 05/22/2016 7.9* 8.9 - 10.3 mg/dL Final  . Total Protein 05/22/2016 6.3* 6.5 - 8.1 g/dL Final  . Albumin 05/22/2016 2.0* 3.5 - 5.0 g/dL Final  . AST 05/22/2016 196* 15 - 41 U/L Final  . ALT 05/22/2016 63* 14 - 54 U/L Final  . Alkaline Phosphatase 05/22/2016 210* 38 - 126 U/L Final  . Total Bilirubin 05/22/2016 12.9* 0.3 - 1.2 mg/dL Final  . GFR calc non Af Amer 05/22/2016 >60  >60 mL/min Final  . GFR calc Af Amer 05/22/2016 >60  >60 mL/min Final   Comment: (NOTE) The eGFR has been calculated using the CKD EPI equation. This calculation has not been validated in all clinical situations. eGFR's persistently <60 mL/min signify possible Chronic  Kidney Disease.   . Anion gap 05/22/2016 10  5 - 15 Final  . WBC 05/22/2016 13.7* 4.0 - 10.5 K/uL Final  . RBC 05/22/2016 3.15* 3.87 - 5.11 MIL/uL Final  . Hemoglobin 05/22/2016 10.6* 12.0 - 15.0 g/dL Final  . HCT 05/22/2016 31.3* 36.0 - 46.0 % Final  . MCV 05/22/2016 99.4  78.0 - 100.0 fL Final  . MCH 05/22/2016 33.7  26.0 - 34.0 pg Final  . MCHC 05/22/2016 33.9  30.0 - 36.0 g/dL Final  . RDW 05/22/2016 17.2* 11.5 - 15.5 % Final  . Platelets 05/22/2016 184  150 - 400 K/uL Final  . Color, Urine 05/22/2016 AMBER* YELLOW Final  .  APPearance 05/22/2016 CLOUDY* CLEAR Final  . Specific Gravity, Urine 05/22/2016 1.011  1.005 - 1.030 Final  . pH 05/22/2016 5.5  5.0 - 8.0 Final  . Glucose, UA 05/22/2016 NEGATIVE  NEGATIVE mg/dL Final  . Hgb urine dipstick 05/22/2016 NEGATIVE  NEGATIVE Final  . Bilirubin Urine 05/22/2016 MODERATE* NEGATIVE Final  . Ketones, ur 05/22/2016 NEGATIVE  NEGATIVE mg/dL Final  . Protein, ur 91/15/3514 NEGATIVE  NEGATIVE mg/dL Final  . Nitrite 82/59/8378 POSITIVE* NEGATIVE Final  . Leukocytes, UA 05/22/2016 TRACE* NEGATIVE Final  . Troponin i, poc 05/22/2016 0.00  0.00 - 0.08 ng/mL Final  . Comment 3 05/22/2016          Final   Comment: Due to the release kinetics of cTnI, a negative result within the first hours of the onset of symptoms does not rule out myocardial infarction with certainty. If myocardial infarction is still suspected, repeat the test at appropriate intervals.   . Prothrombin Time 05/22/2016 19.0* 11.4 - 15.2 seconds Final  . INR 05/22/2016 1.58   Final  . Squamous Epithelial / LPF 05/22/2016 0-5* NONE SEEN Final  . WBC, UA 05/22/2016 0-5  0 - 5 WBC/hpf Final  . RBC / HPF 05/22/2016 0-5  0 - 5 RBC/hpf Final  . Bacteria, UA 05/22/2016 MANY* NONE SEEN Final    Dg Chest 2 View  Result Date: 07/21/2016 CLINICAL DATA:  Hepatic cirrhosis with ascites and abdominal swelling EXAM: CHEST  2 VIEW COMPARISON:  May 22, 2016 FINDINGS: There is left  lower lobe airspace consolidation. Lungs elsewhere clear. Heart is upper normal in size with pulmonary vascularity within normal limits. No adenopathy. There is degenerative change in each shoulder. IMPRESSION: Left lower lobe airspace consolidation. Lungs elsewhere clear. Cardiac silhouette within normal limits. Electronically Signed   By: Bretta Bang III M.D.   On: 07/21/2016 11:56   US Paracentesis  Result Date: 07/29/2016 INDICATION: Patient with alcoholic cirrhosis and recurrent ascites. Request made for therapeutic paracentesis. EXAM: ULTRASOUND GUIDED THERAPEUTIC PARACENTESIS MEDICATIONS: 10 mL 1% lidocaine COMPLICATIONS: None immediate. PROCEDURE: Informed written consent was obtained from the patient after a discussion of the risks, benefits and alternatives to treatment. A timeout was performed prior to the initiation of the procedure. Initial ultrasound scanning demonstrates a large amount of ascites within the left lateral abdomen. The left lateral abdomen was prepped and draped in the usual sterile fashion. 1% lidocaine was used for local anesthesia. Following this, a 19 gauge, 7-cm, Yueh catheter was introduced. An ultrasound image was saved for documentation purposes. The paracentesis was performed. The catheter was removed and a dressing was applied. The patient tolerated the procedure well without immediate post procedural complication. FINDINGS: A total of approximately 9.4 liters of dark, amber, blood-tinged fluid was removed. Successful ultrasound-guided paracentesis yielding liters of peritoneal fluid. Read by:  Loyce Dys PA-C Electronically Signed   By: Jolaine Click M.D.   On: 07/29/2016 12:25   US Paracentesis  Result Date: 07/22/2016 INDICATION: Alcoholic cirrhosis, recurrent ascites. Request made for diagnostic and therapeutic paracentesis. EXAM: ULTRASOUND GUIDED DIAGNOSTIC AND THERAPEUTIC PARACENTESIS MEDICATIONS: None. COMPLICATIONS: None immediate. PROCEDURE:  Informed written consent was obtained from the patient after a discussion of the risks, benefits and alternatives to treatment. A timeout was performed prior to the initiation of the procedure. Initial ultrasound scanning demonstrates a large amount of ascites within the right lower abdominal quadrant. The right lower abdomen was prepped and draped in the usual sterile fashion. 1% lidocaine was used for local anesthesia.  Following this, a Yueh catheter was introduced. An ultrasound image was saved for documentation purposes. The paracentesis was performed. The catheter was removed and a dressing was applied. The patient tolerated the procedure well without immediate post procedural complication. FINDINGS: A total of approximately 5 liters of yellow fluid was removed. Samples were sent to the laboratory as requested by the clinical team.Due to hypotension only the above amount of fluid was removed today. IMPRESSION: Successful ultrasound-guided diagnostic and therapeutic paracentesis yielding 5 liters of peritoneal fluid. Read by: Rowe Robert, PA-C Electronically Signed   By: Sandi Mariscal M.D.   On: 07/22/2016 13:38     Assessment/Plan   ICD-9-CM ICD-10-CM   1. Ascites due to alcoholic cirrhosis (HCC) 023.3 K70.31   2. Protein-calorie malnutrition, severe 262 E43   3. Physical deconditioning 799.3 R53.81   4. Anemia of chronic disease 285.29 D63.8   5. Prolonged INR 790.92 R79.1    due to liver disease  6. Major depressive disorder with single episode, in partial remission ([place]) 296.25 F32.4   7. Tachycardia 785.0 R00.0   8. Hepatic encephalopathy (HCC) 572.2 K72.90   9. Anxiety state 300.00 F41.1   10. Chronic back pain, unspecified back location, unspecified back pain laterality 724.5 M54.9    338.29 G89.29   11. Gastroesophageal reflux disease without esophagitis 530.81 K21.9      Consult IR for paracentesis. T/c albumin infusion if >5L removed  Check CBC and cmp  Cont current meds as  ordered  Goal BM 3-5 loose stools per day  Cont nutritional supplements as ordered  Wound care as ordered  PT/OT/ST as tolerated  f/u with specialists as scheduled  Daily weight and record. Follow abdominal girth  GOAL: short term rehab and d/c home when medically appropriate. Communicated with pt and nursing.  Will follow  Kerman Pfost S. Perlie Gold  [facility] [address] [place], [place] [address] [phone] Cell (Monday-Friday 8 AM - 5 PM) [phone] After 5 PM and follow prompts

## 2016-08-08 ENCOUNTER — Other Ambulatory Visit: Payer: Self-pay | Admitting: Adult Health

## 2016-08-08 LAB — BASIC METABOLIC PANEL
BUN: 70 mg/dL — AB (ref 4–21)
CREATININE: 2.2 mg/dL — AB (ref 0.5–1.1)
Glucose: 78 mg/dL
POTASSIUM: 6.5 mmol/L — AB (ref 3.4–5.3)
SODIUM: 129 mmol/L — AB (ref 137–147)

## 2016-08-08 LAB — HEPATIC FUNCTION PANEL
ALK PHOS: 143 U/L — AB (ref 25–125)
ALT: 11 U/L (ref 7–35)
AST: 30 U/L (ref 13–35)
Bilirubin, Total: 1.5 mg/dL

## 2016-08-08 LAB — CBC AND DIFFERENTIAL
HCT: 25 % — AB (ref 36–46)
Hemoglobin: 8.2 g/dL — AB (ref 12.0–16.0)
Platelets: 247 10*3/uL (ref 150–399)
WBC: 12.2 10^3/mL

## 2016-08-11 ENCOUNTER — Other Ambulatory Visit: Payer: Self-pay | Admitting: Internal Medicine

## 2016-08-11 DIAGNOSIS — K7031 Alcoholic cirrhosis of liver with ascites: Secondary | ICD-10-CM

## 2016-08-13 ENCOUNTER — Ambulatory Visit (HOSPITAL_COMMUNITY)
Admission: RE | Admit: 2016-08-13 | Discharge: 2016-08-13 | Disposition: A | Discharge status: Home / Self Care | Payer: BLUE CROSS/BLUE SHIELD | Source: Ambulatory Visit | Attending: Internal Medicine | Admitting: Internal Medicine

## 2016-08-13 DIAGNOSIS — K7031 Alcoholic cirrhosis of liver with ascites: Secondary | ICD-10-CM | POA: Diagnosis present

## 2016-08-13 NOTE — Procedures (Signed)
Ultrasound-guided  therapeutic paracentesis performed yielding 7.2 liters of yellow fluid. No immediate complications. Only the above amount of fluid was removed today secondary to pt hypotension.

## 2016-08-19 ENCOUNTER — Other Ambulatory Visit: Payer: Self-pay | Admitting: Internal Medicine

## 2016-08-21 DIAGNOSIS — K652 Spontaneous bacterial peritonitis: Secondary | ICD-10-CM | POA: Insufficient documentation

## 2016-08-22 ENCOUNTER — Other Ambulatory Visit: Payer: Self-pay | Admitting: Internal Medicine

## 2016-08-22 DIAGNOSIS — K7030 Alcoholic cirrhosis of liver without ascites: Secondary | ICD-10-CM

## 2016-08-25 ENCOUNTER — Non-Acute Institutional Stay (SKILLED_NURSING_FACILITY): Payer: BLUE CROSS/BLUE SHIELD | Admitting: Adult Health

## 2016-08-25 ENCOUNTER — Encounter: Payer: Self-pay | Admitting: Adult Health

## 2016-08-25 DIAGNOSIS — N183 Chronic kidney disease, stage 3 (moderate): Secondary | ICD-10-CM

## 2016-08-25 DIAGNOSIS — K7031 Alcoholic cirrhosis of liver with ascites: Secondary | ICD-10-CM

## 2016-08-25 DIAGNOSIS — N179 Acute kidney failure, unspecified: Secondary | ICD-10-CM | POA: Diagnosis not present

## 2016-08-25 NOTE — Progress Notes (Signed)
Location:   [facility] Room Number: 146 Place of Service:  SNF (31)   CODE STATUS: dnr  Allergies  Allergen Reactions  . Ibuprofen Other (See Comments)    Pt is unable to take due to her hernia.    . Sulfa Antibiotics Nausea Only and Rash    Chief Complaint  Patient presents with  . Acute Visit    Renal failure    HPI:  Her renal function has worsened. She will need IVF; however; her ascites is slowly getting worse. We discuss her renal function. She tells me that she does understand the information. Her friend is at her bedside. She is due for a paracentesis.   Past Medical History:  Diagnosis Date  . Abnormal Pap smear    displasia had colposcopy  . Anemia   . Arthritis   . Closed right humeral fracture    Humeral head fracture  . Depression    not currently on meds  . GERD (gastroesophageal reflux disease)   . Gout   . Hernia   . Hypertension   . Psoriasis     Past Surgical History:  Procedure Laterality Date  . COLPOSCOPY    . DILATION AND CURETTAGE OF UTERUS      Social History   Social History  . Marital status: Married    Spouse name: N/A  . Number of children: N/A  . Years of education: N/A   Occupational History  . Not on file.   Social History Main Topics  . Smoking status: Former Smoker    Packs/day: 1.00    Years: 15.00    Types: Cigarettes    Quit date: 04/18/2016  . Smokeless tobacco: Never Used     Comment: Admitted to the hospital 04/18/16  . Alcohol use No     Comment: couple glasses liquor day  . Drug use: No     Comment: Alcohol level 04/18/16 was 132 at admission  . Sexual activity: Not Currently   Other Topics Concern  . Not on file   Social History Narrative  . No narrative on file   Family History  Problem Relation Age of Onset  . Hypertension Mother   . Diabetes Mother   . Hypertension Father   . Diabetes Brother       VITAL SIGNS BP (!) 110/58   Pulse 100   Temp 97.7 F (36.5 C)   Resp  18   Ht '5\' 4"'$  (1.626 m)   Wt 172 lb (78 kg)   SpO2 99%   BMI 29.52 kg/m   Patient's Medications  New Prescriptions   No medications on file  Previous Medications   ALPRAZOLAM (XANAX) 0.5 MG TABLET    Take 1 tablet (0.5 mg total) by mouth 3 (three) times daily as needed for anxiety.   AMINO ACIDS-PROTEIN HYDROLYS (FEEDING SUPPLEMENT, PRO-STAT SUGAR FREE 64,) LIQD    Take 30 mLs by mouth 2 (two) times daily.   DIPHENHYDRAMINE (BENADRYL) 25 MG CAPSULE    Take 1 capsule (25 mg total) by mouth every 8 (eight) hours as needed for itching.   FENTANYL (DURAGESIC - DOSED MCG/HR) 25 MCG/HR PATCH    Place 1 patch (25 mcg total) onto the skin every 3 (three) days.   FERROUS SULFATE 325 (65 FE) MG TABLET    Take 325 mg by mouth daily with breakfast.   FUROSEMIDE (LASIX) 20 MG TABLET    Take 3 tablets (60 mg total) by mouth 2 (  two) times daily.   HYDROCERIN (EUCERIN) CREA    Apply 1 application topically 2 (two) times daily. Apply to her skin, to maintain good moisture level   HYDROXYZINE (ATARAX/VISTARIL) 10 MG TABLET    Take 10 mg by mouth 2 (two) times daily.   LACTULOSE (CHRONULAC) 10 GM/15ML SOLUTION    Take 45 mLs (30 g total) by mouth daily.   MULTIPLE VITAMINS-MINERALS (DECUBI-VITE) CAPS    Take 1 capsule by mouth daily.   OXYCODONE (OXY IR/ROXICODONE) 5 MG IMMEDIATE RELEASE TABLET    Give 5 to 10 mg by mouth every 6 hours as needed for moderate to severe pain related to Alcoholic Cirrhosis of Liver with Ascites   PANTOPRAZOLE (PROTONIX) 40 MG TABLET    Take 1 tablet (40 mg total) by mouth daily.   POTASSIUM CHLORIDE SA (K-DUR,KLOR-CON) 20 MEQ TABLET    Take 1 tablet (20 mEq total) by mouth daily.   SPIRONOLACTONE (ALDACTONE) 100 MG TABLET    Take 100 mg by mouth daily.   THIAMINE 100 MG TABLET    Take 1 tablet (100 mg total) by mouth daily.   TRAZODONE (DESYREL) 100 MG TABLET    Take 100 mg by mouth at bedtime.   UNABLE TO FIND    Med Name: Med Pass 240 mL daily  Modified Medications   No  medications on file  Discontinued Medications   CIPROFLOXACIN (CIPRO) 500 MG TABLET    Take 500 mg by mouth 2 (two) times daily.   FEEDING SUPPLEMENT, ENSURE ENLIVE, (ENSURE ENLIVE) LIQD    Take 237 mLs by mouth 2 (two) times daily between meals.   METRONIDAZOLE (FLAGYL) 500 MG TABLET    Take 500 mg by mouth 3 (three) times daily.   OXYCODONE (OXY IR/ROXICODONE) 5 MG IMMEDIATE RELEASE TABLET    Take 1 tablet (5 mg total) by mouth every 8 (eight) hours as needed for severe pain.     SIGNIFICANT DIAGNOSTIC EXAMS   04-19-16: 2-d echo: - Left ventricle: The cavity size was normal. Wall thickness was normal. Systolic function was normal. The estimated ejection fraction was in the range of 60% to 65%. Wall motion was normal;  there were no regional wall motion abnormalities. Doppler parameters are consistent with abnormal left ventricular relaxation (grade 1 diastolic dysfunction).  07-21-16: chest x-ray; Left lower lobe airspace consolidation. Lungs elsewhere clear. Cardiac silhouette within normal limits.  07-22-16: paracentesis: A total of approximately 5 liters of yellow fluid was removed.  07-29-16: paracentesis: A total of approximately 9.4 liters of dark, amber, blood-tinged fluid was removed  08-08-16: EKG: sinus tachycardia   LABS REVIEWED:   07-21-16: wbc 12.5; hgb 8.8; hct 24.2; mcv 84.6; plt 218; glucose 113; bun 36; creat 1.61; k+ 4.8; na++ 129; ast 59; alk phos 155; total bili 2.8; albumin 1.8; ammonia 115; blood culture: no growth 07-24-16: wbc 11.0; hgb 8.5; hct 24.3 ;mcv 85; plt 175; glucose 103; bun 27; creat 1.09; k+ 3.3; na++ 132; ammonia 60 07-27-16: wbc 15.2; hgb 8.9; hct 25.7; mcv 86.5; plt 188; glucose 105; bun 25; creat 0.91; k+ 4.9; na++ 129 07-30-16: wbc 15.4; hgb 9.2; hct 26.4; mcv 86.0; plt 249; glucose 87; bun 22; creat 0.79; k+ 4.2; na++ 125  ast 18; alk phos 121 total bili 1.9; albumin 1.9  08-08-16: wbc 12.2; hgb 8.2; hct 25.4; mcv 94.2; plt 247; glucose 78; bun  70.4; creat 2.20; k+ 6.5; na++ 129;ast 30; alt 11; alk phos 143 total bili 1.4  08-11-16: glucose  110; bun 70.8; creat 1.76; k+ 5.8; na++ 127; ast 37; alt 14; alk phos 134 total bili 1.9 08-25-16: glucose 92; bun 87.4; creat 2.01; k+ 5.6; na++ 128; ca 7.7     Review of Systems  Constitutional: Positive for malaise/fatigue.  Respiratory: Negative for cough and shortness of breath.   Cardiovascular: Negative for chest pain, palpitations and leg swelling.  Gastrointestinal: Negative for abdominal pain, constipation and heartburn.  Musculoskeletal: Positive for back pain and myalgias. Negative for joint pain.  Skin: Negative.   Neurological: Negative for dizziness.  Psychiatric/Behavioral: The patient is nervous/anxious.     Physical Exam  Constitutional: She is oriented to person, place, and time. No distress.  Frail malnourished   Eyes: Conjunctivae are normal.  Neck: Neck supple. No JVD present. No thyromegaly present.  Cardiovascular: Normal rate, regular rhythm and intact distal pulses.   Respiratory: Effort normal and breath sounds normal. No respiratory distress. She has no wheezes.  GI: Soft. Bowel sounds are normal. She exhibits distension. There is no tenderness.  Distension due to ascites.   Musculoskeletal: She exhibits no edema.  Able to move all extremities   Lymphadenopathy:    She has no cervical adenopathy.  Neurological: She is alert and oriented to person, place, and time.  Skin: Skin is warm and dry. She is not diaphoretic.  Psychiatric:  More calm today    ASSESSMENT/ PLAN:  1. Alcoholic cirrhosis with ascites: is status post paracentesis X2 for 14 liters return:   will continue lasix 60 mg twice daily and aldactone 100 mg daily  She is due for paracentesis in the AM will need 50 gm albumin at that time.    2. Acute on chronic renal failure: will begin NS at 75 cc per hour for 2 liters    Time spent with patient 45   minutes >50% time spent counseling;  reviewing medical record; tests; labs; and developing future plan of care      MD is aware of resident's narcotic use and is in agreement with current plan of care. We will attempt to wean resident as apropriate   Ok Edwards NP [facility]  Contact [phone] Monday through Friday 8am- 5pm  After hours call [phone]

## 2016-08-26 ENCOUNTER — Ambulatory Visit (HOSPITAL_COMMUNITY)
Admission: RE | Admit: 2016-08-26 | Discharge: 2016-08-26 | Disposition: A | Discharge status: Home / Self Care | Payer: BLUE CROSS/BLUE SHIELD | Source: Ambulatory Visit | Attending: Internal Medicine | Admitting: Internal Medicine

## 2016-08-26 DIAGNOSIS — K7030 Alcoholic cirrhosis of liver without ascites: Secondary | ICD-10-CM

## 2016-08-26 DIAGNOSIS — K7031 Alcoholic cirrhosis of liver with ascites: Secondary | ICD-10-CM | POA: Insufficient documentation

## 2016-08-26 NOTE — Procedures (Signed)
Ultrasound-guided  therapeutic paracentesis performed yielding 9.3 liters of yellow fluid. No immediate complications.

## 2016-08-29 ENCOUNTER — Encounter: Payer: Self-pay | Admitting: Adult Health

## 2016-08-29 ENCOUNTER — Non-Acute Institutional Stay (SKILLED_NURSING_FACILITY): Payer: BLUE CROSS/BLUE SHIELD | Admitting: Adult Health

## 2016-08-29 DIAGNOSIS — K7031 Alcoholic cirrhosis of liver with ascites: Secondary | ICD-10-CM | POA: Diagnosis not present

## 2016-08-29 NOTE — Progress Notes (Signed)
Location:  [facility] Room Number: 146 P Place of Service:  SNF (31)   CODE STATUS: DNR  Allergies  Allergen Reactions  . Ibuprofen Other (See Comments)    Pt is unable to take due to her hernia.    . Sulfa Antibiotics Nausea Only and Rash    Chief Complaint  Patient presents with  . Acute Visit    Pain management    HPI:  She is telling me that her pain is not being adequately managed with her current regimen. She is not able to remember to ask for her pain medication until she is in severe pain; and then the medication does not work as well as it should.   Past Medical History:  Diagnosis Date  . Abnormal Pap smear    displasia had colposcopy  . Anemia   . Arthritis   . Closed right humeral fracture    Humeral head fracture  . Depression    not currently on meds  . GERD (gastroesophageal reflux disease)   . Gout   . Hernia   . Hypertension   . Psoriasis     Past Surgical History:  Procedure Laterality Date  . COLPOSCOPY    . DILATION AND CURETTAGE OF UTERUS      Social History   Social History  . Marital status: Married    Spouse name: N/A  . Number of children: N/A  . Years of education: N/A   Occupational History  . Not on file.   Social History Main Topics  . Smoking status: Former Smoker    Packs/day: 1.00    Years: 15.00    Types: Cigarettes    Quit date: 04/18/2016  . Smokeless tobacco: Never Used     Comment: Admitted to the hospital 04/18/16  . Alcohol use No     Comment: couple glasses liquor day  . Drug use: No     Comment: Alcohol level 04/18/16 was 132 at admission  . Sexual activity: Not Currently   Other Topics Concern  . Not on file   Social History Narrative  . No narrative on file   Family History  Problem Relation Age of Onset  . Hypertension Mother   . Diabetes Mother   . Hypertension Father   . Diabetes Brother       VITAL SIGNS BP (!) 96/41   Pulse (!) 101   Temp 97.5 F (36.4 C)   Resp 20    Ht '5\' 4"'$  (1.626 m)   Wt 172 lb (78 kg)   SpO2 99%   BMI 29.52 kg/m   Patient's Medications  New Prescriptions   No medications on file  Previous Medications   ALPRAZOLAM (XANAX) 0.5 MG TABLET    Take 1 tablet (0.5 mg total) by mouth 3 (three) times daily as needed for anxiety.   AMINO ACIDS-PROTEIN HYDROLYS (FEEDING SUPPLEMENT, PRO-STAT SUGAR FREE 64,) LIQD    Take 30 mLs by mouth 2 (two) times daily.   DIPHENHYDRAMINE (BENADRYL) 25 MG CAPSULE    Take 1 capsule (25 mg total) by mouth every 8 (eight) hours as needed for itching.   FENTANYL (DURAGESIC - DOSED MCG/HR) 25 MCG/HR PATCH    Place 1 patch (25 mcg total) onto the skin every 3 (three) days.   FERROUS SULFATE 325 (65 FE) MG TABLET    Take 325 mg by mouth daily with breakfast.   FUROSEMIDE (LASIX) 20 MG TABLET    Take 3 tablets (60 mg  total) by mouth 2 (two) times daily.   HYDROCERIN (EUCERIN) CREA    Apply 1 application topically 2 (two) times daily. Apply to her skin, to maintain good moisture level   HYDROXYZINE (ATARAX/VISTARIL) 10 MG TABLET    Take 10 mg by mouth 2 (two) times daily.   LACTULOSE (CHRONULAC) 10 GM/15ML SOLUTION    Take 45 mLs (30 g total) by mouth daily.   MULTIPLE VITAMINS-MINERALS (DECUBI-VITE) CAPS    Take 1 capsule by mouth daily.   OXYCODONE (OXY IR/ROXICODONE) 5 MG IMMEDIATE RELEASE TABLET    Give 5 to 10 mg by mouth every 6 hours as needed for moderate to severe pain related to Alcoholic Cirrhosis of Liver with Ascites   PANTOPRAZOLE (PROTONIX) 40 MG TABLET    Take 1 tablet (40 mg total) by mouth daily.   SPIRONOLACTONE (ALDACTONE) 100 MG TABLET    Take 100 mg by mouth daily.   THIAMINE 100 MG TABLET    Take 1 tablet (100 mg total) by mouth daily.   TRAZODONE (DESYREL) 100 MG TABLET    Take 100 mg by mouth at bedtime.   UNABLE TO FIND    Med Name: Med Pass 240 mL daily  Modified Medications   No medications on file  Discontinued Medications   POTASSIUM CHLORIDE SA (K-DUR,KLOR-CON) 20 MEQ TABLET    Take  1 tablet (20 mEq total) by mouth daily.     SIGNIFICANT DIAGNOSTIC EXAMS   04-19-16: 2-d echo: - Left ventricle: The cavity size was normal. Wall thickness was normal. Systolic function was normal. The estimated ejection fraction was in the range of 60% to 65%. Wall motion was normal;  there were no regional wall motion abnormalities. Doppler parameters are consistent with abnormal left ventricular relaxation (grade 1 diastolic dysfunction).  07-21-16: chest x-ray; Left lower lobe airspace consolidation. Lungs elsewhere clear. Cardiac silhouette within normal limits.  07-22-16: paracentesis: A total of approximately 5 liters of yellow fluid was removed.  07-29-16: paracentesis: A total of approximately 9.4 liters of dark, amber, blood-tinged fluid was removed  08-08-16: EKG: sinus tachycardia   LABS REVIEWED:   07-21-16: wbc 12.5; hgb 8.8; hct 24.2; mcv 84.6; plt 218; glucose 113; bun 36; creat 1.61; k+ 4.8; na++ 129; ast 59; alk phos 155; total bili 2.8; albumin 1.8; ammonia 115; blood culture: no growth 07-24-16: wbc 11.0; hgb 8.5; hct 24.3 ;mcv 85; plt 175; glucose 103; bun 27; creat 1.09; k+ 3.3; na++ 132; ammonia 60 07-27-16: wbc 15.2; hgb 8.9; hct 25.7; mcv 86.5; plt 188; glucose 105; bun 25; creat 0.91; k+ 4.9; na++ 129 07-30-16: wbc 15.4; hgb 9.2; hct 26.4; mcv 86.0; plt 249; glucose 87; bun 22; creat 0.79; k+ 4.2; na++ 125  ast 18; alk phos 121 total bili 1.9; albumin 1.9  08-08-16: wbc 12.2; hgb 8.2; hct 25.4; mcv 94.2; plt 247; glucose 78; bun 70.4; creat 2.20; k+ 6.5; na++ 129;ast 30; alt 11; alk phos 143 total bili 1.4  08-11-16: glucose 110; bun 70.8; creat 1.76; k+ 5.8; na++ 127; ast 37; alt 14; alk phos 134 total bili 1.9 08-25-16: glucose 92; bun 87.4; creat 2.01; k+ 5.6; na++ 128; ca 7.7     Review of Systems  Constitutional: Positive for malaise/fatigue.  Respiratory: Negative for cough and shortness of breath.   Cardiovascular: Negative for chest pain, palpitations and  leg swelling.  Gastrointestinal: Negative for abdominal pain, constipation and heartburn.  Musculoskeletal: Positive for back pain and myalgias. Negative for joint pain.  Skin:  Negative.   Neurological: Negative for dizziness.  Psychiatric/Behavioral: The patient is nervous/anxious.     Physical Exam  Constitutional: She is oriented to person, place, and time. No distress.  Frail malnourished   Eyes: Conjunctivae are normal.  Neck: Neck supple. No JVD present. No thyromegaly present.  Cardiovascular: Normal rate, regular rhythm and intact distal pulses.   Respiratory: Effort normal and breath sounds normal. No respiratory distress. She has no wheezes.  GI: Soft. Bowel sounds are normal. She exhibits distension. There is no tenderness.  Distension due to ascites.   Musculoskeletal: She exhibits no edema.  Able to move all extremities   Lymphadenopathy:    She has no cervical adenopathy.  Neurological: She is alert and oriented to person, place, and time.  Skin: Skin is warm and dry. She is not diaphoretic.  Psychiatric:  More calm today   ASSESSMENT/ PLAN:  1. Alcoholic liver cirrhosis with ascites Will change her oxycodone to 5 mg every 6 hours routinely and every 3 hours as needed  Time spent with patient 30   minutes >50% time spent counseling; reviewing medical record; tests; labs; and developing future plan of care   MD is aware of resident's narcotic use and is in agreement with current plan of care. We will attempt to wean resident as apropriate   Ok Edwards NP [facility]  Contact [phone] Monday through Friday 8am- 5pm  After hours call [phone]

## 2016-09-01 ENCOUNTER — Emergency Department (HOSPITAL_COMMUNITY): Payer: BLUE CROSS/BLUE SHIELD

## 2016-09-01 ENCOUNTER — Encounter (HOSPITAL_COMMUNITY): Payer: Self-pay | Admitting: Emergency Medicine

## 2016-09-01 ENCOUNTER — Inpatient Hospital Stay (HOSPITAL_COMMUNITY)
Admission: EM | Admit: 2016-09-01 | Discharge: 2016-09-11 | DRG: 432 | Disposition: A | Discharge status: Hospice | Payer: BLUE CROSS/BLUE SHIELD | Attending: Internal Medicine | Admitting: Internal Medicine

## 2016-09-01 DIAGNOSIS — Z79891 Long term (current) use of opiate analgesic: Secondary | ICD-10-CM | POA: Diagnosis not present

## 2016-09-01 DIAGNOSIS — Z515 Encounter for palliative care: Secondary | ICD-10-CM | POA: Diagnosis not present

## 2016-09-01 DIAGNOSIS — E43 Unspecified severe protein-calorie malnutrition: Secondary | ICD-10-CM | POA: Diagnosis present

## 2016-09-01 DIAGNOSIS — D638 Anemia in other chronic diseases classified elsewhere: Secondary | ICD-10-CM | POA: Diagnosis present

## 2016-09-01 DIAGNOSIS — R188 Other ascites: Secondary | ICD-10-CM

## 2016-09-01 DIAGNOSIS — T50905A Adverse effect of unspecified drugs, medicaments and biological substances, initial encounter: Secondary | ICD-10-CM

## 2016-09-01 DIAGNOSIS — R571 Hypovolemic shock: Secondary | ICD-10-CM | POA: Diagnosis present

## 2016-09-01 DIAGNOSIS — Z886 Allergy status to analgesic agent status: Secondary | ICD-10-CM | POA: Diagnosis not present

## 2016-09-01 DIAGNOSIS — L89892 Pressure ulcer of other site, stage 2: Secondary | ICD-10-CM | POA: Diagnosis present

## 2016-09-01 DIAGNOSIS — K7040 Alcoholic hepatic failure without coma: Principal | ICD-10-CM | POA: Diagnosis present

## 2016-09-01 DIAGNOSIS — Z6826 Body mass index (BMI) 26.0-26.9, adult: Secondary | ICD-10-CM

## 2016-09-01 DIAGNOSIS — R64 Cachexia: Secondary | ICD-10-CM | POA: Diagnosis present

## 2016-09-01 DIAGNOSIS — L89312 Pressure ulcer of right buttock, stage 2: Secondary | ICD-10-CM | POA: Diagnosis present

## 2016-09-01 DIAGNOSIS — Y9289 Other specified places as the place of occurrence of the external cause: Secondary | ICD-10-CM | POA: Diagnosis not present

## 2016-09-01 DIAGNOSIS — R410 Disorientation, unspecified: Secondary | ICD-10-CM

## 2016-09-01 DIAGNOSIS — E8770 Fluid overload, unspecified: Secondary | ICD-10-CM | POA: Diagnosis present

## 2016-09-01 DIAGNOSIS — R579 Shock, unspecified: Secondary | ICD-10-CM | POA: Diagnosis not present

## 2016-09-01 DIAGNOSIS — K7290 Hepatic failure, unspecified without coma: Secondary | ICD-10-CM

## 2016-09-01 DIAGNOSIS — Z79899 Other long term (current) drug therapy: Secondary | ICD-10-CM | POA: Diagnosis not present

## 2016-09-01 DIAGNOSIS — K7031 Alcoholic cirrhosis of liver with ascites: Secondary | ICD-10-CM

## 2016-09-01 DIAGNOSIS — R339 Retention of urine, unspecified: Secondary | ICD-10-CM | POA: Diagnosis not present

## 2016-09-01 DIAGNOSIS — Z87891 Personal history of nicotine dependence: Secondary | ICD-10-CM

## 2016-09-01 DIAGNOSIS — R140 Abdominal distension (gaseous): Secondary | ICD-10-CM

## 2016-09-01 DIAGNOSIS — N179 Acute kidney failure, unspecified: Secondary | ICD-10-CM | POA: Diagnosis not present

## 2016-09-01 DIAGNOSIS — K219 Gastro-esophageal reflux disease without esophagitis: Secondary | ICD-10-CM | POA: Diagnosis present

## 2016-09-01 DIAGNOSIS — Z7189 Other specified counseling: Secondary | ICD-10-CM

## 2016-09-01 DIAGNOSIS — L89612 Pressure ulcer of right heel, stage 2: Secondary | ICD-10-CM | POA: Diagnosis not present

## 2016-09-01 DIAGNOSIS — E871 Hypo-osmolality and hyponatremia: Secondary | ICD-10-CM | POA: Diagnosis present

## 2016-09-01 DIAGNOSIS — R4182 Altered mental status, unspecified: Secondary | ICD-10-CM | POA: Diagnosis present

## 2016-09-01 DIAGNOSIS — Z882 Allergy status to sulfonamides status: Secondary | ICD-10-CM

## 2016-09-01 DIAGNOSIS — Z66 Do not resuscitate: Secondary | ICD-10-CM | POA: Diagnosis present

## 2016-09-01 DIAGNOSIS — E875 Hyperkalemia: Secondary | ICD-10-CM | POA: Diagnosis not present

## 2016-09-01 DIAGNOSIS — E7220 Disorder of urea cycle metabolism, unspecified: Secondary | ICD-10-CM | POA: Diagnosis present

## 2016-09-01 DIAGNOSIS — F1020 Alcohol dependence, uncomplicated: Secondary | ICD-10-CM | POA: Diagnosis present

## 2016-09-01 DIAGNOSIS — K7682 Hepatic encephalopathy: Secondary | ICD-10-CM

## 2016-09-01 HISTORY — DX: Hyperkalemia: E87.5

## 2016-09-01 HISTORY — DX: Alcoholic cirrhosis of liver without ascites: K70.30

## 2016-09-01 HISTORY — DX: Other ascites: R18.8

## 2016-09-01 HISTORY — DX: Acute kidney failure, unspecified: N17.9

## 2016-09-01 LAB — URINALYSIS, ROUTINE W REFLEX MICROSCOPIC
Bilirubin Urine: NEGATIVE
Glucose, UA: NEGATIVE mg/dL
Hgb urine dipstick: NEGATIVE
Ketones, ur: NEGATIVE mg/dL
LEUKOCYTES UA: NEGATIVE
NITRITE: NEGATIVE
Protein, ur: NEGATIVE mg/dL
Specific Gravity, Urine: 1.01 (ref 1.005–1.030)
pH: 5 (ref 5.0–8.0)

## 2016-09-01 LAB — COMPREHENSIVE METABOLIC PANEL
ALT: 14 U/L (ref 14–54)
ANION GAP: 9 (ref 5–15)
AST: 28 U/L (ref 15–41)
Albumin: 1.2 g/dL — ABNORMAL LOW (ref 3.5–5.0)
Alkaline Phosphatase: 83 U/L (ref 38–126)
BUN: 101 mg/dL — ABNORMAL HIGH (ref 6–20)
CHLORIDE: 107 mmol/L (ref 101–111)
CO2: 15 mmol/L — AB (ref 22–32)
CREATININE: 2.12 mg/dL — AB (ref 0.44–1.00)
Calcium: 7 mg/dL — ABNORMAL LOW (ref 8.9–10.3)
GFR, EST AFRICAN AMERICAN: 29 mL/min — AB (ref >60)
GFR, EST NON AFRICAN AMERICAN: 25 mL/min — AB (ref >60)
Glucose, Bld: 101 mg/dL — ABNORMAL HIGH (ref 65–99)
POTASSIUM: 5.7 mmol/L — AB (ref 3.5–5.1)
SODIUM: 131 mmol/L — AB (ref 135–145)
Total Bilirubin: 1.3 mg/dL — ABNORMAL HIGH (ref 0.3–1.2)
Total Protein: 5 g/dL — ABNORMAL LOW (ref 6.5–8.1)

## 2016-09-01 LAB — CBC WITH DIFFERENTIAL/PLATELET
Basophils Absolute: 0 10*3/uL (ref 0.0–0.1)
Basophils Relative: 0 %
EOS ABS: 0.6 10*3/uL (ref 0.0–0.7)
EOS PCT: 7 %
HCT: 22.9 % — ABNORMAL LOW (ref 36.0–46.0)
Hemoglobin: 8 g/dL — ABNORMAL LOW (ref 12.0–15.0)
LYMPHS ABS: 1.4 10*3/uL (ref 0.7–4.0)
Lymphocytes Relative: 15 %
MCH: 29.6 pg (ref 26.0–34.0)
MCHC: 34.9 g/dL (ref 30.0–36.0)
MCV: 84.8 fL (ref 78.0–100.0)
MONO ABS: 0.6 10*3/uL (ref 0.1–1.0)
Monocytes Relative: 7 %
Neutro Abs: 6.4 10*3/uL (ref 1.7–7.7)
Neutrophils Relative %: 71 %
PLATELETS: 183 10*3/uL (ref 150–400)
RBC: 2.7 MIL/uL — AB (ref 3.87–5.11)
RDW: 16.3 % — AB (ref 11.5–15.5)
WBC: 9 10*3/uL (ref 4.0–10.5)

## 2016-09-01 LAB — CBG MONITORING, ED: Glucose-Capillary: 99 mg/dL (ref 65–99)

## 2016-09-01 LAB — I-STAT VENOUS BLOOD GAS, ED
Acid-base deficit: 12 mmol/L — ABNORMAL HIGH (ref 0.0–2.0)
Bicarbonate: 13.7 mmol/L — ABNORMAL LOW (ref 20.0–28.0)
O2 SAT: 93 %
PCO2 VEN: 28 mmHg — AB (ref 44.0–60.0)
PH VEN: 7.297 (ref 7.250–7.430)
PO2 VEN: 73 mmHg — AB (ref 32.0–45.0)
TCO2: 15 mmol/L (ref 0–100)

## 2016-09-01 LAB — LACTIC ACID, PLASMA: LACTIC ACID, VENOUS: 1.5 mmol/L (ref 0.5–1.9)

## 2016-09-01 LAB — AMMONIA: Ammonia: 150 umol/L — ABNORMAL HIGH (ref 9–35)

## 2016-09-01 MED ORDER — NALOXONE HCL 0.4 MG/ML IJ SOLN
0.4000 mg | Freq: Once | INTRAMUSCULAR | Status: AC
  Administered 2016-09-01: 0.4 mg via INTRAVENOUS
  Filled 2016-09-01: qty 1

## 2016-09-01 MED ORDER — SODIUM CHLORIDE 0.9 % IV SOLN
INTRAVENOUS | Status: DC
Start: 2016-09-01 — End: 2016-09-05
  Administered 2016-09-01 – 2016-09-03 (×3): via INTRAVENOUS

## 2016-09-01 MED ORDER — SODIUM CHLORIDE 0.9% FLUSH
3.0000 mL | Freq: Two times a day (BID) | INTRAVENOUS | Status: DC
  Administered 2016-09-02 – 2016-09-10 (×16): 3 mL via INTRAVENOUS

## 2016-09-01 MED ORDER — MORPHINE SULFATE (PF) 4 MG/ML IV SOLN
2.0000 mg | INTRAVENOUS | Status: DC | PRN
  Administered 2016-09-02 (×2): 2 mg via INTRAVENOUS
  Filled 2016-09-01 (×2): qty 1

## 2016-09-01 MED ORDER — PIPERACILLIN-TAZOBACTAM 3.375 G IVPB 30 MIN
3.3750 g | Freq: Once | INTRAVENOUS | Status: AC
  Administered 2016-09-01: 3.375 g via INTRAVENOUS
  Filled 2016-09-01: qty 50

## 2016-09-01 MED ORDER — SODIUM CHLORIDE 0.9 % IV BOLUS (SEPSIS)
1000.0000 mL | Freq: Once | INTRAVENOUS | Status: AC
  Administered 2016-09-01: 1000 mL via INTRAVENOUS

## 2016-09-01 MED ORDER — LACTULOSE 10 GM/15ML PO SOLN
30.0000 g | Freq: Once | ORAL | Status: DC
  Filled 2016-09-01: qty 45

## 2016-09-01 MED ORDER — VANCOMYCIN HCL IN DEXTROSE 1-5 GM/200ML-% IV SOLN
1000.0000 mg | Freq: Once | INTRAVENOUS | Status: AC
  Administered 2016-09-01: 1000 mg via INTRAVENOUS
  Filled 2016-09-01: qty 200

## 2016-09-01 MED ORDER — PIPERACILLIN-TAZOBACTAM 3.375 G IVPB
3.3750 g | Freq: Three times a day (TID) | INTRAVENOUS | Status: DC
  Administered 2016-09-02 – 2016-09-03 (×5): 3.375 g via INTRAVENOUS
  Filled 2016-09-01 (×6): qty 50

## 2016-09-01 MED ORDER — LORAZEPAM 2 MG/ML IJ SOLN
1.0000 mg | INTRAMUSCULAR | Status: DC | PRN
  Administered 2016-09-02 – 2016-09-04 (×8): 1 mg via INTRAVENOUS
  Filled 2016-09-01 (×8): qty 1

## 2016-09-01 MED ORDER — NALOXONE HCL 0.4 MG/ML IJ SOLN
0.4000 mg | INTRAMUSCULAR | Status: DC | PRN
  Administered 2016-09-01: 0.4 mg via INTRAVENOUS

## 2016-09-01 MED ORDER — SODIUM CHLORIDE 0.9 % IV BOLUS (SEPSIS)
500.0000 mL | Freq: Once | INTRAVENOUS | Status: AC
  Administered 2016-09-02: 500 mL via INTRAVENOUS

## 2016-09-01 MED ORDER — NALOXONE HCL 0.4 MG/ML IJ SOLN
INTRAMUSCULAR | Status: AC
  Filled 2016-09-01: qty 1

## 2016-09-01 MED ORDER — ALBUMIN HUMAN 25 % IV SOLN
50.0000 g | Freq: Once | INTRAVENOUS | Status: AC
  Administered 2016-09-01: 50 g via INTRAVENOUS
  Filled 2016-09-01: qty 200

## 2016-09-01 MED ORDER — VANCOMYCIN HCL IN DEXTROSE 750-5 MG/150ML-% IV SOLN
750.0000 mg | INTRAVENOUS | Status: DC
  Administered 2016-09-02: 750 mg via INTRAVENOUS
  Filled 2016-09-01 (×2): qty 150

## 2016-09-01 MED ORDER — SODIUM CHLORIDE 0.9 % IV BOLUS (SEPSIS)
1000.0000 mL | Freq: Once | INTRAVENOUS | Status: AC
  Administered 2016-09-02: 1000 mL via INTRAVENOUS

## 2016-09-01 NOTE — ED Provider Notes (Signed)
[facility]-EMERGENCY DEPT Provider Note  CSN: [id number] Arrival date & time: 09/01/16  1911  History   Chief Complaint Chief Complaint  Patient presents with  . Altered Mental Status   HPI Christine [name] is a 56 y.o. female.  The history is provided by the EMS personnel, a relative and medical records. The history is limited by the condition of the patient. No language interpreter was used.  Illness  This is a new problem. The current episode started 12 to 24 hours ago. The problem occurs constantly. The problem has been gradually worsening. Associated symptoms include shortness of breath. Nothing aggravates the symptoms. Nothing relieves the symptoms.   Past Medical History:  Diagnosis Date  . Abnormal Pap smear    displasia had colposcopy  . Anemia   . Arthritis   . Closed right humeral fracture    Humeral head fracture  . Depression    not currently on meds  . GERD (gastroesophageal reflux disease)   . Gout   . Hernia   . Hypertension   . Psoriasis    Patient Active Problem List   Diagnosis Date Noted  . Hepatic encephalopathy (HCC) 09/01/2016  . SBP (spontaneous bacterial peritonitis) (HCC) 08/21/2016  . Depression 07/31/2016  . Abdominal distention   . Ascites due to alcoholic cirrhosis (HCC)   . Weakness   . Protein-calorie malnutrition, severe 07/24/2016  . Pressure injury of skin 07/21/2016  . Scoliosis 04/29/2016  . History of gout 04/29/2016  . Psoriasis 04/29/2016  . Folic acid deficiency 04/24/2016  . Obesity 04/24/2016  . Hypokalemia 04/19/2016  . Alcohol abuse 04/19/2016  . Abnormal liver function 04/19/2016  . Acute alcohol intoxication (HCC) 04/19/2016  . Alcoholic cirrhosis of liver with ascites (HCC) 04/19/2016  . Anemia of chronic disease 04/19/2016  . Hyperammonemia (HCC) 04/19/2016  . Troponin level elevated 04/19/2016  . Lactic acidosis 04/19/2016   Past Surgical History:  Procedure Laterality Date  . COLPOSCOPY    . DILATION AND CURETTAGE  OF UTERUS     OB History    Gravida Para Term Preterm AB Living   1       1 0   SAB TAB Ectopic Multiple Live Births                 Home Medications    Prior to Admission medications   Medication Sig Start Date End Date Taking? Authorizing Provider  acetaminophen (TYLENOL) 325 MG tablet Take 650 mg by mouth every 6 (six) hours as needed for mild pain.   Yes Historical Provider, MD  CALCIUM ALGINATE EX Apply 1 application topically See admin instructions. Apply to buttocks topically every day shift for wound care/ clean area with wound cleanser, apply calcium alginate and cover with dry dressing   Yes Historical Provider, MD  diphenhydrAMINE (BENADRYL) 25 mg capsule Take 1 capsule (25 mg total) by mouth every 8 (eight) hours as needed for itching. 07/30/16  Yes Vassie Loll, MD  fentaNYL (DURAGESIC - DOSED MCG/HR) 25 MCG/HR patch Place 1 patch (25 mcg total) onto the skin every 3 (three) days. 08/01/16  Yes Tiffany L Reed, DO  ferrous sulfate 325 (65 FE) MG tablet Take 325 mg by mouth daily with breakfast.   Yes Historical Provider, MD  furosemide (LASIX) 20 MG tablet Take 3 tablets (60 mg total) by mouth 2 (two) times daily. 07/30/16  Yes Vassie Loll, MD  hydrocerin (EUCERIN) CREA Apply 1 application topically 2 (two) times daily. Apply to her skin,  to maintain good moisture level 07/30/16  Yes Vassie Lollarlos Madera, MD  hydrOXYzine (ATARAX/VISTARIL) 10 MG tablet Take 10 mg by mouth 2 (two) times daily.   Yes Historical Provider, MD  lactulose (CHRONULAC) 10 GM/15ML solution Take 45 mLs (30 g total) by mouth daily. 07/30/16  Yes Vassie Lollarlos Madera, MD  Multiple Vitamins-Minerals (DECUBI-VITE) CAPS Take 1 capsule by mouth daily.   Yes Historical Provider, MD  Nutritional Supplements (NUTRITIONAL SUPPLEMENT PO) Take 60 mLs by mouth 2 (two) times daily. Med pass - house 2.0   Yes Historical Provider, MD  oxyCODONE (OXY IR/ROXICODONE) 5 MG immediate release tablet Take 5 mg by mouth every 3 (three) hours  as needed for moderate pain or severe pain. related to Alcoholic Cirrhosis of Liver with Ascites 07/18/16  Yes Historical Provider, MD  pantoprazole (PROTONIX) 40 MG tablet Take 1 tablet (40 mg total) by mouth daily. Patient taking differently: Take 40 mg by mouth daily at 6 PM.  07/31/16  Yes Vassie Lollarlos Madera, MD  PROTEIN PO Take 30 mLs by mouth 2 (two) times daily.   Yes Historical Provider, MD  spironolactone (ALDACTONE) 100 MG tablet Take 100 mg by mouth daily.   Yes Historical Provider, MD  thiamine 100 MG tablet Take 1 tablet (100 mg total) by mouth daily. 04/25/16  Yes Calvert CantorSaima Rizwan, MD  traZODone (DESYREL) 100 MG tablet Take 100 mg by mouth at bedtime.   Yes Historical Provider, MD  ALPRAZolam Prudy Feeler(XANAX) 0.5 MG tablet Take 1 tablet (0.5 mg total) by mouth 3 (three) times daily as needed for anxiety. Patient not taking: Reported on 09/01/2016 07/31/16   Kermit Baloiffany L Reed, DO   Family History Family History  Problem Relation Age of Onset  . Hypertension Mother   . Diabetes Mother   . Hypertension Father   . Diabetes Brother    Social History Social History  Substance Use Topics  . Smoking status: Former Smoker    Packs/day: 1.00    Years: 15.00    Types: Cigarettes    Quit date: 04/18/2016  . Smokeless tobacco: Never Used     Comment: Admitted to the hospital 04/18/16  . Alcohol use No     Comment: couple glasses liquor day   Allergies   Ibuprofen and Sulfa antibiotics  Review of Systems Review of Systems  Unable to perform ROS: Mental status change  Respiratory: Positive for shortness of breath.   Cardiovascular: Positive for leg swelling.  Gastrointestinal: Positive for abdominal distention.   Physical Exam Updated Vital Signs BP (!) 71/43   Pulse 69   Temp (!) 95.6 F (35.3 C) (Rectal)   Resp 10   SpO2 96%   Physical Exam  Constitutional: No distress.  Middle-age chronically ill-appearing Caucasian female  HENT:  Head: Normocephalic and atraumatic.  Eyes: Pupils are  equal, round, and reactive to light. Scleral icterus is present.  Neck: Neck supple.  Cardiovascular: Normal rate, regular rhythm and normal heart sounds.   Hypotensive  Pulmonary/Chest: She has no wheezes. She has no rales.  Bradypnea with decreased breath sounds at bases, maintaining saturations on nasal cannula  Abdominal: Soft. Bowel sounds are normal. She exhibits distension.  Musculoskeletal: She exhibits no deformity.  Neurological: She is alert.  Eyes open, moaning but no comprehensible speech, moving all extremities but not to command  Skin: Skin is dry. Capillary refill takes 2 to 3 seconds. She is not diaphoretic. There is pallor.  Jaundiced, pressure ulcers to bilateral lower extremities with overlying dressings are clean dry  and intact  Nursing note and vitals reviewed.  ED Treatments / Results  Labs (all labs ordered are listed, but only abnormal results are displayed) Labs Reviewed  CBC WITH DIFFERENTIAL/PLATELET - Abnormal; Notable for the following:       Result Value   RBC 2.70 (*)    Hemoglobin 8.0 (*)    HCT 22.9 (*)    RDW 16.3 (*)    All other components within normal limits  COMPREHENSIVE METABOLIC PANEL - Abnormal; Notable for the following:    Sodium 131 (*)    Potassium 5.7 (*)    CO2 15 (*)    Glucose, Bld 101 (*)    BUN 101 (*)    Creatinine, Ser 2.12 (*)    Calcium 7.0 (*)    Total Protein 5.0 (*)    Albumin 1.2 (*)    Total Bilirubin 1.3 (*)    GFR calc non Af Amer 25 (*)    GFR calc Af Amer 29 (*)    All other components within normal limits  AMMONIA - Abnormal; Notable for the following:    Ammonia 150 (*)    All other components within normal limits  I-STAT VENOUS BLOOD GAS, ED - Abnormal; Notable for the following:    pCO2, Ven 28.0 (*)    pO2, Ven 73.0 (*)    Bicarbonate 13.7 (*)    Acid-base deficit 12.0 (*)    All other components within normal limits  CULTURE, BLOOD (ROUTINE X 2)  CULTURE, BLOOD (ROUTINE X 2)  URINALYSIS, ROUTINE  W REFLEX MICROSCOPIC  BLOOD GAS, VENOUS  CBG MONITORING, ED   EKG  EKG Interpretation None      Radiology Dg Chest Portable 1 View  Result Date: 09/01/2016 CLINICAL DATA:  Altered mental status. Abdominal distention. Bradypnea. EXAM: PORTABLE CHEST 1 VIEW COMPARISON:  Chest radiographs 07/21/2016 FINDINGS: Low lung volumes. Patient's hand partially obscures evaluation of the left lung base. Improved left basilar aeration with probable residual small volume pleural fluid. Streaky bibasilar opacities likely atelectasis. Normal heart size. Unchanged mediastinal contours. No pneumothorax. No acute osseous abnormality. IMPRESSION: Low lung volumes with small left pleural effusion, decreased in size from radiographs 6 weeks prior. Streaky bibasilar atelectasis. Electronically Signed   By: Rubye Oaks M.D.   On: 09/01/2016 21:20   Procedures Procedures (including critical care time)  Medications Ordered in ED Medications  naloxone [facility]) injection 0.4 mg (0.4 mg Intravenous Given 09/01/16 2002)    Initial Impression / Assessment and Plan / ED Course  I have reviewed the triage vital signs and the nursing notes.  56 y.o. female with above stated PMHx, HPI, and physical. PMHx of chronic pain, depression, EtOH abuse, alcoholic cirrhosis with failure (refractory ascites undergoing paracenteses every 2 weeks). Patient more altered over past 24 hours per nursing facility and husband.  Patient brought up neck and borderline hypotensive on exam. Given Narcan with improvement in mental status as well as blood pressure and respiratory rate. Given the worsening abdominal distention and history of hepatic encephalopathy as well as elevated ammonia level - will needed to titrate lactulose regimen. Blood cultures drawn and patient placed on broad spectrum abx. IVF's given.  Laboratory and imaging results were personally reviewed by myself and used in the medical decision making of this patient's  treatment and disposition.  Pt admitted to step down for further evaluation and management of AMS with suspicion for medication side effect and hepatic encephalopathy with hyperammonemia. Pt understands and agrees with the  plan and has no further questions or concerns.   Pt care discussed with and followed by my attending, Dr. Benjiman Core  Angelina Ok, MD Pager [phone]  Final Clinical Impressions(s) / ED Diagnoses   Final diagnoses:  Hepatic encephalopathy (HCC)  Confusion  Adverse effect of drug, initial encounter  Abdominal distension  Ascites due to alcoholic cirrhosis (HCC)  Hyperammonemia (HCC)   New Prescriptions New Prescriptions   No medications on file     Angelina Ok, MD 09/01/16 2237    Benjiman Core, MD 09/01/16 2344

## 2016-09-01 NOTE — ED Notes (Signed)
NS hung, per verbal order EDP.

## 2016-09-01 NOTE — ED Notes (Signed)
Bair hugger applied.

## 2016-09-01 NOTE — ED Triage Notes (Signed)
Per EMS, pt from SNF with c/o AMS. Last seen normal 08/31/16 by husband. Pt responsive to pain at this time. Pt being bagged on arrrival, per EMS episode of apnea/agonal breathing. BP-89/43, HR-20s-60, CBG-120, SpO2-100%ra

## 2016-09-01 NOTE — H&P (Signed)
History and Physical  Patient Name: Christine [name]     ZOX:[id number]    DOB: 1960/12/26    DOA: 09/01/2016 PCP: [name], MD   Patient coming from: SNF  Chief Complaint: Altered mental status  HPI: Christine BROTHERTON is a 56 y.o. female with a past medical history significant for decompensated alcoholic cirrhosis c/b HE and ascites who presents with altered mental status and lethargy.  The patient was diagnosed with cirrhosis last Sept, has been followed by Dr. [name] since then.  She was readmitted with ascites in Dec, required repeat paracentesis, and she was also treated at that time empiricially for SBP (cell counts were normal) as well as pneumonia.  She was discharged to SNF, where she has been since 12/28.  Her last paracentesis was 4 days ago.  Husband says yesterday and today she seemed very weak and her lips were dry.  This morning, Speech Therapy at her facility were planning to do a direct vis of vocal cords, but husband thought she was too weak for the procedure, but it happened anyway.  Then, in the afternoon, staff at her facility felt she was less responsive than usual, eventually seemed lethargic and her BP was soft and so EMS was called.  En route, her BP was 80s/40s and she had an episode of agonal breathing and so she got bag ventilation briefly.  ED course: -Hypothermic, heart rate 75, respirations 7 to 15, BP 70s and 80s systolic, SpO2 normal on supplemental O2 -Na 131, K 5.7, Cr 2.12 (baseline 0.8 last Jan, has been 1.5-2 at the NH since then), WBC 9K, Hgb 8 -Ammonia 150 -Albumin 1.2 -She was given fluids and TRH were asked to evaluate for hepatic encephalopathy and suspected opiate overdose     ROS: Review of Systems  Unable to perform ROS: Critical illness          Past Medical History:  Diagnosis Date  . Abnormal Pap smear    displasia had colposcopy  . Anemia   . Arthritis   . Closed right humeral fracture    Humeral head fracture  . Depression    not  currently on meds  . GERD (gastroesophageal reflux disease)   . Gout   . Hernia   . Hypertension   . Psoriasis     Past Surgical History:  Procedure Laterality Date  . COLPOSCOPY    . DILATION AND CURETTAGE OF UTERUS      Social History: Patient lives with her husband, currently from SNF.  The patient has not been able to walk for >2 months.  Former smoker.    Allergies  Allergen Reactions  . Ibuprofen Other (See Comments)    Pt is unable to take due to her hernia.    . Sulfa Antibiotics Nausea Only and Rash    Family history: family history includes Diabetes in her brother and mother; Hypertension in her father and mother.  Prior to Admission medications   Medication Sig Start Date End Date Taking? Authorizing Provider  acetaminophen (TYLENOL) 325 MG tablet Take 650 mg by mouth every 6 (six) hours as needed for mild pain.   Yes Historical Provider, MD  CALCIUM ALGINATE EX Apply 1 application topically See admin instructions. Apply to buttocks topically every day shift for wound care/ clean area with wound cleanser, apply calcium alginate and cover with dry dressing   Yes Historical Provider, MD  diphenhydrAMINE (BENADRYL) 25 mg capsule Take 1 capsule (25 mg total) by mouth every 8 (  eight) hours as needed for itching. 07/30/16  Yes Vassie Loll, MD  fentaNYL (DURAGESIC - DOSED MCG/HR) 25 MCG/HR patch Place 1 patch (25 mcg total) onto the skin every 3 (three) days. 08/01/16  Yes Tiffany L Reed, DO  ferrous sulfate 325 (65 FE) MG tablet Take 325 mg by mouth daily with breakfast.   Yes Historical Provider, MD  furosemide (LASIX) 20 MG tablet Take 3 tablets (60 mg total) by mouth 2 (two) times daily. 07/30/16  Yes Vassie Loll, MD  hydrocerin (EUCERIN) CREA Apply 1 application topically 2 (two) times daily. Apply to her skin, to maintain good moisture level 07/30/16  Yes Vassie Loll, MD  hydrOXYzine (ATARAX/VISTARIL) 10 MG tablet Take 10 mg by mouth 2 (two) times daily.   Yes  Historical Provider, MD  lactulose (CHRONULAC) 10 GM/15ML solution Take 45 mLs (30 g total) by mouth daily. 07/30/16  Yes Vassie Loll, MD  Multiple Vitamins-Minerals (DECUBI-VITE) CAPS Take 1 capsule by mouth daily.   Yes Historical Provider, MD  Nutritional Supplements (NUTRITIONAL SUPPLEMENT PO) Take 60 mLs by mouth 2 (two) times daily. Med pass - house 2.0   Yes Historical Provider, MD  oxyCODONE (OXY IR/ROXICODONE) 5 MG immediate release tablet Take 5 mg by mouth every 3 (three) hours as needed for moderate pain or severe pain. related to Alcoholic Cirrhosis of Liver with Ascites 07/18/16  Yes Historical Provider, MD  pantoprazole (PROTONIX) 40 MG tablet Take 1 tablet (40 mg total) by mouth daily. Patient taking differently: Take 40 mg by mouth daily at 6 PM.  07/31/16  Yes Vassie Loll, MD  PROTEIN PO Take 30 mLs by mouth 2 (two) times daily.   Yes Historical Provider, MD  spironolactone (ALDACTONE) 100 MG tablet Take 100 mg by mouth daily.   Yes Historical Provider, MD  thiamine 100 MG tablet Take 1 tablet (100 mg total) by mouth daily. 04/25/16  Yes Calvert Cantor, MD  traZODone (DESYREL) 100 MG tablet Take 100 mg by mouth at bedtime.   Yes Historical Provider, MD       Physical Exam: BP (!) 71/43   Pulse 69   Temp (!) 95.6 F (35.3 C) (Rectal)   Resp 10   SpO2 96%  General appearance: Cachectic adult female, lethargic, groans occasionally to questions.   Eyes: icteric, conjunctiva pink, lids and lashes normal. PERRL.    ENT: No nasal deformity, discharge, epistaxis.  Hearing unable to test. OP dry without lesions.   Neck: No neck masses.  Trachea midline.  No thyromegaly/tenderness. Lymph: No cervical or supraclavicular lymphadenopathy. Skin: Warm and dry.  Skin wan and grey.  No suspicious rashes or lesions. Cardiac: Tachycardic, nl S1-S2, no murmurs appreciated.  No LE edema.  Radial pulses 2+ and symmetric.  DP pulses diminished. Respiratory: Respirations irregular and  shallow.  No rales or wheezes appreciated. Abdomen: Abdomen soft.  Marked ascites.  No firmness or tenderness appreciated. MSK: No deformities or effusions.  No cyanosis or clubbing. Neuro: PERRL.  Does not follow commands consistently.  Only occasionally able to respond to questions.  Mostly can only groan to questions.  Makes eye contact.   Psych: Unable to assess.     Labs on Admission:  I have personally reviewed following labs and imaging studies: CBC:  Recent Labs Lab 09/01/16 2004  WBC 9.0  NEUTROABS 6.4  HGB 8.0*  HCT 22.9*  MCV 84.8  PLT 183   Basic Metabolic Panel:  Recent Labs Lab 09/01/16 2004  NA 131*  K  5.7*  CL 107  CO2 15*  GLUCOSE 101*  BUN 101*  CREATININE 2.12*  CALCIUM 7.0*   GFR: Estimated Creatinine Clearance: 29.9 mL/min (by C-G formula based on SCr of 2.12 mg/dL (H)).  Liver Function Tests:  Recent Labs Lab 09/01/16 2004  AST 28  ALT 14  ALKPHOS 83  BILITOT 1.3*  PROT 5.0*  ALBUMIN 1.2*   No results for input(s): LIPASE, AMYLASE in the last 168 hours.  Recent Labs Lab 09/01/16 2004  AMMONIA 150*   Coagulation Profile: No results for input(s): INR, PROTIME in the last 168 hours. Cardiac Enzymes: No results for input(s): CKTOTAL, CKMB, CKMBINDEX, TROPONINI in the last 168 hours. BNP (last 3 results) No results for input(s): PROBNP in the last 8760 hours. HbA1C: No results for input(s): HGBA1C in the last 72 hours. CBG:  Recent Labs Lab 09/01/16 1941  GLUCAP 99   Lipid Profile: No results for input(s): CHOL, HDL, LDLCALC, TRIG, CHOLHDL, LDLDIRECT in the last 72 hours. Thyroid Function Tests: No results for input(s): TSH, T4TOTAL, FREET4, T3FREE, THYROIDAB in the last 72 hours. Anemia Panel: No results for input(s): VITAMINB12, FOLATE, FERRITIN, TIBC, IRON, RETICCTPCT in the last 72 hours. Sepsis Labs: Lactic acid 1.5 Invalid input(s): PROCALCITONIN, LACTICIDVEN No results found for this or any previous visit (from  the past 240 hour(s)).       Radiological Exams on Admission: Personally reviewed CXR shows no focal airspace disease: Dg Chest Portable 1 View  Result Date: 09/01/2016 CLINICAL DATA:  Altered mental status. Abdominal distention. Bradypnea. EXAM: PORTABLE CHEST 1 VIEW COMPARISON:  Chest radiographs 07/21/2016 FINDINGS: Low lung volumes. Patient's hand partially obscures evaluation of the left lung base. Improved left basilar aeration with probable residual small volume pleural fluid. Streaky bibasilar opacities likely atelectasis. Normal heart size. Unchanged mediastinal contours. No pneumothorax. No acute osseous abnormality. IMPRESSION: Low lung volumes with small left pleural effusion, decreased in size from radiographs 6 weeks prior. Streaky bibasilar atelectasis. Electronically Signed   By: Rubye OaksMelanie  Ehinger M.D.   On: 09/01/2016 21:20    EKG: Independently reviewed. Rate 65, QTc 517.  No ST changes.   Echocardiogram Sep 2012: EF 60-65% Grade I DD    Assessment/Plan  1. Shock:  Unclear cause.  Differential includes sepsis, hypovolemia in context of end stage liver disease.   -Aggressive IV fluids -Vancomycin and piperacilin-tazobactam -Albumin 50g given -Consult to palliative Care   2. Alcoholic cirrhosis with ascites and hepatic encephalopathy:  -If stabilizes and can take PO, will restart lactulose -If stabilizes but can't take PO, may need NG tube  3. Acute kidney injury:  Poor prognosis. -Albumin and fluids and trend BMP         DVT prophylaxis: SCDs  Code Status: DO NOT RESUSCITATE  Family Communication: Husband at bedside, CODE STATUS confirmed.  Discussed overall serious prognosis.  Disposition Plan: Anticipate IV fluids and antibiotics and if no improvement over the next 6-12 hours, possible consideration of withdrawal of care. Consults called: None overnight Admission status: INPATIENT       Medical decision making: Patient seen at 10:30 PM on  09/01/2016.  The patient was discussed with Dr. Joaquim LaiFranasiak.  What exists of the patient's chart was reviewed in depth and summarized above.  Clinical condition: critical, worsening, high chance of death during this hospitalization, husband has agreed to fluids/antibiotics, but no escalation of care.        Alberteen SamChristopher P Jarek Longton [facility] Pager [phone]      At the time  of admission, it appears that the appropriate admission status for this patient is INPATIENT. This is judged to be reasonable and necessary in order to provide the required intensity of service to ensure the patient's safety given the presenting symptoms, physical exam findings, and initial radiographic and laboratory data in the context of their chronic comorbidities.  Together, these circumstances are felt to place her at high risk for further clinical deterioration threatening life, limb, or organ.   Patient requires inpatient status due to high intensity of service, high risk for further deterioration and high frequency of surveillance required because of this acute illness that poses a threat to life, limb or bodily function.  I certify that at the point of admission it is my clinical judgment that the patient will require inpatient hospital care spanning beyond 2 midnights from the point of admission and that early discharge would result in unnecessary risk of decompensation and readmission or threat to life, limb or bodily function.

## 2016-09-01 NOTE — Progress Notes (Signed)
Pharmacy Antibiotic Note  Christine [name] is a 56 y.o. female admitted on 09/01/2016 with sepsis Pharmacy has been consulted for vancomycin and zosyn dosing. Acute renal failure noted with sCr 2.12 and CrCl ~ 5930ml/min.  Vancomycin trough goal 15-20  Plan: 1) Vancomycin 1g IV x 1 then 750mg  IV q24 2) Zosyn 3.375g IV q8 ( EI) 3) Follow renal function, cultures, LOT, level if needed     Temp (24hrs), Avg:95.7 F (35.4 C), Min:95.6 F (35.3 C), Max:95.8 F (35.4 C)   Recent Labs Lab 09/01/16 2004  WBC 9.0  CREATININE 2.12*    Estimated Creatinine Clearance: 29.9 mL/min (by C-G formula based on SCr of 2.12 mg/dL (H)).    Allergies  Allergen Reactions  . Ibuprofen Other (See Comments)    Pt is unable to take due to her hernia.    . Sulfa Antibiotics Nausea Only and Rash    Antimicrobials this admission: 1/29 Vancomycin >> 1/29 Zosyn >>  Dose adjustments this admission: n/a  Microbiology results: 1/29 blood x2>>  Thank you for allowing pharmacy to be a part of this patient's care.  Fredrik RiggerMarkle, Dylin Ihnen Sue 09/01/2016 10:41 PM

## 2016-09-02 ENCOUNTER — Inpatient Hospital Stay (HOSPITAL_COMMUNITY): Payer: BLUE CROSS/BLUE SHIELD

## 2016-09-02 DIAGNOSIS — Z515 Encounter for palliative care: Secondary | ICD-10-CM

## 2016-09-02 DIAGNOSIS — Z7189 Other specified counseling: Secondary | ICD-10-CM

## 2016-09-02 LAB — COMPREHENSIVE METABOLIC PANEL
ALK PHOS: 65 U/L (ref 38–126)
ALT: 12 U/L — ABNORMAL LOW (ref 14–54)
ANION GAP: 7 (ref 5–15)
AST: 25 U/L (ref 15–41)
Albumin: 1.8 g/dL — ABNORMAL LOW (ref 3.5–5.0)
BILIRUBIN TOTAL: 1.4 mg/dL — AB (ref 0.3–1.2)
BUN: 96 mg/dL — ABNORMAL HIGH (ref 6–20)
CALCIUM: 7 mg/dL — AB (ref 8.9–10.3)
CO2: 13 mmol/L — ABNORMAL LOW (ref 22–32)
Chloride: 110 mmol/L (ref 101–111)
Creatinine, Ser: 2.15 mg/dL — ABNORMAL HIGH (ref 0.44–1.00)
GFR calc non Af Amer: 24 mL/min — ABNORMAL LOW (ref >60)
GFR, EST AFRICAN AMERICAN: 28 mL/min — AB (ref >60)
GLUCOSE: 95 mg/dL (ref 65–99)
Potassium: 5.4 mmol/L — ABNORMAL HIGH (ref 3.5–5.1)
Sodium: 130 mmol/L — ABNORMAL LOW (ref 135–145)
TOTAL PROTEIN: 5.1 g/dL — AB (ref 6.5–8.1)

## 2016-09-02 LAB — MRSA PCR SCREENING: MRSA by PCR: POSITIVE — AB

## 2016-09-02 LAB — CBC
HCT: 21.5 % — ABNORMAL LOW (ref 36.0–46.0)
HEMOGLOBIN: 7.5 g/dL — AB (ref 12.0–15.0)
MCH: 29.5 pg (ref 26.0–34.0)
MCHC: 34.9 g/dL (ref 30.0–36.0)
MCV: 84.6 fL (ref 78.0–100.0)
Platelets: 169 10*3/uL (ref 150–400)
RBC: 2.54 MIL/uL — ABNORMAL LOW (ref 3.87–5.11)
RDW: 16.2 % — ABNORMAL HIGH (ref 11.5–15.5)
WBC: 7.1 10*3/uL (ref 4.0–10.5)

## 2016-09-02 LAB — BODY FLUID CELL COUNT WITH DIFFERENTIAL
Eos, Fluid: 0 %
Lymphs, Fluid: 45 %
Monocyte-Macrophage-Serous Fluid: 41 % — ABNORMAL LOW (ref 50–90)
Neutrophil Count, Fluid: 14 % (ref 0–25)
Total Nucleated Cell Count, Fluid: 31 cu mm (ref 0–1000)

## 2016-09-02 LAB — BLOOD CULTURE ID PANEL (REFLEXED)
Acinetobacter baumannii: NOT DETECTED
CANDIDA ALBICANS: NOT DETECTED
CANDIDA GLABRATA: NOT DETECTED
CANDIDA KRUSEI: NOT DETECTED
Candida parapsilosis: NOT DETECTED
Candida tropicalis: NOT DETECTED
ENTEROBACTER CLOACAE COMPLEX: NOT DETECTED
ENTEROBACTERIACEAE SPECIES: NOT DETECTED
ESCHERICHIA COLI: NOT DETECTED
Enterococcus species: NOT DETECTED
Haemophilus influenzae: NOT DETECTED
KLEBSIELLA OXYTOCA: NOT DETECTED
Klebsiella pneumoniae: NOT DETECTED
Listeria monocytogenes: NOT DETECTED
Methicillin resistance: DETECTED — AB
Neisseria meningitidis: NOT DETECTED
PSEUDOMONAS AERUGINOSA: NOT DETECTED
Proteus species: NOT DETECTED
STREPTOCOCCUS PNEUMONIAE: NOT DETECTED
STREPTOCOCCUS PYOGENES: NOT DETECTED
Serratia marcescens: NOT DETECTED
Staphylococcus aureus (BCID): NOT DETECTED
Staphylococcus species: DETECTED — AB
Streptococcus agalactiae: NOT DETECTED
Streptococcus species: NOT DETECTED

## 2016-09-02 LAB — GRAM STAIN

## 2016-09-02 LAB — GLUCOSE, CAPILLARY: Glucose-Capillary: 83 mg/dL (ref 65–99)

## 2016-09-02 LAB — LACTIC ACID, PLASMA: LACTIC ACID, VENOUS: 1.2 mmol/L (ref 0.5–1.9)

## 2016-09-02 MED ORDER — CHLORHEXIDINE GLUCONATE CLOTH 2 % EX PADS
6.0000 | MEDICATED_PAD | Freq: Every day | CUTANEOUS | Status: AC
  Administered 2016-09-02 – 2016-09-07 (×5): 6 via TOPICAL

## 2016-09-02 MED ORDER — MUPIROCIN 2 % EX OINT
1.0000 "application " | TOPICAL_OINTMENT | Freq: Two times a day (BID) | CUTANEOUS | Status: AC
  Administered 2016-09-02 – 2016-09-07 (×10): 1 via NASAL
  Filled 2016-09-02 (×2): qty 22

## 2016-09-02 MED ORDER — LIDOCAINE HCL 1 % IJ SOLN
INTRAMUSCULAR | Status: AC
  Filled 2016-09-02: qty 20

## 2016-09-02 MED ORDER — FENTANYL CITRATE (PF) 100 MCG/2ML IJ SOLN
12.5000 ug | INTRAMUSCULAR | Status: DC | PRN
  Administered 2016-09-02 – 2016-09-03 (×4): 25 ug via INTRAVENOUS
  Filled 2016-09-02 (×4): qty 2

## 2016-09-02 MED ORDER — ORAL CARE MOUTH RINSE
15.0000 mL | Freq: Two times a day (BID) | OROMUCOSAL | Status: DC
  Administered 2016-09-02 – 2016-09-11 (×15): 15 mL via OROMUCOSAL

## 2016-09-02 NOTE — Progress Notes (Signed)
PROGRESS NOTE  MEI SUITS ZOX:[id number] DOB: 1961-06-21 DOA: 09/01/2016 PCP: [name], MD   LOS: 1 day   Brief Narrative: Christine [name] is a 56 y.o. 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  Assessment & Plan: Principal Problem:   Shock (HCC) Active Problems:   Alcoholic cirrhosis of liver with ascites (HCC)   Anemia of chronic disease   Ascites due to alcoholic cirrhosis (HCC)   Hepatic encephalopathy (HCC)   Hyperkalemia   AKI (acute kidney injury) (HCC)   Shock - Unclear cause.  Differential includes sepsis, hypovolemia in context of end stage liver disease.   - Continue fluids - Status post albumin - Continue vancomycin and Zosyn - Underwent paracentesis this morning, follow-up on fluid status to see if she's got SBP  Goals of care - Discussed with husband at bedside, she is sensitive liver disease and is not doing very well this morning. She is hypotensive and appears to be in shock. He understands her grim prognosis. Awaiting palliative care input, husband asks that he would like to talk to them.  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:  - no INR and cannot complete meld - Heading towards comfort care  Hyponatremia - in the setting of fluid overload, cannot diurese at this point  Hyperkalemia - Due to renal failure  Acute kidney injury - Patient is septic shock, monitor renal function with fluids  Anemia - of chronic liver disease  DVT prophylaxis: SCDs Code Status: DNR Family Communication: husband bedside Disposition Plan: TBD  Consultants:   Palliative   Procedures:   Paracentesis 1/30   Antimicrobials:  Vancomycin 1/29 >>  Zosyn 1/29 >>   Subjective: - Appears uncomfortable, complains of abdominal pain. She appears  confused  Objective: Vitals:   09/02/16 1115 09/02/16 1135 09/02/16 1137 09/02/16 1142  BP: (!) 89/40 (!) 82/41 (!) 80/46 (!) 83/38  Pulse:      Resp:      Temp:      TempSrc:      SpO2:        Intake/Output Summary (Last 24 hours) at 09/02/16 1156 Last data filed at 09/02/16 0545  Gross per 24 hour  Intake             2950 ml  Output             1250 ml  Net             1700 ml   There were no vitals filed for this visit.  Examination: Constitutional: appears in distress complaining of abdominal pain  Vitals:   09/02/16 1115 09/02/16 1135 09/02/16 1137 09/02/16 1142  BP: (!) 89/40 (!) 82/41 (!) 80/46 (!) 83/38  Pulse:      Resp:      Temp:      TempSrc:      SpO2:       Eyes: PERRL, lids and conjunctivae pale ENMT: Mucous membranes are dry Respiratory: clear to auscultation bilaterally, no wheezing, no crackles. Poor respiratory effort Cardiovascular: Regular rate and rhythm, no murmurs / rubs / gallops.  Abdomen: Distended. Significant amount of ascites. Tender to palpation throughout  Musculoskeletal: no clubbing / cyanosis. Decreased muscle tone Neurologic: moves all 4, non focal    Data Reviewed: I have personally reviewed following labs and imaging studies  CBC:  Recent Labs  Lab 09/01/16 2004 09/02/16 0217  WBC 9.0 7.1  NEUTROABS 6.4  --   HGB 8.0* 7.5*  HCT 22.9* 21.5*  MCV 84.8 84.6  PLT 183 169   Basic Metabolic Panel:  Recent Labs Lab 09/01/16 2004 09/02/16 0217  NA 131* 130*  K 5.7* 5.4*  CL 107 110  CO2 15* 13*  GLUCOSE 101* 95  BUN 101* 96*  CREATININE 2.12* 2.15*  CALCIUM 7.0* 7.0*   GFR: Estimated Creatinine Clearance: 29.5 mL/min (by C-G formula based on SCr of 2.15 mg/dL (H)). Liver Function Tests:  Recent Labs Lab 09/01/16 2004 09/02/16 0217  AST 28 25  ALT 14 12*  ALKPHOS 83 65  BILITOT 1.3* 1.4*  PROT 5.0* 5.1*  ALBUMIN 1.2* 1.8*   No results for input(s): LIPASE, AMYLASE in the last 168 hours.  Recent  Labs Lab 09/01/16 2004  AMMONIA 150*   Coagulation Profile: No results for input(s): INR, PROTIME in the last 168 hours. Cardiac Enzymes: No results for input(s): CKTOTAL, CKMB, CKMBINDEX, TROPONINI in the last 168 hours. BNP (last 3 results) No results for input(s): PROBNP in the last 8760 hours. HbA1C: No results for input(s): HGBA1C in the last 72 hours. CBG:  Recent Labs Lab 09/01/16 1941  GLUCAP 99   Lipid Profile: No results for input(s): CHOL, HDL, LDLCALC, TRIG, CHOLHDL, LDLDIRECT in the last 72 hours. Thyroid Function Tests: No results for input(s): TSH, T4TOTAL, FREET4, T3FREE, THYROIDAB in the last 72 hours. Anemia Panel: No results for input(s): VITAMINB12, FOLATE, FERRITIN, TIBC, IRON, RETICCTPCT in the last 72 hours. Urine analysis:    Component Value Date/Time   COLORURINE YELLOW 09/01/2016 2143   APPEARANCEUR CLEAR 09/01/2016 2143   LABSPEC 1.010 09/01/2016 2143   PHURINE 5.0 09/01/2016 2143   GLUCOSEU NEGATIVE 09/01/2016 2143   HGBUR NEGATIVE 09/01/2016 2143   BILIRUBINUR NEGATIVE 09/01/2016 2143   BILIRUBINUR small 12/27/2011 1031   KETONESUR NEGATIVE 09/01/2016 2143   PROTEINUR NEGATIVE 09/01/2016 2143   UROBILINOGEN 0.2 12/27/2011 1031   NITRITE NEGATIVE 09/01/2016 2143   LEUKOCYTESUR NEGATIVE 09/01/2016 2143    Radiology Studies: Dg Chest Portable 1 View  Result Date: 09/01/2016 CLINICAL DATA:  Altered mental status. Abdominal distention. Bradypnea. EXAM: PORTABLE CHEST 1 VIEW COMPARISON:  Chest radiographs 07/21/2016 FINDINGS: Low lung volumes. Patient's hand partially obscures evaluation of the left lung base. Improved left basilar aeration with probable residual small volume pleural fluid. Streaky bibasilar opacities likely atelectasis. Normal heart size. Unchanged mediastinal contours. No pneumothorax. No acute osseous abnormality. IMPRESSION: Low lung volumes with small left pleural effusion, decreased in size from radiographs 6 weeks prior.  Streaky bibasilar atelectasis. Electronically Signed   By: Rubye OaksMelanie  Ehinger M.D.   On: 09/01/2016 21:20     Scheduled Meds: . lidocaine      . sodium chloride flush  3 mL Intravenous Q12H  . vancomycin  750 mg Intravenous Q24H   Continuous Infusions: . sodium chloride 100 mL/hr at 09/01/16 2256  . piperacillin-tazobactam (ZOSYN)  IV 3.375 g (09/02/16 16100828)    Pamella Pertostin Gherghe, MD, PhD [facility] Pager [phone]  If 7PM-7AM, please contact night-coverage [email or web address] Password [email or web address] 09/02/2016, 11:56 AM

## 2016-09-02 NOTE — Procedures (Signed)
Pt being admitted with shock, hypotension, AKI, known liver disease and ascites Previously tolerated large volume paracentesis. Condition guarded with poor prognosis.  PROCEDURE SUMMARY:  Successful US guided paracentesis from RLQ Yielded 2.5L of clear yellow fluid. Procedure terminated at this point due to hypotension. No immediate complications.  Specimen was sent for labs. If status/BP improve, can bring pt back for larger volume therapeutic paracentesis as desired.  Brayton ElBRUNING, Jadzia Ibsen PA-C 09/02/2016 11:49 AM

## 2016-09-02 NOTE — ED Notes (Signed)
Pt transported to US paracentesis

## 2016-09-02 NOTE — Consult Note (Signed)
Consultation Note Date: 09/02/2016   Patient Name: Christine [name]  DOB: 1961/02/17  MRN: [id number]  Age / Sex: 56 y.o., female  PCP: Elwyn Reach, MD Referring Physician: Caren Griffins, MD  Reason for Consultation: Establishing goals of care in light of her liver disease and hypotension due to septic shock  HPI/Patient Profile: 56 y.o. female  with past medical history of alcoholic liver disease, recurrent ascites, repeated paracenteses admitted on 09/01/2016 with shock likely related to sepsis.   Clinical Assessment and Goals of Care: I met today with patient and her spouse, whom I know from prior admission. Patient was not able to participate in conversation today.  Her husband and I discussed again role of palliative medicine. We also reviewed prior conversation the most important thing to her is her family and trying to be back at her home.   He feels the doctors have been doing a good job explaining things to him again this admission. We reviewed her clinical course over the past month at skilled facility. He has continued to see decline in her nutrition, functional status, and cognition. He states the past few days have been even worse than the prior couple of weeks. He does not feel that she is likely ever going to be well enough to get back to their home.  He understands that there is a good chance we are reaching end of her life. He does not want her to suffer and states that she would not want to continue to escalate the aggressiveness of interventions past current therapy of fluids and antibiotics. We discussed pathways forward including continuation with current therapies overnight versus refocusing care on a comfort based approach. He wants to continue with current therapies overnight, but, if she continues to decline, focus should be on her comfort. We discussed hospice again in reviewed  residential hospice and how this may benefit her as she approaches the end of her life. He is open to this idea, and he is agreeable to meeting again tomorrow to continue conversation if she continues to decline or does not show any signs of improvement.  Patient's husband continues to be very busy at his job in a greenhouse.  He reports the earliest he can meet tomorrow will be 3 PM.  We have set up a follow-up meeting for that time.   SUMMARY OF RECOMMENDATIONS   - Her husband understands that she is critically ill and is a good possibility she is approaching the end of her life. Plan to continue current therapy for another 24 hours. No escalation of care in the event of decompensation.  - If she continues to worsen, goal will be to focus on comfort. - We have set up another meeting for tomorrow at 3 PM. We began initial discussions for discharge to residential hospice for end-of-life care. He is in agreement that this will be the best plan of care moving forward if she does not show any improvement overnight. - Medications for pain or shortness of breath should  not be held due to concerns for hypotension if needed to ensure her comfort. I specifically discussed this risk with her husband and he is in agreement.  Code Status/Advance Care Planning:  DNR   Symptom Management:   Pain: Appears well controlled currently, however, this is been noted to be an issue since she has presented to the hospital. She is status post paracentesis and this seems to have improved some abdominal discomfort. Due to the fact she has elevated creatinine, will transition from morphine to fentanyl as needed. I did discuss with her husband regarding her comfort moving forward. While plan is to continue current care for another 24 hours, he is in agreement that she would want to have medications administered even if she is hypotensive and administration of medications for her comfort may cause undesired side effects  including further hypertension and/or death. Based upon this conversation, I also DC'd order for Narcan.  Palliative Prophylaxis:   Aspiration, Bowel Regimen, Delirium Protocol and Frequent Pain Assessment  Additional Recommendations (Limitations, Scope, Preferences):  Continue current therapy. No escalation of care in the event of decompensation.  Psycho-social/Spiritual:   Desire for further Chaplaincy support: Did not address today  Additional Recommendations: Caregiving  Support/Resources and Education on Hospice  Prognosis:   Unable to determine, but I think it is highly likely that she will continue to decline and we were approaching the end-of-life.  Discharge Planning: To Be Determined. If she does not improve overnight, we discussed the possibility of residential hospice.      Primary Diagnoses: Present on Admission: . Hepatic encephalopathy ([place]) . Shock ([place]) . Alcoholic cirrhosis of liver with ascites ([place]) . Anemia of chronic disease . Ascites due to alcoholic cirrhosis ([place]) . Hyperkalemia . AKI (acute kidney injury) ([place])   I have reviewed the medical record, interviewed the patient and family, and examined the patient. The following aspects are pertinent.  Past Medical History:  Diagnosis Date  . Abnormal Pap smear    displasia had colposcopy  . Anemia   . Arthritis   . Closed right humeral fracture    Humeral head fracture  . Depression    not currently on meds  . GERD (gastroesophageal reflux disease)   . Gout   . Hernia   . Hypertension   . Psoriasis    Social History   Social History  . Marital status: Married    Spouse name: N/A  . Number of children: N/A  . Years of education: N/A   Social History Main Topics  . Smoking status: Former Smoker    Packs/day: 1.00    Years: 15.00    Types: Cigarettes    Quit date: 04/18/2016  . Smokeless tobacco: Never Used     Comment: Admitted to the hospital 04/18/16  . Alcohol use No      Comment: couple glasses liquor day  . Drug use: No     Comment: Alcohol level 04/18/16 was 132 at admission  . Sexual activity: Not Currently   Other Topics Concern  . None   Social History Narrative  . None   Family History  Problem Relation Age of Onset  . Hypertension Mother   . Diabetes Mother   . Hypertension Father   . Diabetes Brother    Scheduled Meds: . lidocaine      . sodium chloride flush  3 mL Intravenous Q12H  . vancomycin  750 mg Intravenous Q24H   Continuous Infusions: . sodium chloride 100 mL/hr at 09/01/16  2256  . piperacillin-tazobactam (ZOSYN)  IV 3.375 g (09/02/16 0828)   PRN Meds:.fentaNYL (SUBLIMAZE) injection, LORazepam Medications Prior to Admission:  Prior to Admission medications   Medication Sig Start Date End Date Taking? Authorizing Provider  acetaminophen (TYLENOL) 325 MG tablet Take 650 mg by mouth every 6 (six) hours as needed for mild pain.   Yes Historical Provider, MD  CALCIUM ALGINATE EX Apply 1 application topically See admin instructions. Apply to buttocks topically every day shift for wound care/ clean area with wound cleanser, apply calcium alginate and cover with dry dressing   Yes Historical Provider, MD  diphenhydrAMINE (BENADRYL) 25 mg capsule Take 1 capsule (25 mg total) by mouth every 8 (eight) hours as needed for itching. 07/30/16  Yes Barton Dubois, MD  fentaNYL (DURAGESIC - DOSED MCG/HR) 25 MCG/HR patch Place 1 patch (25 mcg total) onto the skin every 3 (three) days. 08/01/16  Yes Tiffany L Reed, DO  ferrous sulfate 325 (65 FE) MG tablet Take 325 mg by mouth daily with breakfast.   Yes Historical Provider, MD  furosemide (LASIX) 20 MG tablet Take 3 tablets (60 mg total) by mouth 2 (two) times daily. 07/30/16  Yes Barton Dubois, MD  hydrocerin (EUCERIN) CREA Apply 1 application topically 2 (two) times daily. Apply to her skin, to maintain good moisture level 07/30/16  Yes Barton Dubois, MD  hydrOXYzine (ATARAX/VISTARIL) 10 MG  tablet Take 10 mg by mouth 2 (two) times daily.   Yes Historical Provider, MD  lactulose (CHRONULAC) 10 GM/15ML solution Take 45 mLs (30 g total) by mouth daily. 07/30/16  Yes Barton Dubois, MD  Multiple Vitamins-Minerals (DECUBI-VITE) CAPS Take 1 capsule by mouth daily.   Yes Historical Provider, MD  Nutritional Supplements (NUTRITIONAL SUPPLEMENT PO) Take 60 mLs by mouth 2 (two) times daily. Med pass - house 2.0   Yes Historical Provider, MD  oxyCODONE (OXY IR/ROXICODONE) 5 MG immediate release tablet Take 5 mg by mouth every 3 (three) hours as needed for moderate pain or severe pain. related to Alcoholic Cirrhosis of Liver with Ascites 07/18/16  Yes Historical Provider, MD  pantoprazole (PROTONIX) 40 MG tablet Take 1 tablet (40 mg total) by mouth daily. Patient taking differently: Take 40 mg by mouth daily at 6 PM.  07/31/16  Yes Barton Dubois, MD  PROTEIN PO Take 30 mLs by mouth 2 (two) times daily.   Yes Historical Provider, MD  spironolactone (ALDACTONE) 100 MG tablet Take 100 mg by mouth daily.   Yes Historical Provider, MD  thiamine 100 MG tablet Take 1 tablet (100 mg total) by mouth daily. 04/25/16  Yes Debbe Odea, MD  traZODone (DESYREL) 100 MG tablet Take 100 mg by mouth at bedtime.   Yes Historical Provider, MD   Allergies  Allergen Reactions  . Ibuprofen Other (See Comments)    Pt is unable to take due to her hernia.    . Sulfa Antibiotics Nausea Only and Rash   Review of Systems  Unable to obtain  Physical Exam  General: Lying in bed with eyes closed. Opens eyes with tactile stimulation but does not track examiner. No acute distress HEENT: mucous membranes dry, conjunctiva pale Heart: Regular rate and rhythm. No murmur appreciated. Lungs: Fair air movement, clear Abdomen: Distended, positive bowel sounds.  Ext: +lower extremity edema Skin: Warm and dry  Vital Signs: BP (!) 83/38 (BP Location: Left Arm)   Pulse 76   Temp 98 F (36.7 C) (Rectal)   Resp (!) 9   SpO2  100%  Pain Assessment: PAINAD       SpO2: SpO2: 100 % O2 Device:SpO2: 100 % O2 Flow Rate: .O2 Flow Rate (L/min): 2 L/min  IO: Intake/output summary:  Intake/Output Summary (Last 24 hours) at 09/02/16 1317 Last data filed at 09/02/16 0545  Gross per 24 hour  Intake             2950 ml  Output             1250 ml  Net             1700 ml    LBM:   Baseline Weight:   Most recent weight:       Palliative Assessment/Data:   Flowsheet Rows   Flowsheet Row Most Recent Value  Intake Tab  Referral Department  Hospitalist  Unit at Time of Referral  ER  Palliative Care Primary Diagnosis  Other (Comment) [cirrhosis]  Date Notified  09/02/16  Palliative Care Type  Return patient Palliative Care  Reason for referral  Clarify Goals of Care, End of Life Care Assistance  Date of Admission  09/01/16  Date first seen by Palliative Care  09/02/16  # of days Palliative referral response time  0 Day(s)  # of days IP prior to Palliative referral  1  Clinical Assessment  Palliative Performance Scale Score  20%  Pain Max last 24 hours  Not able to report  Pain Min Last 24 hours  Not able to report  Psychosocial & Spiritual Assessment  Palliative Care Outcomes  Patient/Family meeting held?  Yes  Who was at the meeting?  Patient husband  Palliative Care Outcomes  Clarified goals of care, Counseled regarding hospice, Improved pain interventions      Time In: 1200 Time Out: 1315 Time Total: 75 Greater than 50%  of this time was spent counseling and coordinating care related to the above assessment and plan.  Signed by: Micheline Rough, MD   Please contact Palliative Medicine Team phone at [phone] for questions and concerns.  For individual provider: See Shea Evans

## 2016-09-02 NOTE — ED Notes (Signed)
Pt's husband at the bedside. States pt has been "declining" for the past few days. She had a paracentesis Friday and a swallow screen today at her facility.

## 2016-09-02 NOTE — Progress Notes (Signed)
PHARMACY - PHYSICIAN COMMUNICATION CRITICAL VALUE ALERT - BLOOD CULTURE IDENTIFICATION (BCID)  Results for orders placed or performed during the hospital encounter of 09/01/16  Blood Culture ID Panel (Reflexed) (Collected: 09/01/2016  8:07 PM)  Result Value Ref Range   Enterococcus species NOT DETECTED NOT DETECTED   Listeria monocytogenes NOT DETECTED NOT DETECTED   Staphylococcus species DETECTED (A) NOT DETECTED   Staphylococcus aureus NOT DETECTED NOT DETECTED   Methicillin resistance DETECTED (A) NOT DETECTED   Streptococcus species NOT DETECTED NOT DETECTED   Streptococcus agalactiae NOT DETECTED NOT DETECTED   Streptococcus pneumoniae NOT DETECTED NOT DETECTED   Streptococcus pyogenes NOT DETECTED NOT DETECTED   Acinetobacter baumannii NOT DETECTED NOT DETECTED   Enterobacteriaceae species NOT DETECTED NOT DETECTED   Enterobacter cloacae complex NOT DETECTED NOT DETECTED   Escherichia coli NOT DETECTED NOT DETECTED   Klebsiella oxytoca NOT DETECTED NOT DETECTED   Klebsiella pneumoniae NOT DETECTED NOT DETECTED   Proteus species NOT DETECTED NOT DETECTED   Serratia marcescens NOT DETECTED NOT DETECTED   Haemophilus influenzae NOT DETECTED NOT DETECTED   Neisseria meningitidis NOT DETECTED NOT DETECTED   Pseudomonas aeruginosa NOT DETECTED NOT DETECTED   Candida albicans NOT DETECTED NOT DETECTED   Candida glabrata NOT DETECTED NOT DETECTED   Candida krusei NOT DETECTED NOT DETECTED   Candida parapsilosis NOT DETECTED NOT DETECTED   Candida tropicalis NOT DETECTED NOT DETECTED    Name of physician (or Provider) Contacted: IM  Changes to prescribed antibiotics required: none, as could be contaminant and currently covered broadly with Zosyn and vancomycin.   Pollyann SamplesAndy Lyvonne Cassell, PharmD, BCPS 09/02/2016, 6:03 PM

## 2016-09-02 NOTE — ED Notes (Signed)
Bair hugger removed  

## 2016-09-02 NOTE — ED Notes (Signed)
Husband at bedside, updated on pt status. Will notify admitting that family is at bedside.

## 2016-09-03 LAB — COMPREHENSIVE METABOLIC PANEL
ALBUMIN: 1.5 g/dL — AB (ref 3.5–5.0)
ALK PHOS: 65 U/L (ref 38–126)
ALT: 14 U/L (ref 14–54)
ANION GAP: 5 (ref 5–15)
AST: 29 U/L (ref 15–41)
BUN: 91 mg/dL — AB (ref 6–20)
CALCIUM: 7.2 mg/dL — AB (ref 8.9–10.3)
CO2: 15 mmol/L — AB (ref 22–32)
Chloride: 113 mmol/L — ABNORMAL HIGH (ref 101–111)
Creatinine, Ser: 2 mg/dL — ABNORMAL HIGH (ref 0.44–1.00)
GFR calc Af Amer: 31 mL/min — ABNORMAL LOW (ref >60)
GFR calc non Af Amer: 27 mL/min — ABNORMAL LOW (ref >60)
Glucose, Bld: 69 mg/dL (ref 65–99)
Potassium: 5.5 mmol/L — ABNORMAL HIGH (ref 3.5–5.1)
SODIUM: 133 mmol/L — AB (ref 135–145)
Total Bilirubin: 1.5 mg/dL — ABNORMAL HIGH (ref 0.3–1.2)
Total Protein: 5 g/dL — ABNORMAL LOW (ref 6.5–8.1)

## 2016-09-03 LAB — CULTURE, BLOOD (ROUTINE X 2)

## 2016-09-03 LAB — PROTIME-INR
INR: 1.68
PROTHROMBIN TIME: 20 s — AB (ref 11.4–15.2)

## 2016-09-03 LAB — GLUCOSE, CAPILLARY
GLUCOSE-CAPILLARY: 108 mg/dL — AB (ref 65–99)
Glucose-Capillary: 64 mg/dL — ABNORMAL LOW (ref 65–99)
Glucose-Capillary: 112 mg/dL — ABNORMAL HIGH (ref 65–99)

## 2016-09-03 LAB — CBC
HEMATOCRIT: 27.2 % — AB (ref 36.0–46.0)
HEMOGLOBIN: 9.4 g/dL — AB (ref 12.0–15.0)
MCH: 29.5 pg (ref 26.0–34.0)
MCHC: 34.6 g/dL (ref 30.0–36.0)
MCV: 85.3 fL (ref 78.0–100.0)
Platelets: 186 10*3/uL (ref 150–400)
RBC: 3.19 MIL/uL — ABNORMAL LOW (ref 3.87–5.11)
RDW: 16.3 % — AB (ref 11.5–15.5)
WBC: 9.7 10*3/uL (ref 4.0–10.5)

## 2016-09-03 LAB — PATHOLOGIST SMEAR REVIEW

## 2016-09-03 MED ORDER — DEXTROSE 50 % IV SOLN
INTRAVENOUS | Status: AC
  Filled 2016-09-03: qty 50

## 2016-09-03 MED ORDER — FENTANYL CITRATE (PF) 100 MCG/2ML IJ SOLN
12.5000 ug | INTRAMUSCULAR | Status: DC | PRN
  Administered 2016-09-03 – 2016-09-04 (×2): 12.5 ug via INTRAVENOUS
  Administered 2016-09-04 – 2016-09-05 (×4): 25 ug via INTRAVENOUS
  Administered 2016-09-05 (×2): 12.5 ug via INTRAVENOUS
  Administered 2016-09-06 – 2016-09-08 (×12): 25 ug via INTRAVENOUS
  Filled 2016-09-03 (×20): qty 2

## 2016-09-03 MED ORDER — DEXTROSE 50 % IV SOLN
25.0000 mL | Freq: Once | INTRAVENOUS | Status: AC
  Administered 2016-09-03: 25 mL via INTRAVENOUS

## 2016-09-03 MED ORDER — MIDODRINE HCL 5 MG PO TABS
5.0000 mg | ORAL_TABLET | Freq: Three times a day (TID) | ORAL | Status: DC
  Administered 2016-09-03 – 2016-09-05 (×6): 5 mg via ORAL
  Filled 2016-09-03 (×6): qty 1

## 2016-09-03 MED ORDER — LACTULOSE 10 GM/15ML PO SOLN: 30.0000 g | Freq: Every day | ORAL | Status: DC

## 2016-09-03 MED ORDER — LACTULOSE 10 GM/15ML PO SOLN
30.0000 g | Freq: Three times a day (TID) | ORAL | Status: DC
  Administered 2016-09-03 – 2016-09-11 (×15): 30 g via ORAL
  Filled 2016-09-03 (×20): qty 45

## 2016-09-03 NOTE — Progress Notes (Signed)
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  Subjective: The patient is alert but lethargic.  She is not able to provide a reliable history and simply repeats the same answer to each question.  She does not have her appear to be in acute respiratory distress or suffering with uncontrolled pain.  Assessment & Plan:  Shock - Hypotension  - Unclear cause - septic v/s hypovolemic in context of end stage liver disease - Status post albumin - Discontinue antibiotics with ascites fluid not convincing for SBP - baseline SBP appears to be ~100 per prior admits - give trial of midodrine  1 of 2 Blood cx + for Meth resistant Coag Neg Staph  - without current active signs to suggest infection I suspect this is a contaminant - discontinue antibiotics and follow clinically  Goals of care - Husband wishes to continue conservative medical care  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 - Resume lactulose but at significantly higher dose and follow ammonia level  Hyponatremia - in the setting of fluid overload/liver disease - cannot diurese due to hypotension - follow trend    Hyperkalemia - Due to renal failure - follow trend  Acute kidney injury - renal fxn w/o signif change today  Recent Labs Lab 09/01/16 2004 09/02/16 0217 09/03/16 0358  CREATININE 2.12* 2.15* 2.00*   Anemia - of chronic liver disease  MRSA screen +   DVT prophylaxis: SCDs Code Status: DNR - NO CODE Family Communication: no family present at time of exam  Disposition Plan: SDU   Consultants:  Palliative Care   Procedures: 1/30 paracentesis   Antimicrobials:  Vancomycin  1/29 > Zosyn 1/29 >  Objective: Blood pressure (!) 84/54, pulse (!) 103, temperature 99.4 F (37.4 C), temperature source Axillary, resp. rate 16, height 5\' 4"  (1.626 m), weight 69.1 kg (152 lb 5.4 oz), SpO2 100 %.  Intake/Output Summary (Last 24 hours) at 09/03/16 1626 Last data filed at 09/03/16 1500  Gross per 24 hour  Intake          2199.67 ml  Output                0 ml  Net          2199.67 ml   Filed Weights   09/02/16 1515  Weight: 69.1 kg (152 lb 5.4 oz)    Examination: General: No acute respiratory distress - lethargic  Lungs: Clear to auscultation bilaterally without wheezes or crackles Cardiovascular: Regular rate and rhythm without murmur gallop or rub normal S1 and S2 Abdomen: Protuberant, soft, mildly tender to deep palpation diffusely, bowel sounds hypoactive Extremities: 1+ pitting edema bilateral lower extremities  CBC:  Recent Labs Lab 09/01/16 2004 09/02/16 0217 09/03/16 0358  WBC 9.0 7.1 9.7  NEUTROABS 6.4  --   --   HGB 8.0* 7.5* 9.4*  HCT 22.9* 21.5* 27.2*  MCV 84.8 84.6 85.3  PLT 183 169 186   Basic Metabolic Panel:  Recent Labs Lab 09/01/16 2004 09/02/16 0217 09/03/16 0358  NA 131* 130* 133*  K 5.7* 5.4* 5.5*  CL 107 110 113*  CO2 15* 13* 15*  GLUCOSE 101* 95 69  BUN 101* 96* 91*  CREATININE 2.12*  2.15* 2.00*  CALCIUM 7.0* 7.0* 7.2*   GFR: Estimated Creatinine Clearance: 30 mL/min (by C-G formula based on SCr of 2 mg/dL (H)).  Liver Function Tests:  Recent Labs Lab 09/01/16 2004 09/02/16 0217 09/03/16 0358  AST 28 25 29   ALT 14 12* 14  ALKPHOS 83 65 65  BILITOT 1.3* 1.4* 1.5*  PROT 5.0* 5.1* 5.0*  ALBUMIN 1.2* 1.8* 1.5*    Recent Labs Lab 09/01/16 2004  AMMONIA 150*    Coagulation Profile: No results for input(s): INR, PROTIME in the last 168 hours.  CBG:  Recent Labs Lab 09/01/16 1941 09/02/16 2314 09/03/16 0804 09/03/16 1042  GLUCAP 99 83 64* 112*    Recent Results (from the past 240 hour(s))    Blood culture (routine x 2)     Status: None (Preliminary result)   Collection Time: 09/01/16  7:30 PM  Result Value Ref Range Status   Specimen Description BLOOD LEFT HAND  Final   Special Requests BOTTLES DRAWN AEROBIC ONLY 7CC  Final   Culture NO GROWTH 2 DAYS  Final   Report Status PENDING  Incomplete  Blood culture (routine x 2)     Status: Abnormal   Collection Time: 09/01/16  8:07 PM  Result Value Ref Range Status   Specimen Description BLOOD RIGHT HAND  Final   Special Requests IN PEDIATRIC BOTTLE 5CC  Final   Culture  Setup Time   Final    GRAM POSITIVE COCCI IN CLUSTERS IN PEDIATRIC BOTTLE CRITICAL RESULT CALLED TO, READ BACK BY AND VERIFIED WITH: ADagoberto Ligas. Johnston Pharm.D. 17:50 09/02/16 ([email or web address])    Culture (A)  Final    STAPHYLOCOCCUS SPECIES (COAGULASE NEGATIVE) THE SIGNIFICANCE OF ISOLATING THIS ORGANISM FROM A SINGLE SET OF BLOOD CULTURES WHEN MULTIPLE SETS ARE DRAWN IS UNCERTAIN. PLEASE NOTIFY THE MICROBIOLOGY DEPARTMENT WITHIN ONE WEEK IF SPECIATION AND SENSITIVITIES ARE REQUIRED.    Report Status 09/03/2016 FINAL  Final  Blood Culture ID Panel (Reflexed)     Status: Abnormal   Collection Time: 09/01/16  8:07 PM  Result Value Ref Range Status   Enterococcus species NOT DETECTED NOT DETECTED Final   Listeria monocytogenes NOT DETECTED NOT DETECTED Final   Staphylococcus species DETECTED (A) NOT DETECTED Final    Comment: Methicillin (oxacillin) resistant coagulase negative staphylococcus. Possible blood culture contaminant (unless isolated from more than one blood culture draw or clinical case suggests pathogenicity). No antibiotic treatment is indicated for blood  culture contaminants. CRITICAL RESULT CALLED TO, READ BACK BY AND VERIFIED WITH: ADagoberto Ligas. Johnston Pharm.D. 17:50 09/02/16 ([email or web address])    Staphylococcus aureus NOT DETECTED NOT DETECTED Final   Methicillin resistance DETECTED (A) NOT DETECTED Final    Comment: CRITICAL RESULT CALLED TO, READ BACK BY AND VERIFIED  WITH: ADagoberto Ligas. Johnston Pharm.D. 17:50 09/02/16 ([email or web address])    Streptococcus species NOT DETECTED NOT DETECTED Final   Streptococcus agalactiae NOT DETECTED NOT DETECTED Final   Streptococcus pneumoniae NOT DETECTED NOT DETECTED Final   Streptococcus pyogenes NOT DETECTED NOT DETECTED Final   Acinetobacter baumannii NOT DETECTED NOT DETECTED Final   Enterobacteriaceae species NOT DETECTED NOT DETECTED Final   Enterobacter cloacae complex NOT DETECTED NOT DETECTED Final   Escherichia coli NOT DETECTED NOT DETECTED Final   Klebsiella oxytoca NOT DETECTED NOT DETECTED Final   Klebsiella pneumoniae NOT DETECTED NOT DETECTED Final   Proteus species NOT DETECTED NOT DETECTED Final   Serratia marcescens NOT DETECTED NOT DETECTED Final   Haemophilus influenzae NOT DETECTED NOT DETECTED Final   Neisseria meningitidis  NOT DETECTED NOT DETECTED Final   Pseudomonas aeruginosa NOT DETECTED NOT DETECTED Final   Candida albicans NOT DETECTED NOT DETECTED Final   Candida glabrata NOT DETECTED NOT DETECTED Final   Candida krusei NOT DETECTED NOT DETECTED Final   Candida parapsilosis NOT DETECTED NOT DETECTED Final   Candida tropicalis NOT DETECTED NOT DETECTED Final  Culture, body fluid-bottle     Status: None (Preliminary result)   Collection Time: 09/02/16 11:49 AM  Result Value Ref Range Status   Specimen Description FLUID PERITONEAL  Final   Special Requests NONE  Final   Culture NO GROWTH 1 DAY  Final   Report Status PENDING  Incomplete  Gram stain     Status: None   Collection Time: 09/02/16 11:49 AM  Result Value Ref Range Status   Specimen Description FLUID PERITONEAL  Final   Special Requests NONE  Final   Gram Stain   Final    RARE WBC PRESENT, PREDOMINANTLY PMN NO ORGANISMS SEEN    Report Status 09/02/2016 FINAL  Final  MRSA PCR Screening     Status: Abnormal   Collection Time: 09/02/16  3:27 PM  Result Value Ref Range Status   MRSA by PCR POSITIVE (A) NEGATIVE Final    Comment:         The GeneXpert MRSA Assay (FDA approved for NASAL specimens only), is one component of a comprehensive MRSA colonization surveillance program. It is not intended to diagnose MRSA infection nor to guide or monitor treatment for MRSA infections. RESULT CALLED TO, READ BACK BY AND VERIFIED WITHGwenith Spitz RN [id number] 09/02/16 A BROWNING      Scheduled Meds: . Chlorhexidine Gluconate Cloth  6 each Topical Q0600  . mouth rinse  15 mL Mouth Rinse BID  . mupirocin ointment  1 application Nasal BID  . piperacillin-tazobactam (ZOSYN)  IV  3.375 g Intravenous Q8H  . sodium chloride flush  3 mL Intravenous Q12H  . vancomycin  750 mg Intravenous Q24H   Continuous Infusions: . sodium chloride 100 mL/hr at 09/03/16 0749     LOS: 2 days   [name], MD [facility] Office  [phone] Pager - Text Page per Loretha Stapler as per below:  On-Call/Text Page:      [email or web address]      password [email or web address]  If 7PM-7AM, please contact night-coverage [email or web address] Password [facility] 09/03/2016, 4:26 PM

## 2016-09-04 DIAGNOSIS — Z515 Encounter for palliative care: Secondary | ICD-10-CM

## 2016-09-04 DIAGNOSIS — Z7189 Other specified counseling: Secondary | ICD-10-CM

## 2016-09-04 DIAGNOSIS — E875 Hyperkalemia: Secondary | ICD-10-CM

## 2016-09-04 DIAGNOSIS — R188 Other ascites: Secondary | ICD-10-CM

## 2016-09-04 HISTORY — DX: Hyperkalemia: E87.5

## 2016-09-04 HISTORY — DX: Other ascites: R18.8

## 2016-09-04 LAB — COMPREHENSIVE METABOLIC PANEL
ALK PHOS: 66 U/L (ref 38–126)
ALT: 16 U/L (ref 14–54)
AST: 37 U/L (ref 15–41)
Albumin: 1.5 g/dL — ABNORMAL LOW (ref 3.5–5.0)
Anion gap: 10 (ref 5–15)
BUN: 86 mg/dL — AB (ref 6–20)
CALCIUM: 7.8 mg/dL — AB (ref 8.9–10.3)
CO2: 10 mmol/L — AB (ref 22–32)
CREATININE: 1.9 mg/dL — AB (ref 0.44–1.00)
Chloride: 116 mmol/L — ABNORMAL HIGH (ref 101–111)
GFR calc non Af Amer: 28 mL/min — ABNORMAL LOW (ref >60)
GFR, EST AFRICAN AMERICAN: 33 mL/min — AB (ref >60)
GLUCOSE: 104 mg/dL — AB (ref 65–99)
Potassium: 5.4 mmol/L — ABNORMAL HIGH (ref 3.5–5.1)
SODIUM: 136 mmol/L (ref 135–145)
Total Bilirubin: 1.4 mg/dL — ABNORMAL HIGH (ref 0.3–1.2)
Total Protein: 5.4 g/dL — ABNORMAL LOW (ref 6.5–8.1)

## 2016-09-04 LAB — CBC
HCT: 30 % — ABNORMAL LOW (ref 36.0–46.0)
Hemoglobin: 10.4 g/dL — ABNORMAL LOW (ref 12.0–15.0)
MCH: 29.7 pg (ref 26.0–34.0)
MCHC: 34.7 g/dL (ref 30.0–36.0)
MCV: 85.7 fL (ref 78.0–100.0)
PLATELETS: 225 10*3/uL (ref 150–400)
RBC: 3.5 MIL/uL — ABNORMAL LOW (ref 3.87–5.11)
RDW: 16.5 % — AB (ref 11.5–15.5)
WBC: 14.8 10*3/uL — ABNORMAL HIGH (ref 4.0–10.5)

## 2016-09-04 LAB — AMMONIA: AMMONIA: 75 umol/L — AB (ref 9–35)

## 2016-09-04 NOTE — Progress Notes (Signed)
Patient ID: Christine [name], female   DOB: 03/06/1961, 56 y.o.   MRN: [id number]                                                                PROGRESS NOTE                                                                                                                                                                                                             Patient Demographics:    Christine [name], is a 56 y.o. female, DOB - Mar 11, 1961, WUJ:[id number]  Admit date - 09/01/2016   Admitting Physician Alberteen Sam, MD  Outpatient Primary MD for the patient is [name], MD  LOS - 3  Outpatient Specialists:    Chief Complaint  Patient presents with  . 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  Subjective: The patient is alert but lethargic.  She responds to voice,  Follows simple command to open her eyes.  Pt denies any complaints this am.  She does not have her appear to be in acute respiratory distress or suffering with uncontrolled pain.  Assessment & Plan:  Leukocytosis, afebrile Pt doesn't appear to be toxic and will repeat cbc in am  Shock - Hypotension  - Unclear cause - septic v/s hypovolemic in context of end stage liver disease - Status post albumin - Discontinue antibiotics with ascites fluid not convincing for SBP - baseline SBP appears to be ~100 per prior admits - on trial of midodrine  1 of 2 Blood cx + for Meth resistant Coag Neg Staph  - without current active signs to suggest infection I suspect this is a contaminant - not on  antibiotics and follow clinically  Goals of care - Husband wishes to continue conservative medical care  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 - Resume lactulose but at significantly higher  dose and follow ammonia level  Hyponatremia - in the setting of fluid overload/liver disease - cannot diurese due to hypotension - follow trend    Hyperkalemia - Due to renal failure - follow trend  Acute  kidney injury - renal fxn w/o signif change today  Last Labs    Recent Labs Lab 09/01/16 2004 09/02/16 0217 09/03/16 0358  CREATININE 2.12* 2.15* 2.00*     Anemia - of chronic liver disease  MRSA screen +   DVT prophylaxis: SCDs Code Status: DNR - NO CODE Family Communication: no family present at time of exam  Disposition Plan: SDU   Consultants:  Palliative Care   Procedures: 1/30 paracentesis   Antimicrobials:  Vancomycin 1/29 > Zosyn 1/29 >   Lab Results  Component Value Date   PLT 225 09/04/2016    Antibiotics  :   Anti-infectives    Start     Dose/Rate Route Frequency Ordered Stop   09/02/16 2300  vancomycin (VANCOCIN) IVPB 750 mg/150 ml premix  Status:  Discontinued     750 mg 150 mL/hr over 60 Minutes Intravenous Every 24 hours 09/01/16 2240 09/03/16 1653   09/02/16 0700  piperacillin-tazobactam (ZOSYN) IVPB 3.375 g  Status:  Discontinued     3.375 g 12.5 mL/hr over 240 Minutes Intravenous Every 8 hours 09/01/16 2240 09/03/16 1653   09/01/16 2245  piperacillin-tazobactam (ZOSYN) IVPB 3.375 g     3.375 g 100 mL/hr over 30 Minutes Intravenous  Once 09/01/16 2236 09/01/16 2324   09/01/16 2245  vancomycin (VANCOCIN) IVPB 1000 mg/200 mL premix     1,000 mg 200 mL/hr over 60 Minutes Intravenous  Once 09/01/16 2236 09/02/16 0036        Objective:   Vitals:   09/04/16 0300 09/04/16 0700 09/04/16 0740 09/04/16 0749  BP: (!) 90/47 (!) 93/54  (!) 96/52  Pulse: (!) 103 98  94  Resp: 14 13  15   Temp: 97.7 F (36.5 C) 97.6 F (36.4 C)  98.5 F (36.9 C)  TempSrc: Oral Oral  Oral  SpO2: 100% 100% 100% 100%  Weight:      Height:        Wt Readings from Last 3 Encounters:  09/02/16 69.1 kg (152 lb 5.4 oz)  08/29/16 78 kg (172 lb)    08/25/16 78 kg (172 lb)     Intake/Output Summary (Last 24 hours) at 09/04/16 1191 Last data filed at 09/04/16 0600  Gross per 24 hour  Intake          1970.34 ml  Output                0 ml  Net          1970.34 ml     Physical Exam  Awake somewhat somnolent, responds to basic questions. , No new F.N deficits, Normal affect [place].AT,PERRAL Supple Neck,No JVD, No cervical lymphadenopathy appriciated.  Symmetrical Chest wall movement, Good air movement bilaterally, CTAB RRR,No Gallops,Rubs or new Murmurs, No Parasternal Heave +ve B.Sounds, Abd Soft, No tenderness, No organomegaly appriciated, No rebound - guarding or rigidity. No Cyanosis, Clubbing or edema, No new Rash or bruise      Data Review:    CBC  Recent Labs Lab 09/01/16 2004 09/02/16 0217 09/03/16 0358 09/04/16 0428  WBC 9.0 7.1 9.7 14.8*  HGB 8.0* 7.5* 9.4* 10.4*  HCT 22.9* 21.5* 27.2* 30.0*  PLT 183 169 186 225  MCV 84.8 84.6 85.3 85.7  MCH 29.6 29.5 29.5 29.7  MCHC 34.9 34.9 34.6 34.7  RDW 16.3* 16.2* 16.3* 16.5*  LYMPHSABS 1.4  --   --   --   MONOABS 0.6  --   --   --   EOSABS 0.6  --   --   --  BASOSABS 0.0  --   --   --     Chemistries   Recent Labs Lab 09/01/16 2004 09/02/16 0217 09/03/16 0358 09/04/16 0428  NA 131* 130* 133* 136  K 5.7* 5.4* 5.5* 5.4*  CL 107 110 113* 116*  CO2 15* 13* 15* 10*  GLUCOSE 101* 95 69 104*  BUN 101* 96* 91* 86*  CREATININE 2.12* 2.15* 2.00* 1.90*  CALCIUM 7.0* 7.0* 7.2* 7.8*  AST 28 25 29  37  ALT 14 12* 14 16  ALKPHOS 83 65 65 66  BILITOT 1.3* 1.4* 1.5* 1.4*   ------------------------------------------------------------------------------------------------------------------ No results for input(s): CHOL, HDL, LDLCALC, TRIG, CHOLHDL, LDLDIRECT in the last 72 hours.  No results found for: HGBA1C ------------------------------------------------------------------------------------------------------------------ No results for input(s): TSH, T4TOTAL,  T3FREE, THYROIDAB in the last 72 hours.  Invalid input(s): FREET3 ------------------------------------------------------------------------------------------------------------------ No results for input(s): VITAMINB12, FOLATE, FERRITIN, TIBC, IRON, RETICCTPCT in the last 72 hours.  Coagulation profile  Recent Labs Lab 09/03/16 1832  INR 1.68    No results for input(s): DDIMER in the last 72 hours.  Cardiac Enzymes No results for input(s): CKMB, TROPONINI, MYOGLOBIN in the last 168 hours.  Invalid input(s): CK ------------------------------------------------------------------------------------------------------------------    Component Value Date/Time   BNP 76.3 05/03/2016 0019    Inpatient Medications  Scheduled Meds: . Chlorhexidine Gluconate Cloth  6 each Topical Q0600  . lactulose  30 g Oral TID  . mouth rinse  15 mL Mouth Rinse BID  . midodrine  5 mg Oral TID WC  . mupirocin ointment  1 application Nasal BID  . sodium chloride flush  3 mL Intravenous Q12H   Continuous Infusions: . sodium chloride 10 mL/hr at 09/03/16 1806   PRN Meds:.fentaNYL (SUBLIMAZE) injection, LORazepam  Micro Results Recent Results (from the past 240 hour(s))  Blood culture (routine x 2)     Status: None (Preliminary result)   Collection Time: 09/01/16  7:30 PM  Result Value Ref Range Status   Specimen Description BLOOD LEFT HAND  Final   Special Requests BOTTLES DRAWN AEROBIC ONLY 7CC  Final   Culture NO GROWTH 2 DAYS  Final   Report Status PENDING  Incomplete  Blood culture (routine x 2)     Status: Abnormal   Collection Time: 09/01/16  8:07 PM  Result Value Ref Range Status   Specimen Description BLOOD RIGHT HAND  Final   Special Requests IN PEDIATRIC BOTTLE 5CC  Final   Culture  Setup Time   Final    GRAM POSITIVE COCCI IN CLUSTERS IN PEDIATRIC BOTTLE CRITICAL RESULT CALLED TO, READ BACK BY AND VERIFIED WITH: ADagoberto Ligas. Johnston Pharm.D. 17:50 09/02/16 (wilsonm)    Culture (A)  Final      STAPHYLOCOCCUS SPECIES (COAGULASE NEGATIVE) THE SIGNIFICANCE OF ISOLATING THIS ORGANISM FROM A SINGLE SET OF BLOOD CULTURES WHEN MULTIPLE SETS ARE DRAWN IS UNCERTAIN. PLEASE NOTIFY THE MICROBIOLOGY DEPARTMENT WITHIN ONE WEEK IF SPECIATION AND SENSITIVITIES ARE REQUIRED.    Report Status 09/03/2016 FINAL  Final  Blood Culture ID Panel (Reflexed)     Status: Abnormal   Collection Time: 09/01/16  8:07 PM  Result Value Ref Range Status   Enterococcus species NOT DETECTED NOT DETECTED Final   Listeria monocytogenes NOT DETECTED NOT DETECTED Final   Staphylococcus species DETECTED (A) NOT DETECTED Final    Comment: Methicillin (oxacillin) resistant coagulase negative staphylococcus. Possible blood culture contaminant (unless isolated from more than one blood culture draw or clinical case suggests pathogenicity). No antibiotic treatment is indicated for blood  culture contaminants. CRITICAL RESULT CALLED TO, READ BACK BY AND VERIFIED WITH: ADagoberto Ligas.D. 17:50 09/02/16 (wilsonm)    Staphylococcus aureus NOT DETECTED NOT DETECTED Final   Methicillin resistance DETECTED (A) NOT DETECTED Final    Comment: CRITICAL RESULT CALLED TO, READ BACK BY AND VERIFIED WITH: ADagoberto Ligas.D. 17:50 09/02/16 (wilsonm)    Streptococcus species NOT DETECTED NOT DETECTED Final   Streptococcus agalactiae NOT DETECTED NOT DETECTED Final   Streptococcus pneumoniae NOT DETECTED NOT DETECTED Final   Streptococcus pyogenes NOT DETECTED NOT DETECTED Final   Acinetobacter baumannii NOT DETECTED NOT DETECTED Final   Enterobacteriaceae species NOT DETECTED NOT DETECTED Final   Enterobacter cloacae complex NOT DETECTED NOT DETECTED Final   Escherichia coli NOT DETECTED NOT DETECTED Final   Klebsiella oxytoca NOT DETECTED NOT DETECTED Final   Klebsiella pneumoniae NOT DETECTED NOT DETECTED Final   Proteus species NOT DETECTED NOT DETECTED Final   Serratia marcescens NOT DETECTED NOT DETECTED Final   Haemophilus  influenzae NOT DETECTED NOT DETECTED Final   Neisseria meningitidis NOT DETECTED NOT DETECTED Final   Pseudomonas aeruginosa NOT DETECTED NOT DETECTED Final   Candida albicans NOT DETECTED NOT DETECTED Final   Candida glabrata NOT DETECTED NOT DETECTED Final   Candida krusei NOT DETECTED NOT DETECTED Final   Candida parapsilosis NOT DETECTED NOT DETECTED Final   Candida tropicalis NOT DETECTED NOT DETECTED Final  Culture, body fluid-bottle     Status: None (Preliminary result)   Collection Time: 09/02/16 11:49 AM  Result Value Ref Range Status   Specimen Description FLUID PERITONEAL  Final   Special Requests NONE  Final   Culture NO GROWTH 1 DAY  Final   Report Status PENDING  Incomplete  Gram stain     Status: None   Collection Time: 09/02/16 11:49 AM  Result Value Ref Range Status   Specimen Description FLUID PERITONEAL  Final   Special Requests NONE  Final   Gram Stain   Final    RARE WBC PRESENT, PREDOMINANTLY PMN NO ORGANISMS SEEN    Report Status 09/02/2016 FINAL  Final  MRSA PCR Screening     Status: Abnormal   Collection Time: 09/02/16  3:27 PM  Result Value Ref Range Status   MRSA by PCR POSITIVE (A) NEGATIVE Final    Comment:        The GeneXpert MRSA Assay (FDA approved for NASAL specimens only), is one component of a comprehensive MRSA colonization surveillance program. It is not intended to diagnose MRSA infection nor to guide or monitor treatment for MRSA infections. RESULT CALLED TO, READ BACK BY AND VERIFIED WITHGwenith Spitz RN 1610 09/02/16 A BROWNING     Radiology Reports US Paracentesis  Result Date: 09/02/2016 INDICATION: Decompensated alcoholic cirrhosis. Altered mental status. Hypotension. Acute kidney injury. Recurrent ascites. Request diagnostic and therapeutic paracentesis. EXAM: ULTRASOUND GUIDED RIGHT LOWER QUADRANT PARACENTESIS MEDICATIONS: None. COMPLICATIONS: None immediate. PROCEDURE: Informed written consent was obtained from the patient  after a discussion of the risks, benefits and alternatives to treatment. A timeout was performed prior to the initiation of the procedure. Initial ultrasound scanning demonstrates a large amount of ascites within the right lower abdominal quadrant. The right lower abdomen was prepped and draped in the usual sterile fashion. 1% lidocaine with epinephrine was used for local anesthesia. Following this, a 19 gauge, 7-cm, Yueh catheter was introduced. An ultrasound image was saved for documentation purposes. The paracentesis was performed. The catheter was removed and a dressing was applied. The  patient tolerated the procedure well without immediate post procedural complication. FINDINGS: A total of approximately 2.5 L of clear yellow fluid was removed. Procedure was terminated at this time due to hypotension. Samples were sent to the laboratory as requested by the clinical team. IMPRESSION: Successful ultrasound-guided paracentesis yielding 2.5 liters of peritoneal fluid. Read by: Brayton El PA-C Electronically Signed   By: Simonne Come M.D.   On: 09/02/2016 11:55   US Paracentesis  Result Date: 08/26/2016 INDICATION: Alcoholic cirrhosis, recurrent ascites. Request made for therapeutic paracentesis. EXAM: ULTRASOUND GUIDED THERAPEUTIC PARACENTESIS MEDICATIONS: None. COMPLICATIONS: None immediate. PROCEDURE: Informed written consent was obtained from the patient after a discussion of the risks, benefits and alternatives to treatment. A timeout was performed prior to the initiation of the procedure. Initial ultrasound scanning demonstrates a large amount of ascites within the right lower abdominal quadrant. The right lower abdomen was prepped and draped in the usual sterile fashion. 1% lidocaine was used for local anesthesia. Following this, a Yueh catheter was introduced. An ultrasound image was saved for documentation purposes. The paracentesis was performed. The catheter was removed and a dressing was applied.  The patient tolerated the procedure well without immediate post procedural complication. FINDINGS: A total of approximately 9.3 liters of yellow fluid was removed. IMPRESSION: Successful ultrasound-guided therapeutic paracentesis yielding 9.3 liters of peritoneal fluid. Read by: Jeananne Rama, PA-C Electronically Signed   By: Irish Lack M.D.   On: 08/26/2016 11:05   US Paracentesis  Result Date: 08/13/2016 INDICATION: Alcoholic cirrhosis, recurrent ascites. Request made for therapeutic paracentesis. EXAM: ULTRASOUND GUIDED THERAPEUTIC PARACENTESIS MEDICATIONS: None. COMPLICATIONS: None immediate. PROCEDURE: Informed written consent was obtained from the patient after a discussion of the risks, benefits and alternatives to treatment. A timeout was performed prior to the initiation of the procedure. Initial ultrasound scanning demonstrates a large amount of ascites within the right lower abdominal quadrant. The right lower abdomen was prepped and draped in the usual sterile fashion. 1% lidocaine was used for local anesthesia. Following this, a Yueh catheter was introduced. An ultrasound image was saved for documentation purposes. The paracentesis was performed. The catheter was removed and a dressing was applied. The patient tolerated the procedure well without immediate post procedural complication. FINDINGS: A total of approximately 7.2 liters of yellow fluid was removed. IMPRESSION: Successful ultrasound-guided therapeutic paracentesis yielding 7.2 liters of peritoneal fluid. Only the above amount of fluid was removed today secondary to pt hypotension. Read by: Jeananne Rama, PA-C Electronically Signed   By: Gilmer Mor D.O.   On: 08/13/2016 13:14   Dg Chest Portable 1 View  Result Date: 09/01/2016 CLINICAL DATA:  Altered mental status. Abdominal distention. Bradypnea. EXAM: PORTABLE CHEST 1 VIEW COMPARISON:  Chest radiographs 07/21/2016 FINDINGS: Low lung volumes. Patient's hand partially obscures  evaluation of the left lung base. Improved left basilar aeration with probable residual small volume pleural fluid. Streaky bibasilar opacities likely atelectasis. Normal heart size. Unchanged mediastinal contours. No pneumothorax. No acute osseous abnormality. IMPRESSION: Low lung volumes with small left pleural effusion, decreased in size from radiographs 6 weeks prior. Streaky bibasilar atelectasis. Electronically Signed   By: Rubye Oaks M.D.   On: 09/01/2016 21:20    Time Spent in minutes  30   Pearson Grippe M.D on 09/04/2016 at 8:12 AM  Between 7am to 7pm - Pager - [phone]  After 7pm go to [email or web address] - password [facility]  [facility] -  Office  [phone]

## 2016-09-04 NOTE — Progress Notes (Signed)
Palliative Care Progress Note  Reason for encounter: Goals of care in light of End [place] liver disease and shock  I met today with patient's husband.  Christine [name] is able to participate some in conversation, but is unable to discuss decision making.  Her husband remains encouraged by the fact she has been able to talk with him.  I discussed with her husband her clinical course over the past 24 hours,  Including d/c of abx and initiation of midodrine.  We discussed that her worsening may be due to progression of her liver disease rather than a "fixable" problem such as an infection.  We discussed goal of her improving in the short term, but also the fact that she will still have underlying liver disease and complications from this will continue to occur in the future.  Her husband would like to continue current plan of gentle medical therapies (abx, fluids, medications) but no aggressive interventions (intubation, central line, dialysis) if she continues to decline.  In the event of decompensation, overarching goal will be for her comfort.  We discussed again that medications should not be held due to concerns over hypotension if she is in pain or suffering.  - Her husband understands that she is critically ill. Plan to continue current therapy. No aggressive interventions in the event of decompensation.  - If she continues to worsen, goal will be to focus on comfort. - Medications for pain or shortness of breath should not be held due to concerns for hypotension if needed to ensure her comfort. I specifically discussed this risk with her husband and he is in agreement. - Will continue to follow.  Time Spent: 25 minutes  Greater than 50%  of this time was spent counseling and coordinating care related to the above assessment and plan.   Micheline Rough, MD [place] [facility] [phone]

## 2016-09-04 NOTE — Progress Notes (Signed)
Palliative Care Progress Note  Reason for encounter: Goals of care in light of [place] liver disease and shock  I met today with patient's husband and sister.  Ms. Galeana is more interactive today and will open her eyes and acknowledge my presence, but she is still not able to participate in conversation or decision making.  Her husband reports being very encouraged by the fact she has been able to talk with him.  I discussed with her husband and sister her clinical course over the past 24 hours and continued concern that she remains critically ill.  We discussed goal of her improving in the short term, but also the fact that she will still have underlying liver disease and complications from this will continue to occur in the future.  Her husband would like to continue current plan of gentle medical therapies (abx, fluids, medications) but no aggressive interventions (intubation, central line, dialysis) if she continues to decline.  In the event of decompensation, overarching goal will be for her comfort.  We discussed again that medications should not be held due to concerns over hypotension if she is in pain or suffering.  - Her husband understands that she is critically ill and she may be approaching the end of her life. Plan to continue current therapy for another 24-48 hours. No aggressive interventions in the event of decompensation.  - If she continues to worsen, goal will be to focus on comfort. - Medications for pain or shortness of breath should not be held due to concerns for hypotension if needed to ensure her comfort. I specifically discussed this risk with her husband and he is in agreement. - Will continue to follow.  Time Spent: 40 minutes  Micheline Rough, MD [place] [facility] [phone]

## 2016-09-05 LAB — COMPREHENSIVE METABOLIC PANEL
ALT: 18 U/L (ref 14–54)
ANION GAP: 11 (ref 5–15)
AST: 40 U/L (ref 15–41)
Albumin: 1.5 g/dL — ABNORMAL LOW (ref 3.5–5.0)
Alkaline Phosphatase: 75 U/L (ref 38–126)
BUN: 81 mg/dL — ABNORMAL HIGH (ref 6–20)
CHLORIDE: 110 mmol/L (ref 101–111)
CO2: 10 mmol/L — ABNORMAL LOW (ref 22–32)
CREATININE: 1.63 mg/dL — AB (ref 0.44–1.00)
Calcium: 7.7 mg/dL — ABNORMAL LOW (ref 8.9–10.3)
GFR, EST AFRICAN AMERICAN: 40 mL/min — AB (ref >60)
GFR, EST NON AFRICAN AMERICAN: 34 mL/min — AB (ref >60)
Glucose, Bld: 101 mg/dL — ABNORMAL HIGH (ref 65–99)
POTASSIUM: 4.9 mmol/L (ref 3.5–5.1)
Sodium: 131 mmol/L — ABNORMAL LOW (ref 135–145)
Total Bilirubin: 1 mg/dL (ref 0.3–1.2)
Total Protein: 5.3 g/dL — ABNORMAL LOW (ref 6.5–8.1)

## 2016-09-05 LAB — CBC
HCT: 30.7 % — ABNORMAL LOW (ref 36.0–46.0)
Hemoglobin: 10.2 g/dL — ABNORMAL LOW (ref 12.0–15.0)
MCH: 29.1 pg (ref 26.0–34.0)
MCHC: 33.2 g/dL (ref 30.0–36.0)
MCV: 87.5 fL (ref 78.0–100.0)
PLATELETS: 249 10*3/uL (ref 150–400)
RBC: 3.51 MIL/uL — ABNORMAL LOW (ref 3.87–5.11)
RDW: 16.5 % — AB (ref 11.5–15.5)
WBC: 15.3 10*3/uL — ABNORMAL HIGH (ref 4.0–10.5)

## 2016-09-05 MED ORDER — LORAZEPAM 2 MG/ML IJ SOLN
0.5000 mg | INTRAMUSCULAR | Status: DC | PRN
  Administered 2016-09-06 – 2016-09-09 (×6): 1 mg via INTRAVENOUS
  Filled 2016-09-05 (×7): qty 1

## 2016-09-05 MED ORDER — MIDODRINE HCL 5 MG PO TABS
10.0000 mg | ORAL_TABLET | Freq: Three times a day (TID) | ORAL | Status: DC
  Administered 2016-09-05 – 2016-09-11 (×17): 10 mg via ORAL
  Filled 2016-09-05 (×17): qty 2

## 2016-09-05 MED ORDER — PROMETHAZINE HCL 25 MG/ML IJ SOLN
6.2500 mg | Freq: Once | INTRAMUSCULAR | Status: AC
  Administered 2016-09-05: 6.25 mg via INTRAVENOUS
  Filled 2016-09-05: qty 1

## 2016-09-05 NOTE — Progress Notes (Signed)
09/05/2016 Patient have 450cc urine at the end of first shift. Dr Toniann FailKakrakandy was made aware. Patient have gross acites and swelling in periarea. Order was given to do bladder scan and if patient have more than to 200cc to call MD. Lovie MacadamiaNadine Kazzandra Desaulniers RN.

## 2016-09-05 NOTE — Progress Notes (Signed)
09/05/2016 Nurse tech did bladder scan at 1955 and reported patient had 200cc in bladder. [facility] Center[facility]ad[facility] Zohaib Heeney RN.

## 2016-09-05 NOTE — Progress Notes (Signed)
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  Subjective: The patient will answer some questions but remains somewhat lethargic.  She is more interactive today than she was when I saw her 2 days ago.  She denies chest pain but does report some abdominal pain due to distention.  She denies nausea or vomiting.  A good friend at the bedside states that she's had minimal oral intake.  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  - suspect this is a contaminant - discontinued antibiotics - follow clinically  Goals of care - Husband wishes to continue conservative medical care (see Palliative Care notes) - no ongoing indication for SDU care or tele monitoring - transfer to med bed and follow   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 - continue high dose lactulose - f/u ammonia in AM   Hyponatremia - in the setting of fluid overload/liver disease - cannot diurese due to hypotension - follow trend - relatively stable presently   Recent Labs Lab 09/01/16 2004 09/02/16 0217 09/03/16 0358 09/04/16 0428 09/05/16 0307  NA 131* 130* 133* 136 131*    Hyperkalemia - improving w/ increased stool output - follow   Recent Labs Lab 09/01/16 2004 09/02/16 0217 09/03/16 0358 09/04/16 0428 09/05/16 0307  K 5.7* 5.4* 5.5* 5.4* 4.9    Acute kidney  injury - renal fxn possibly improving - cont to follow trend - avoid nephrotoxic meds   Recent Labs Lab 09/01/16 2004 09/02/16 0217 09/03/16 0358 09/04/16 0428 09/05/16 0307  CREATININE 2.12* 2.15* 2.00* 1.90* 1.63*   Anemia - of chronic liver disease  MRSA screen +   DVT prophylaxis: SCDs Code Status: DNR - NO CODE Family Communication: spoke w/ close family friend at bedside w/ pt's blessing Disposition Plan: transfer to medical bed - eventual return to SNF when hypotension improved and ammonia better controlled, or for hospice care should she decline further   Consultants:  Palliative Care   Procedures: 1/30 paracentesis   Antimicrobials:  Vancomycin 1/29 > 1/31 Zosyn 1/29 > 1/31  Objective: Blood pressure (!) 89/41, pulse 96, temperature 97.8 F (36.6 C), temperature source Oral, resp. rate 14, height 5\' 4"  (1.626 m), weight 69.1 kg (152 lb 5.4 oz), SpO2 100 %.  Intake/Output Summary (Last 24 hours) at 09/05/16 1335 Last data filed at 09/05/16 0900  Gross per 24 hour  Intake              329 ml  Output                0 ml  Net              329 ml   Filed Weights   09/02/16 1515  Weight: 69.1 kg (152 lb 5.4 oz)    Examination: General: No acute respiratory distress - somnolent   Lungs: Clear to  auscultation bilaterally - no wheezing  Cardiovascular: Regular rate and rhythm without murmur gallop or rub  Abdomen: protuberant/distended, soft, mildly tender to deep palpation diffusely, bowel sounds hypoactive Extremities: 2+ pitting edema bilateral lower extremities  CBC:  Recent Labs Lab 09/01/16 2004 09/02/16 0217 09/03/16 0358 09/04/16 0428 09/05/16 0307  WBC 9.0 7.1 9.7 14.8* 15.3*  NEUTROABS 6.4  --   --   --   --   HGB 8.0* 7.5* 9.4* 10.4* 10.2*  HCT 22.9* 21.5* 27.2* 30.0* 30.7*  MCV 84.8 84.6 85.3 85.7 87.5  PLT 183 169 186 225 249   Basic Metabolic Panel:  Recent Labs Lab 09/01/16 2004 09/02/16 0217 09/03/16 0358 09/04/16 0428  09/05/16 0307  NA 131* 130* 133* 136 131*  K 5.7* 5.4* 5.5* 5.4* 4.9  CL 107 110 113* 116* 110  CO2 15* 13* 15* 10* 10*  GLUCOSE 101* 95 69 104* 101*  BUN 101* 96* 91* 86* 81*  CREATININE 2.12* 2.15* 2.00* 1.90* 1.63*  CALCIUM 7.0* 7.0* 7.2* 7.8* 7.7*   GFR: Estimated Creatinine Clearance: 36.8 mL/min (by C-G formula based on SCr of 1.63 mg/dL (H)).  Liver Function Tests:  Recent Labs Lab 09/01/16 2004 09/02/16 0217 09/03/16 0358 09/04/16 0428 09/05/16 0307  AST 28 25 29  37 40  ALT 14 12* 14 16 18   ALKPHOS 83 65 65 66 75  BILITOT 1.3* 1.4* 1.5* 1.4* 1.0  PROT 5.0* 5.1* 5.0* 5.4* 5.3*  ALBUMIN 1.2* 1.8* 1.5* 1.5* 1.5*    Recent Labs Lab 09/01/16 2004 09/04/16 0428  AMMONIA 150* 75*    Coagulation Profile:  Recent Labs Lab 09/03/16 1832  INR 1.68    CBG:  Recent Labs Lab 09/01/16 1941 09/02/16 2314 09/03/16 0804 09/03/16 1042 09/03/16 1912  GLUCAP 99 83 64* 112* 108*    Recent Results (from the past 240 hour(s))  Blood culture (routine x 2)     Status: None (Preliminary result)   Collection Time: 09/01/16  7:30 PM  Result Value Ref Range Status   Specimen Description BLOOD LEFT HAND  Final   Special Requests BOTTLES DRAWN AEROBIC ONLY 7CC  Final   Culture NO GROWTH 3 DAYS  Final   Report Status PENDING  Incomplete  Blood culture (routine x 2)     Status: Abnormal   Collection Time: 09/01/16  8:07 PM  Result Value Ref Range Status   Specimen Description BLOOD RIGHT HAND  Final   Special Requests IN PEDIATRIC BOTTLE 5CC  Final   Culture  Setup Time   Final    GRAM POSITIVE COCCI IN CLUSTERS IN PEDIATRIC BOTTLE CRITICAL RESULT CALLED TO, READ BACK BY AND VERIFIED WITH: ADagoberto Ligas.D. 17:50 09/02/16 ([email or web address])    Culture (A)  Final    STAPHYLOCOCCUS SPECIES (COAGULASE NEGATIVE) THE SIGNIFICANCE OF ISOLATING THIS ORGANISM FROM A SINGLE SET OF BLOOD CULTURES WHEN MULTIPLE SETS ARE DRAWN IS UNCERTAIN. PLEASE NOTIFY THE MICROBIOLOGY DEPARTMENT  WITHIN ONE WEEK IF SPECIATION AND SENSITIVITIES ARE REQUIRED.    Report Status 09/03/2016 FINAL  Final  Blood Culture ID Panel (Reflexed)     Status: Abnormal   Collection Time: 09/01/16  8:07 PM  Result Value Ref Range Status   Enterococcus species NOT DETECTED NOT DETECTED Final   Listeria monocytogenes NOT DETECTED NOT DETECTED Final   Staphylococcus species DETECTED (A) NOT DETECTED Final    Comment: Methicillin (oxacillin) resistant coagulase negative staphylococcus. Possible blood culture contaminant (unless isolated from more than one blood culture draw or clinical  case suggests pathogenicity). No antibiotic treatment is indicated for blood  culture contaminants. CRITICAL RESULT CALLED TO, READ BACK BY AND VERIFIED WITH: ADagoberto Ligas.D. 17:50 09/02/16 ([email or web address])    Staphylococcus aureus NOT DETECTED NOT DETECTED Final   Methicillin resistance DETECTED (A) NOT DETECTED Final    Comment: CRITICAL RESULT CALLED TO, READ BACK BY AND VERIFIED WITH: ADagoberto Ligas.D. 17:50 09/02/16 ([email or web address])    Streptococcus species NOT DETECTED NOT DETECTED Final   Streptococcus agalactiae NOT DETECTED NOT DETECTED Final   Streptococcus pneumoniae NOT DETECTED NOT DETECTED Final   Streptococcus pyogenes NOT DETECTED NOT DETECTED Final   Acinetobacter baumannii NOT DETECTED NOT DETECTED Final   Enterobacteriaceae species NOT DETECTED NOT DETECTED Final   Enterobacter cloacae complex NOT DETECTED NOT DETECTED Final   Escherichia coli NOT DETECTED NOT DETECTED Final   Klebsiella oxytoca NOT DETECTED NOT DETECTED Final   Klebsiella pneumoniae NOT DETECTED NOT DETECTED Final   Proteus species NOT DETECTED NOT DETECTED Final   Serratia marcescens NOT DETECTED NOT DETECTED Final   Haemophilus influenzae NOT DETECTED NOT DETECTED Final   Neisseria meningitidis NOT DETECTED NOT DETECTED Final   Pseudomonas aeruginosa NOT DETECTED NOT DETECTED Final   Candida albicans NOT DETECTED NOT DETECTED Final    Candida glabrata NOT DETECTED NOT DETECTED Final   Candida krusei NOT DETECTED NOT DETECTED Final   Candida parapsilosis NOT DETECTED NOT DETECTED Final   Candida tropicalis NOT DETECTED NOT DETECTED Final  Culture, body fluid-bottle     Status: None (Preliminary result)   Collection Time: 09/02/16 11:49 AM  Result Value Ref Range Status   Specimen Description FLUID PERITONEAL  Final   Special Requests NONE  Final   Culture NO GROWTH 2 DAYS  Final   Report Status PENDING  Incomplete  Gram stain     Status: None   Collection Time: 09/02/16 11:49 AM  Result Value Ref Range Status   Specimen Description FLUID PERITONEAL  Final   Special Requests NONE  Final   Gram Stain   Final    RARE WBC PRESENT, PREDOMINANTLY PMN NO ORGANISMS SEEN    Report Status 09/02/2016 FINAL  Final  MRSA PCR Screening     Status: Abnormal   Collection Time: 09/02/16  3:27 PM  Result Value Ref Range Status   MRSA by PCR POSITIVE (A) NEGATIVE Final    Comment:        The GeneXpert MRSA Assay (FDA approved for NASAL specimens only), is one component of a comprehensive MRSA colonization surveillance program. It is not intended to diagnose MRSA infection nor to guide or monitor treatment for MRSA infections. RESULT CALLED TO, READ BACK BY AND VERIFIED WITHGwenith Spitz RN 1610 09/02/16 A BROWNING      Scheduled Meds: . Chlorhexidine Gluconate Cloth  6 each Topical Q0600  . lactulose  30 g Oral TID  . mouth rinse  15 mL Mouth Rinse BID  . midodrine  5 mg Oral TID WC  . mupirocin ointment  1 application Nasal BID  . sodium chloride flush  3 mL Intravenous Q12H     LOS: 4 days   [name], MD [facility] Office  [phone] Pager - Text Page per Loretha Stapler as per below:  On-Call/Text Page:      [email or web address]      password [email or web address]  If 7PM-7AM, please contact night-coverage [email or web address] Password [email or web address] 09/05/2016, 1:35 PM

## 2016-09-05 NOTE — Care Management Note (Signed)
Case Management Note  Patient Details  Name: Christine NamJanet M [name] MRN: [id number] Date of Birth: 1961/03/20  Subjective/Objective:   [facility]  Palliative Care Team meeting with pt and husband, he wants to continue care and focus on comfort if her condition continues to deteriorate.  BPs drop into 70s systolic, attending increasing midodrine dosage.                                 Expected Discharge Plan:  Skilled Nursing Facility  In-House Referral:  Clinical Social Work  Discharge planning Services  CM Consult  Status of Service:  In process, will continue to follow  Magdalene RiverMayo, Suleika Donavan T, RN 09/05/2016, 3:40 PM

## 2016-09-06 DIAGNOSIS — D638 Anemia in other chronic diseases classified elsewhere: Secondary | ICD-10-CM

## 2016-09-06 DIAGNOSIS — N179 Acute kidney failure, unspecified: Secondary | ICD-10-CM

## 2016-09-06 DIAGNOSIS — R579 Shock, unspecified: Secondary | ICD-10-CM

## 2016-09-06 DIAGNOSIS — K7031 Alcoholic cirrhosis of liver with ascites: Secondary | ICD-10-CM

## 2016-09-06 LAB — AMMONIA: Ammonia: 27 umol/L (ref 9–35)

## 2016-09-06 LAB — COMPREHENSIVE METABOLIC PANEL
ALK PHOS: 81 U/L (ref 38–126)
ALT: 29 U/L (ref 14–54)
AST: 57 U/L — ABNORMAL HIGH (ref 15–41)
Albumin: 1.4 g/dL — ABNORMAL LOW (ref 3.5–5.0)
Anion gap: 7 (ref 5–15)
BILIRUBIN TOTAL: 1.2 mg/dL (ref 0.3–1.2)
BUN: 65 mg/dL — ABNORMAL HIGH (ref 6–20)
CALCIUM: 7.8 mg/dL — AB (ref 8.9–10.3)
CO2: 14 mmol/L — AB (ref 22–32)
CREATININE: 1.19 mg/dL — AB (ref 0.44–1.00)
Chloride: 110 mmol/L (ref 101–111)
GFR calc non Af Amer: 50 mL/min — ABNORMAL LOW (ref >60)
GFR, EST AFRICAN AMERICAN: 58 mL/min — AB (ref >60)
GLUCOSE: 114 mg/dL — AB (ref 65–99)
Potassium: 3.9 mmol/L (ref 3.5–5.1)
SODIUM: 131 mmol/L — AB (ref 135–145)
Total Protein: 5 g/dL — ABNORMAL LOW (ref 6.5–8.1)

## 2016-09-06 LAB — CULTURE, BLOOD (ROUTINE X 2): CULTURE: NO GROWTH

## 2016-09-06 MED ORDER — ENSURE ENLIVE PO LIQD
237.0000 mL | Freq: Two times a day (BID) | ORAL | Status: DC
  Administered 2016-09-07 – 2016-09-11 (×7): 237 mL via ORAL

## 2016-09-06 MED ORDER — HEPARIN SODIUM (PORCINE) 5000 UNIT/ML IJ SOLN
5000.0000 [IU] | Freq: Three times a day (TID) | INTRAMUSCULAR | Status: DC
  Administered 2016-09-06 – 2016-09-10 (×12): 5000 [IU] via SUBCUTANEOUS
  Filled 2016-09-06 (×12): qty 1

## 2016-09-06 NOTE — Progress Notes (Signed)
[facility]                                                                              Patient Demographics  Christine [name], is a 56 y.o. female, DOB - Jun 16, 1961, ZOX:[id number]  Admit date - 09/01/2016   Admitting Physician Alberteen Sam, MD  Outpatient Primary MD for the patient is [name], MD  Outpatient specialists:   LOS - 5  days    Chief Complaint  Patient presents with  . Altered Mental Status       Brief summary   56 y.o.femalewith a history of decompensated alcoholic cirrhosis, Hepatic encephalopathy, ascites who presented with altered mental status and lethargy, and was found to be in shock with suspected sepsis.   Assessment & Plan    Principal diagnoses  Shock - Hypotension  - In the setting of decompensated alcoholic cirrhosis, hepatic encephalopathy and ascites, end-stage liver disease. 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 - Medications for pain or shortness of breath not to be held due to concerns for hypotension if needed to ensure her comfort.  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 - continue high dose lactulose  - Ammonia level improving, patient is alert and oriented   Hyponatremia - in the setting of fluid overload/liver disease -Sodium level stable,  cannot diurese due to  hypotension  Hyperkalemia - Improved with increased stool output  Acute kidney injury -Renal function improved, continue to follow trend - avoid nephrotoxic meds   Anemia - of chronic liver disease, H&H stable  Urinary retention overnight - Foley catheter placed, bladder scan at 1955 had 200 mL in bladder   Code Status: DO NOT RESUSCITATE status DVT Prophylaxis: SCDs Family Communication: Discussed in detail with the patient, all imaging results, lab results explained to the patient    Disposition Plan: Eventual return to skilled nursing facility, hopefully Monday  Time Spent in minutes 25 minutes  Procedures:  Paracentesis 1/30  Consultants:   Palliative medicine  Antimicrobials:  Vancomycin 1/29 > 1/31 Zosyn 1/29 > 1/31   Medications  Scheduled Meds: . Chlorhexidine Gluconate Cloth  6 each Topical Q0600  . lactulose  30 g Oral TID  . mouth rinse  15 mL Mouth Rinse BID  . midodrine  10 mg Oral TID WC  . mupirocin ointment  1 application Nasal BID  . sodium chloride flush  3 mL Intravenous Q12H   Continuous Infusions: PRN Meds:.fentaNYL (SUBLIMAZE) injection, LORazepam   Antibiotics   Anti-infectives    Start     Dose/Rate Route Frequency  Ordered Stop   09/02/16 2300  vancomycin (VANCOCIN) IVPB 750 mg/150 ml premix  Status:  Discontinued     750 mg 150 mL/hr over 60 Minutes Intravenous Every 24 hours 09/01/16 2240 09/03/16 1653   09/02/16 0700  piperacillin-tazobactam (ZOSYN) IVPB 3.375 g  Status:  Discontinued     3.375 g 12.5 mL/hr over 240 Minutes Intravenous Every 8 hours 09/01/16 2240 09/03/16 1653   09/01/16 2245  piperacillin-tazobactam (ZOSYN) IVPB 3.375 g     3.375 g 100 mL/hr over 30 Minutes Intravenous  Once 09/01/16 2236 09/01/16 2324   09/01/16 2245  vancomycin (VANCOCIN) IVPB 1000 mg/200 mL premix     1,000 mg 200 mL/hr over 60 Minutes Intravenous  Once 09/01/16 2236 09/02/16 0036        Subjective:   Christine [name] was seen and  examined today.  Alert and oriented, wants to sleep as she did not sleep too well last night. Abdominal pain controlled, has distention. No fevers. Currently no shortness of breath. Patient denies dizziness, chest pain, N/V/D/C, new weakness, numbess, tingling.   Objective:   Vitals:   09/05/16 1926 09/05/16 2011 09/05/16 2355 09/06/16 0439  BP: (!) 108/58  (!) 104/58 103/64  Pulse: (!) 102 100 (!) 102 (!) 101  Resp: 15 16 19 20   Temp: 98.7 F (37.1 C)  98.2 F (36.8 C) 98.7 F (37.1 C)  TempSrc: Axillary     SpO2: 100% 100% 100% 100%  Weight:   69.3 kg (152 lb 12.5 oz)   Height:   5\' 4"  (1.626 m)     Intake/Output Summary (Last 24 hours) at 09/06/16 1021 Last data filed at 09/06/16 0657  Gross per 24 hour  Intake              240 ml  Output             1090 ml  Net             -850 ml     Wt Readings from Last 3 Encounters:  09/05/16 69.3 kg (152 lb 12.5 oz)  08/29/16 78 kg (172 lb)  08/25/16 78 kg (172 lb)     Exam  General: Alert and oriented x 3, NAD  HEENT:    Neck:   Cardiovascular: S1 S2 auscultated, no rubs, murmurs or gallops. Regular rate and rhythm.  Respiratory: Clear to auscultation bilaterally, no wheezing, rales or rhonchi  Gastrointestinal: Soft, nontender, distended, + bowel sounds  Ext: no cyanosis clubbing, 2+ edema  Neuro: AAOx3, Cr N's II- XII. Strength 5/5 upper and lower extremities bilaterally  Skin: No rashes  Psych: Normal affect and demeanor, alert and oriented x3    Data Reviewed:  I have personally reviewed following labs and imaging studies  Micro Results Recent Results (from the past 240 hour(s))  Blood culture (routine x 2)     Status: None (Preliminary result)   Collection Time: 09/01/16  7:30 PM  Result Value Ref Range Status   Specimen Description BLOOD LEFT HAND  Final   Special Requests BOTTLES DRAWN AEROBIC ONLY 7CC  Final   Culture NO GROWTH 4 DAYS  Final   Report Status PENDING  Incomplete  Blood culture  (routine x 2)     Status: Abnormal   Collection Time: 09/01/16  8:07 PM  Result Value Ref Range Status   Specimen Description BLOOD RIGHT HAND  Final   Special Requests IN PEDIATRIC BOTTLE 5CC  Final   Culture  Setup Time  Final    GRAM POSITIVE COCCI IN CLUSTERS IN PEDIATRIC BOTTLE CRITICAL RESULT CALLED TO, READ BACK BY AND VERIFIED WITH: ADagoberto Ligas.D. 17:50 09/02/16 ([email or web address])    Culture (A)  Final    STAPHYLOCOCCUS SPECIES (COAGULASE NEGATIVE) THE SIGNIFICANCE OF ISOLATING THIS ORGANISM FROM A SINGLE SET OF BLOOD CULTURES WHEN MULTIPLE SETS ARE DRAWN IS UNCERTAIN. PLEASE NOTIFY THE MICROBIOLOGY DEPARTMENT WITHIN ONE WEEK IF SPECIATION AND SENSITIVITIES ARE REQUIRED.    Report Status 09/03/2016 FINAL  Final  Blood Culture ID Panel (Reflexed)     Status: Abnormal   Collection Time: 09/01/16  8:07 PM  Result Value Ref Range Status   Enterococcus species NOT DETECTED NOT DETECTED Final   Listeria monocytogenes NOT DETECTED NOT DETECTED Final   Staphylococcus species DETECTED (A) NOT DETECTED Final    Comment: Methicillin (oxacillin) resistant coagulase negative staphylococcus. Possible blood culture contaminant (unless isolated from more than one blood culture draw or clinical case suggests pathogenicity). No antibiotic treatment is indicated for blood  culture contaminants. CRITICAL RESULT CALLED TO, READ BACK BY AND VERIFIED WITH: ADagoberto Ligas.D. 17:50 09/02/16 (wilsonm)    Staphylococcus aureus NOT DETECTED NOT DETECTED Final   Methicillin resistance DETECTED (A) NOT DETECTED Final    Comment: CRITICAL RESULT CALLED TO, READ BACK BY AND VERIFIED WITH: ADagoberto Ligas.D. 17:50 09/02/16 (wilsonm)    Streptococcus species NOT DETECTED NOT DETECTED Final   Streptococcus agalactiae NOT DETECTED NOT DETECTED Final   Streptococcus pneumoniae NOT DETECTED NOT DETECTED Final   Streptococcus pyogenes NOT DETECTED NOT DETECTED Final   Acinetobacter baumannii NOT DETECTED NOT  DETECTED Final   Enterobacteriaceae species NOT DETECTED NOT DETECTED Final   Enterobacter cloacae complex NOT DETECTED NOT DETECTED Final   Escherichia coli NOT DETECTED NOT DETECTED Final   Klebsiella oxytoca NOT DETECTED NOT DETECTED Final   Klebsiella pneumoniae NOT DETECTED NOT DETECTED Final   Proteus species NOT DETECTED NOT DETECTED Final   Serratia marcescens NOT DETECTED NOT DETECTED Final   Haemophilus influenzae NOT DETECTED NOT DETECTED Final   Neisseria meningitidis NOT DETECTED NOT DETECTED Final   Pseudomonas aeruginosa NOT DETECTED NOT DETECTED Final   Candida albicans NOT DETECTED NOT DETECTED Final   Candida glabrata NOT DETECTED NOT DETECTED Final   Candida krusei NOT DETECTED NOT DETECTED Final   Candida parapsilosis NOT DETECTED NOT DETECTED Final   Candida tropicalis NOT DETECTED NOT DETECTED Final  Culture, body fluid-bottle     Status: None (Preliminary result)   Collection Time: 09/02/16 11:49 AM  Result Value Ref Range Status   Specimen Description FLUID PERITONEAL  Final   Special Requests NONE  Final   Culture NO GROWTH 3 DAYS  Final   Report Status PENDING  Incomplete  Gram stain     Status: None   Collection Time: 09/02/16 11:49 AM  Result Value Ref Range Status   Specimen Description FLUID PERITONEAL  Final   Special Requests NONE  Final   Gram Stain   Final    RARE WBC PRESENT, PREDOMINANTLY PMN NO ORGANISMS SEEN    Report Status 09/02/2016 FINAL  Final  MRSA PCR Screening     Status: Abnormal   Collection Time: 09/02/16  3:27 PM  Result Value Ref Range Status   MRSA by PCR POSITIVE (A) NEGATIVE Final    Comment:        The GeneXpert MRSA Assay (FDA approved for NASAL specimens only), is one component of a comprehensive MRSA colonization surveillance program. It is  not intended to diagnose MRSA infection nor to guide or monitor treatment for MRSA infections. RESULT CALLED TO, READ BACK BY AND VERIFIED WITHGwenith Spitz: R SAWYER RN [id number] 09/02/16 A  BROWNING     Radiology Reports [place] Paracentesis  Result Date: 09/02/2016 INDICATION: Decompensated alcoholic cirrhosis. Altered mental status. Hypotension. Acute kidney injury. Recurrent ascites. Request diagnostic and therapeutic paracentesis. EXAM: ULTRASOUND GUIDED RIGHT LOWER QUADRANT PARACENTESIS MEDICATIONS: None. COMPLICATIONS: None immediate. PROCEDURE: Informed written consent was obtained from the patient after a discussion of the risks, benefits and alternatives to treatment. A timeout was performed prior to the initiation of the procedure. Initial ultrasound scanning demonstrates a large amount of ascites within the right lower abdominal quadrant. The right lower abdomen was prepped and draped in the usual sterile fashion. 1% lidocaine with epinephrine was used for local anesthesia. Following this, a 19 gauge, 7-cm, Yueh catheter was introduced. An ultrasound image was saved for documentation purposes. The paracentesis was performed. The catheter was removed and a dressing was applied. The patient tolerated the procedure well without immediate post procedural complication. FINDINGS: A total of approximately 2.5 L of clear yellow fluid was removed. Procedure was terminated at this time due to hypotension. Samples were sent to the laboratory as requested by the clinical team. IMPRESSION: Successful ultrasound-guided paracentesis yielding 2.5 liters of peritoneal fluid. Read by: Brayton ElKevin Bruning PA-C Electronically Signed   By: Simonne ComeJohn  Watts M.D.   On: 09/02/2016 11:55   [place] Paracentesis  Result Date: 08/26/2016 INDICATION: Alcoholic cirrhosis, recurrent ascites. Request made for therapeutic paracentesis. EXAM: ULTRASOUND GUIDED THERAPEUTIC PARACENTESIS MEDICATIONS: None. COMPLICATIONS: None immediate. PROCEDURE: Informed written consent was obtained from the patient after a discussion of the risks, benefits and alternatives to treatment. A timeout was performed prior to the initiation of the procedure.  Initial ultrasound scanning demonstrates a large amount of ascites within the right lower abdominal quadrant. The right lower abdomen was prepped and draped in the usual sterile fashion. 1% lidocaine was used for local anesthesia. Following this, a Yueh catheter was introduced. An ultrasound image was saved for documentation purposes. The paracentesis was performed. The catheter was removed and a dressing was applied. The patient tolerated the procedure well without immediate post procedural complication. FINDINGS: A total of approximately 9.3 liters of yellow fluid was removed. IMPRESSION: Successful ultrasound-guided therapeutic paracentesis yielding 9.3 liters of peritoneal fluid. Read by: Jeananne RamaKevin Allred, PA-C Electronically Signed   By: Irish LackGlenn  Yamagata M.D.   On: 08/26/2016 11:05   [place] Paracentesis  Result Date: 08/13/2016 INDICATION: Alcoholic cirrhosis, recurrent ascites. Request made for therapeutic paracentesis. EXAM: ULTRASOUND GUIDED THERAPEUTIC PARACENTESIS MEDICATIONS: None. COMPLICATIONS: None immediate. PROCEDURE: Informed written consent was obtained from the patient after a discussion of the risks, benefits and alternatives to treatment. A timeout was performed prior to the initiation of the procedure. Initial ultrasound scanning demonstrates a large amount of ascites within the right lower abdominal quadrant. The right lower abdomen was prepped and draped in the usual sterile fashion. 1% lidocaine was used for local anesthesia. Following this, a Yueh catheter was introduced. An ultrasound image was saved for documentation purposes. The paracentesis was performed. The catheter was removed and a dressing was applied. The patient tolerated the procedure well without immediate post procedural complication. FINDINGS: A total of approximately 7.2 liters of yellow fluid was removed. IMPRESSION: Successful ultrasound-guided therapeutic paracentesis yielding 7.2 liters of peritoneal fluid. Only the above  amount of fluid was removed today secondary to pt hypotension. Read by: Jeananne RamaKevin Allred, PA-C Electronically Signed  By: Gilmer Mor D.O.   On: 08/13/2016 13:14   Dg Chest Portable 1 View  Result Date: 09/01/2016 CLINICAL DATA:  Altered mental status. Abdominal distention. Bradypnea. EXAM: PORTABLE CHEST 1 VIEW COMPARISON:  Chest radiographs 07/21/2016 FINDINGS: Low lung volumes. Patient's hand partially obscures evaluation of the left lung base. Improved left basilar aeration with probable residual small volume pleural fluid. Streaky bibasilar opacities likely atelectasis. Normal heart size. Unchanged mediastinal contours. No pneumothorax. No acute osseous abnormality. IMPRESSION: Low lung volumes with small left pleural effusion, decreased in size from radiographs 6 weeks prior. Streaky bibasilar atelectasis. Electronically Signed   By: Rubye Oaks M.D.   On: 09/01/2016 21:20    Lab Data:  CBC:  Recent Labs Lab 09/01/16 2004 09/02/16 0217 09/03/16 0358 09/04/16 0428 09/05/16 0307  WBC 9.0 7.1 9.7 14.8* 15.3*  NEUTROABS 6.4  --   --   --   --   HGB 8.0* 7.5* 9.4* 10.4* 10.2*  HCT 22.9* 21.5* 27.2* 30.0* 30.7*  MCV 84.8 84.6 85.3 85.7 87.5  PLT 183 169 186 225 249   Basic Metabolic Panel:  Recent Labs Lab 09/02/16 0217 09/03/16 0358 09/04/16 0428 09/05/16 0307 09/06/16 0621  NA 130* 133* 136 131* 131*  K 5.4* 5.5* 5.4* 4.9 3.9  CL 110 113* 116* 110 110  CO2 13* 15* 10* 10* 14*  GLUCOSE 95 69 104* 101* 114*  BUN 96* 91* 86* 81* 65*  CREATININE 2.15* 2.00* 1.90* 1.63* 1.19*  CALCIUM 7.0* 7.2* 7.8* 7.7* 7.8*   GFR: Estimated Creatinine Clearance: 50.4 mL/min (by C-G formula based on SCr of 1.19 mg/dL (H)). Liver Function Tests:  Recent Labs Lab 09/02/16 0217 09/03/16 0358 09/04/16 0428 09/05/16 0307 09/06/16 0621  AST 25 29 37 40 57*  ALT 12* 14 16 18 29   ALKPHOS 65 65 66 75 81  BILITOT 1.4* 1.5* 1.4* 1.0 1.2  PROT 5.1* 5.0* 5.4* 5.3* 5.0*  ALBUMIN 1.8*  1.5* 1.5* 1.5* 1.4*   No results for input(s): LIPASE, AMYLASE in the last 168 hours.  Recent Labs Lab 09/01/16 2004 09/04/16 0428 09/06/16 0621  AMMONIA 150* 75* 27   Coagulation Profile:  Recent Labs Lab 09/03/16 1832  INR 1.68   Cardiac Enzymes: No results for input(s): CKTOTAL, CKMB, CKMBINDEX, TROPONINI in the last 168 hours. BNP (last 3 results) No results for input(s): PROBNP in the last 8760 hours. HbA1C: No results for input(s): HGBA1C in the last 72 hours. CBG:  Recent Labs Lab 09/01/16 1941 09/02/16 2314 09/03/16 0804 09/03/16 1042 09/03/16 1912  GLUCAP 99 83 64* 112* 108*   Lipid Profile: No results for input(s): CHOL, HDL, LDLCALC, TRIG, CHOLHDL, LDLDIRECT in the last 72 hours. Thyroid Function Tests: No results for input(s): TSH, T4TOTAL, FREET4, T3FREE, THYROIDAB in the last 72 hours. Anemia Panel: No results for input(s): VITAMINB12, FOLATE, FERRITIN, TIBC, IRON, RETICCTPCT in the last 72 hours. Urine analysis:    Component Value Date/Time   COLORURINE YELLOW 09/01/2016 2143   APPEARANCEUR CLEAR 09/01/2016 2143   LABSPEC 1.010 09/01/2016 2143   PHURINE 5.0 09/01/2016 2143   GLUCOSEU NEGATIVE 09/01/2016 2143   HGBUR NEGATIVE 09/01/2016 2143   BILIRUBINUR NEGATIVE 09/01/2016 2143   BILIRUBINUR small 12/27/2011 1031   KETONESUR NEGATIVE 09/01/2016 2143   PROTEINUR NEGATIVE 09/01/2016 2143   UROBILINOGEN 0.2 12/27/2011 1031   NITRITE NEGATIVE 09/01/2016 2143   LEUKOCYTESUR NEGATIVE 09/01/2016 2143     RAI,RIPUDEEP M.D. [facility] 09/06/2016, 10:21 AM  Pager: [phone] Between 7am  to 7pm - call Pager - [phone]  After 7pm go to [email or web address] - password [email or web address]  Call night coverage person covering after 7pm

## 2016-09-06 NOTE — Consult Note (Signed)
WOC Nurse wound consult note Reason for Consult:Full thickness wound on posterior Right LE and deep tissue pressure injury (DTPI) on right heel. Patient is incontinent of liquid stool at the time of my assessment.  In the cleansing of her skin, IAD is noted. Patient did not eat breakfast, states she has "no appetite today". Wound type: Pressure in the presence of serious illness, moisture secondary to fecal incontinence.  Skin changes at life's end (SCALE) is suspected. Pressure Injury POA: No Measurement:Right heel DTPI measures 6cm x 4cm with blood and serum filled blister noted.  Superior to this skin injury on the RLE (posterior aspect) is a full thickness linear wound measuring 4cm x 1cm x 0.2cm with a red, moist base, scant serous exudate. Wound bed:As described above Drainage (amount, consistency, odor) As described above Periwound:Intact, dry.  LLE and foot with edema Dressing procedure/placement/frequency: Palliative care plan is noted. Prevalon Boots are in place for pressure redistribution.  I will today add a mattress replacement with low air loss feature. To assist with overall pressure redistribution and airflow to decrease maceration.  Turning and repositioning is still a cornerstone of her skin care plan. WOC nursing team will not follow, but will remain available to this patient, the nursing and medical teams.  Please re-consult if needed. Thanks, Ladona MowLaurie Cyndal Kasson, MSN, RN, GNP, Hans EdenCWOCN, CWON-AP, FAAN  Pager# [phone]

## 2016-09-07 LAB — CULTURE, BODY FLUID-BOTTLE: CULTURE: NO GROWTH

## 2016-09-07 NOTE — Progress Notes (Signed)
[facility]                                                                              Patient Demographics  Christine [name], is a 56 y.o. female, DOB - 05-06-1961, ION:[id number]  Admit date - 09/01/2016   Admitting Physician Alberteen Sam, MD  Outpatient Primary MD for the patient is [name], MD  Outpatient specialists:   LOS - 6  days    Chief Complaint  Patient presents with  . Altered Mental Status       Brief summary   56 y.o.femalewith a history of decompensated alcoholic cirrhosis, Hepatic encephalopathy, ascites who presented with altered mental status and lethargy, and was found to be in shock with suspected sepsis.   Assessment & Plan    Principal diagnoses  Shock - Hypotension  - In the setting of decompensated alcoholic cirrhosis, hepatic encephalopathy and ascites, end-stage liver disease. 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 - Medications for pain or shortness of breath not to be held due to concerns for hypotension if needed to ensure her comfort.  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 - continue high dose lactulose  - Ammonia level improving, patient is alert and oriented   Hyponatremia - in the setting of fluid overload/liver disease -Sodium level stable,  cannot diurese due to  hypotension  Hyperkalemia - Improved with increased stool output  Acute kidney injury -Renal function improved, continue to follow trend - avoid nephrotoxic meds   Anemia - of chronic liver disease - Follow H&H  Urinary retention  - Foley catheter place   Code Status: DO NOT RESUSCITATE status DVT Prophylaxis: SCDs Family Communication: Discussed in detail with the patient, all imaging results, lab results explained to the patient    Disposition Plan: Eventual return to skilled nursing facility, hopefully Monday  Time Spent in minutes 15 minutes  Procedures:  Paracentesis 1/30  Consultants:   Palliative medicine  Antimicrobials:  Vancomycin 1/29 > 1/31 Zosyn 1/29 > 1/31   Medications  Scheduled Meds: . feeding supplement (ENSURE ENLIVE)  237 mL Oral BID BM  . heparin subcutaneous  5,000 Units Subcutaneous Q8H  . lactulose  30 g Oral TID  . mouth rinse  15 mL Mouth Rinse BID  . midodrine  10 mg Oral TID WC  . sodium chloride flush  3 mL Intravenous Q12H   Continuous Infusions: PRN Meds:.fentaNYL (SUBLIMAZE) injection, LORazepam   Antibiotics   Anti-infectives    Start     Dose/Rate Route Frequency Ordered Stop   09/02/16 2300  vancomycin (VANCOCIN) IVPB 750 mg/150 ml premix  Status:  Discontinued     750 mg 150 mL/hr over 60 Minutes Intravenous Every 24 hours 09/01/16 2240 09/03/16 1653   09/02/16 0700  piperacillin-tazobactam (ZOSYN) IVPB 3.375 g  Status:  Discontinued     3.375 g 12.5 mL/hr over 240 Minutes Intravenous Every 8 hours 09/01/16 2240 09/03/16 1653   09/01/16 2245  piperacillin-tazobactam (ZOSYN) IVPB 3.375 g     3.375 g 100 mL/hr over 30 Minutes Intravenous  Once 09/01/16 2236 09/01/16 2324   09/01/16 2245  vancomycin (VANCOCIN) IVPB 1000 mg/200 mL premix     1,000 mg 200 mL/hr over 60 Minutes Intravenous  Once 09/01/16 2236 09/02/16 0036        Subjective:   Christine [name] was seen and examined today. Does not feel too well. Alert  and oriented. Abdominal pain controlled, has distention. No fevers. Currently no shortness of breath. Patient denies dizziness, chest pain, N/V/D/C, new weakness, numbess, tingling.   Objective:   Vitals:   09/05/16 2355 09/06/16 0439 09/06/16 2041 09/07/16 0503  BP: (!) 104/58 103/64 114/61 (!) 102/55  Pulse: (!) 102 (!) 101 (!) 103 100  Resp: 19 20 19 20   Temp: 98.2 F (36.8 C) 98.7 F (37.1 C) 98.4 F (36.9 C) 98.1 F (36.7 C)  TempSrc:      SpO2: 100% 100% 100% 100%  Weight: 69.3 kg (152 lb 12.5 oz)     Height: 5\' 4"  (1.626 m)       Intake/Output Summary (Last 24 hours) at 09/07/16 1038 Last data filed at 09/06/16 2208  Gross per 24 hour  Intake                0 ml  Output              200 ml  Net             -200 ml     Wt Readings from Last 3 Encounters:  09/05/16 69.3 kg (152 lb 12.5 oz)  08/29/16 78 kg (172 lb)  08/25/16 78 kg (172 lb)     Exam  General: Alert and oriented x 3, NAD  HEENT:    Neck:   Cardiovascular: S1 S2 clear, RRR  Respiratory: Clear to auscultation bilaterally, no wheezing, rales or rhonchi  Gastrointestinal: Soft, nontender, distended, + bowel sounds  Ext: no cyanosis clubbing, 2+ edema  Neuro: no focal neurological deficits  Skin: No rashes  Psych: Normal affect and demeanor, alert and oriented x3    Data Reviewed:  I have personally reviewed following labs and imaging studies  Micro Results Recent Results (from the past 240 hour(s))  Blood culture (routine x 2)     Status: None   Collection Time: 09/01/16  7:30 PM  Result Value Ref Range Status   Specimen Description BLOOD LEFT HAND  Final   Special Requests BOTTLES DRAWN AEROBIC ONLY 7CC  Final   Culture NO GROWTH 5 DAYS  Final   Report Status 09/06/2016 FINAL  Final  Blood culture (routine x 2)     Status: Abnormal   Collection Time: 09/01/16  8:07 PM  Result Value Ref Range Status   Specimen Description BLOOD RIGHT HAND  Final   Special Requests IN PEDIATRIC  BOTTLE 5CC  Final   Culture  Setup Time   Final    GRAM POSITIVE COCCI IN CLUSTERS IN PEDIATRIC BOTTLE CRITICAL RESULT CALLED TO, READ BACK BY AND VERIFIED WITH: Fonnie MuA. Johnston  Pharm.D. 17:50 09/02/16 ([email or web address])    Culture (A)  Final    STAPHYLOCOCCUS SPECIES (COAGULASE NEGATIVE) THE SIGNIFICANCE OF ISOLATING THIS ORGANISM FROM A SINGLE SET OF BLOOD CULTURES WHEN MULTIPLE SETS ARE DRAWN IS UNCERTAIN. PLEASE NOTIFY THE MICROBIOLOGY DEPARTMENT WITHIN ONE WEEK IF SPECIATION AND SENSITIVITIES ARE REQUIRED.    Report Status 09/03/2016 FINAL  Final  Blood Culture ID Panel (Reflexed)     Status: Abnormal   Collection Time: 09/01/16  8:07 PM  Result Value Ref Range Status   Enterococcus species NOT DETECTED NOT DETECTED Final   Listeria monocytogenes NOT DETECTED NOT DETECTED Final   Staphylococcus species DETECTED (A) NOT DETECTED Final    Comment: Methicillin (oxacillin) resistant coagulase negative staphylococcus. Possible blood culture contaminant (unless isolated from more than one blood culture draw or clinical case suggests pathogenicity). No antibiotic treatment is indicated for blood  culture contaminants. CRITICAL RESULT CALLED TO, READ BACK BY AND VERIFIED WITH: ADagoberto Ligas.D. 17:50 09/02/16 (wilsonm)    Staphylococcus aureus NOT DETECTED NOT DETECTED Final   Methicillin resistance DETECTED (A) NOT DETECTED Final    Comment: CRITICAL RESULT CALLED TO, READ BACK BY AND VERIFIED WITH: ADagoberto Ligas.D. 17:50 09/02/16 (wilsonm)    Streptococcus species NOT DETECTED NOT DETECTED Final   Streptococcus agalactiae NOT DETECTED NOT DETECTED Final   Streptococcus pneumoniae NOT DETECTED NOT DETECTED Final   Streptococcus pyogenes NOT DETECTED NOT DETECTED Final   Acinetobacter baumannii NOT DETECTED NOT DETECTED Final   Enterobacteriaceae species NOT DETECTED NOT DETECTED Final   Enterobacter cloacae complex NOT DETECTED NOT DETECTED Final   Escherichia coli NOT DETECTED NOT DETECTED  Final   Klebsiella oxytoca NOT DETECTED NOT DETECTED Final   Klebsiella pneumoniae NOT DETECTED NOT DETECTED Final   Proteus species NOT DETECTED NOT DETECTED Final   Serratia marcescens NOT DETECTED NOT DETECTED Final   Haemophilus influenzae NOT DETECTED NOT DETECTED Final   Neisseria meningitidis NOT DETECTED NOT DETECTED Final   Pseudomonas aeruginosa NOT DETECTED NOT DETECTED Final   Candida albicans NOT DETECTED NOT DETECTED Final   Candida glabrata NOT DETECTED NOT DETECTED Final   Candida krusei NOT DETECTED NOT DETECTED Final   Candida parapsilosis NOT DETECTED NOT DETECTED Final   Candida tropicalis NOT DETECTED NOT DETECTED Final  Culture, body fluid-bottle     Status: None (Preliminary result)   Collection Time: 09/02/16 11:49 AM  Result Value Ref Range Status   Specimen Description FLUID PERITONEAL  Final   Special Requests NONE  Final   Culture NO GROWTH 4 DAYS  Final   Report Status PENDING  Incomplete  Gram stain     Status: None   Collection Time: 09/02/16 11:49 AM  Result Value Ref Range Status   Specimen Description FLUID PERITONEAL  Final   Special Requests NONE  Final   Gram Stain   Final    RARE WBC PRESENT, PREDOMINANTLY PMN NO ORGANISMS SEEN    Report Status 09/02/2016 FINAL  Final  MRSA PCR Screening     Status: Abnormal   Collection Time: 09/02/16  3:27 PM  Result Value Ref Range Status   MRSA by PCR POSITIVE (A) NEGATIVE Final    Comment:        The GeneXpert MRSA Assay (FDA approved for NASAL specimens only), is one component of a comprehensive MRSA colonization surveillance program. It is not intended to diagnose MRSA infection nor to guide or monitor treatment for MRSA infections. RESULT CALLED TO, READ BACK BY AND VERIFIED WITH:  Gwenith Spitz RN 1610 09/02/16 A BROWNING     Radiology Reports US Paracentesis  Result Date: 09/02/2016 INDICATION: Decompensated alcoholic cirrhosis. Altered mental status. Hypotension. Acute kidney injury.  Recurrent ascites. Request diagnostic and therapeutic paracentesis. EXAM: ULTRASOUND GUIDED RIGHT LOWER QUADRANT PARACENTESIS MEDICATIONS: None. COMPLICATIONS: None immediate. PROCEDURE: Informed written consent was obtained from the patient after a discussion of the risks, benefits and alternatives to treatment. A timeout was performed prior to the initiation of the procedure. Initial ultrasound scanning demonstrates a large amount of ascites within the right lower abdominal quadrant. The right lower abdomen was prepped and draped in the usual sterile fashion. 1% lidocaine with epinephrine was used for local anesthesia. Following this, a 19 gauge, 7-cm, Yueh catheter was introduced. An ultrasound image was saved for documentation purposes. The paracentesis was performed. The catheter was removed and a dressing was applied. The patient tolerated the procedure well without immediate post procedural complication. FINDINGS: A total of approximately 2.5 L of clear yellow fluid was removed. Procedure was terminated at this time due to hypotension. Samples were sent to the laboratory as requested by the clinical team. IMPRESSION: Successful ultrasound-guided paracentesis yielding 2.5 liters of peritoneal fluid. Read by: Brayton El PA-C Electronically Signed   By: Simonne Come M.D.   On: 09/02/2016 11:55   US Paracentesis  Result Date: 08/26/2016 INDICATION: Alcoholic cirrhosis, recurrent ascites. Request made for therapeutic paracentesis. EXAM: ULTRASOUND GUIDED THERAPEUTIC PARACENTESIS MEDICATIONS: None. COMPLICATIONS: None immediate. PROCEDURE: Informed written consent was obtained from the patient after a discussion of the risks, benefits and alternatives to treatment. A timeout was performed prior to the initiation of the procedure. Initial ultrasound scanning demonstrates a large amount of ascites within the right lower abdominal quadrant. The right lower abdomen was prepped and draped in the usual sterile  fashion. 1% lidocaine was used for local anesthesia. Following this, a Yueh catheter was introduced. An ultrasound image was saved for documentation purposes. The paracentesis was performed. The catheter was removed and a dressing was applied. The patient tolerated the procedure well without immediate post procedural complication. FINDINGS: A total of approximately 9.3 liters of yellow fluid was removed. IMPRESSION: Successful ultrasound-guided therapeutic paracentesis yielding 9.3 liters of peritoneal fluid. Read by: Jeananne Rama, PA-C Electronically Signed   By: Irish Lack M.D.   On: 08/26/2016 11:05   US Paracentesis  Result Date: 08/13/2016 INDICATION: Alcoholic cirrhosis, recurrent ascites. Request made for therapeutic paracentesis. EXAM: ULTRASOUND GUIDED THERAPEUTIC PARACENTESIS MEDICATIONS: None. COMPLICATIONS: None immediate. PROCEDURE: Informed written consent was obtained from the patient after a discussion of the risks, benefits and alternatives to treatment. A timeout was performed prior to the initiation of the procedure. Initial ultrasound scanning demonstrates a large amount of ascites within the right lower abdominal quadrant. The right lower abdomen was prepped and draped in the usual sterile fashion. 1% lidocaine was used for local anesthesia. Following this, a Yueh catheter was introduced. An ultrasound image was saved for documentation purposes. The paracentesis was performed. The catheter was removed and a dressing was applied. The patient tolerated the procedure well without immediate post procedural complication. FINDINGS: A total of approximately 7.2 liters of yellow fluid was removed. IMPRESSION: Successful ultrasound-guided therapeutic paracentesis yielding 7.2 liters of peritoneal fluid. Only the above amount of fluid was removed today secondary to pt hypotension. Read by: Jeananne Rama, PA-C Electronically Signed   By: Gilmer Mor D.O.   On: 08/13/2016 13:14   Dg Chest  Portable 1 View  Result Date: 09/01/2016 CLINICAL  DATA:  Altered mental status. Abdominal distention. Bradypnea. EXAM: PORTABLE CHEST 1 VIEW COMPARISON:  Chest radiographs 07/21/2016 FINDINGS: Low lung volumes. Patient's hand partially obscures evaluation of the left lung base. Improved left basilar aeration with probable residual small volume pleural fluid. Streaky bibasilar opacities likely atelectasis. Normal heart size. Unchanged mediastinal contours. No pneumothorax. No acute osseous abnormality. IMPRESSION: Low lung volumes with small left pleural effusion, decreased in size from radiographs 6 weeks prior. Streaky bibasilar atelectasis. Electronically Signed   By: Rubye Oaks M.D.   On: 09/01/2016 21:20    Lab Data:  CBC:  Recent Labs Lab 09/01/16 2004 09/02/16 0217 09/03/16 0358 09/04/16 0428 09/05/16 0307  WBC 9.0 7.1 9.7 14.8* 15.3*  NEUTROABS 6.4  --   --   --   --   HGB 8.0* 7.5* 9.4* 10.4* 10.2*  HCT 22.9* 21.5* 27.2* 30.0* 30.7*  MCV 84.8 84.6 85.3 85.7 87.5  PLT 183 169 186 225 249   Basic Metabolic Panel:  Recent Labs Lab 09/02/16 0217 09/03/16 0358 09/04/16 0428 09/05/16 0307 09/06/16 0621  NA 130* 133* 136 131* 131*  K 5.4* 5.5* 5.4* 4.9 3.9  CL 110 113* 116* 110 110  CO2 13* 15* 10* 10* 14*  GLUCOSE 95 69 104* 101* 114*  BUN 96* 91* 86* 81* 65*  CREATININE 2.15* 2.00* 1.90* 1.63* 1.19*  CALCIUM 7.0* 7.2* 7.8* 7.7* 7.8*   GFR: Estimated Creatinine Clearance: 50.4 mL/min (by C-G formula based on SCr of 1.19 mg/dL (H)). Liver Function Tests:  Recent Labs Lab 09/02/16 0217 09/03/16 0358 09/04/16 0428 09/05/16 0307 09/06/16 0621  AST 25 29 37 40 57*  ALT 12* 14 16 18 29   ALKPHOS 65 65 66 75 81  BILITOT 1.4* 1.5* 1.4* 1.0 1.2  PROT 5.1* 5.0* 5.4* 5.3* 5.0*  ALBUMIN 1.8* 1.5* 1.5* 1.5* 1.4*   No results for input(s): LIPASE, AMYLASE in the last 168 hours.  Recent Labs Lab 09/01/16 2004 09/04/16 0428 09/06/16 0621  AMMONIA 150* 75* 27     Coagulation Profile:  Recent Labs Lab 09/03/16 1832  INR 1.68   Cardiac Enzymes: No results for input(s): CKTOTAL, CKMB, CKMBINDEX, TROPONINI in the last 168 hours. BNP (last 3 results) No results for input(s): PROBNP in the last 8760 hours. HbA1C: No results for input(s): HGBA1C in the last 72 hours. CBG:  Recent Labs Lab 09/01/16 1941 09/02/16 2314 09/03/16 0804 09/03/16 1042 09/03/16 1912  GLUCAP 99 83 64* 112* 108*   Lipid Profile: No results for input(s): CHOL, HDL, LDLCALC, TRIG, CHOLHDL, LDLDIRECT in the last 72 hours. Thyroid Function Tests: No results for input(s): TSH, T4TOTAL, FREET4, T3FREE, THYROIDAB in the last 72 hours. Anemia Panel: No results for input(s): VITAMINB12, FOLATE, FERRITIN, TIBC, IRON, RETICCTPCT in the last 72 hours. Urine analysis:    Component Value Date/Time   COLORURINE YELLOW 09/01/2016 2143   APPEARANCEUR CLEAR 09/01/2016 2143   LABSPEC 1.010 09/01/2016 2143   PHURINE 5.0 09/01/2016 2143   GLUCOSEU NEGATIVE 09/01/2016 2143   HGBUR NEGATIVE 09/01/2016 2143   BILIRUBINUR NEGATIVE 09/01/2016 2143   BILIRUBINUR small 12/27/2011 1031   KETONESUR NEGATIVE 09/01/2016 2143   PROTEINUR NEGATIVE 09/01/2016 2143   UROBILINOGEN 0.2 12/27/2011 1031   NITRITE NEGATIVE 09/01/2016 2143   LEUKOCYTESUR NEGATIVE 09/01/2016 2143     Joyclyn Plazola M.D. [facility] 09/07/2016, 10:38 AM  Pager: [phone] Between 7am to 7pm - call Pager - [phone]  After 7pm go to [email or web address] - password [email or web address]  Call night coverage person  covering after 7pm

## 2016-09-08 ENCOUNTER — Encounter (HOSPITAL_COMMUNITY): Payer: Self-pay | Admitting: General Practice

## 2016-09-08 MED ORDER — OXYCODONE HCL 5 MG PO TABS
5.0000 mg | ORAL_TABLET | ORAL | Status: DC | PRN
  Administered 2016-09-08 – 2016-09-11 (×12): 5 mg via ORAL
  Filled 2016-09-08 (×12): qty 1

## 2016-09-08 MED ORDER — PREMIER PROTEIN SHAKE
11.0000 [oz_av] | Freq: Two times a day (BID) | ORAL | Status: DC
  Administered 2016-09-08 – 2016-09-10 (×3): 11 [oz_av] via ORAL
  Filled 2016-09-08 (×10): qty 325.31

## 2016-09-08 MED ORDER — UNJURY CHICKEN SOUP POWDER
2.0000 [oz_av] | Freq: Two times a day (BID) | ORAL | Status: DC
  Administered 2016-09-08 – 2016-09-11 (×7): 2 [oz_av] via ORAL
  Filled 2016-09-08 (×7): qty 27

## 2016-09-08 MED ORDER — FENTANYL 12 MCG/HR TD PT72
12.5000 ug | MEDICATED_PATCH | TRANSDERMAL | Status: DC
  Administered 2016-09-08: 12.5 ug via TRANSDERMAL
  Filled 2016-09-08: qty 1

## 2016-09-08 NOTE — Progress Notes (Signed)
[facility]                                                                              Patient Demographics  Christine [name], is a 56 y.o. female, DOB - 1960-09-20, ZOX:[id number]  Admit date - 09/01/2016   Admitting Physician Alberteen Sam, MD  Outpatient Primary MD for the patient is [name], MD  Outpatient specialists:   LOS - 7  days    Chief Complaint  Patient presents with  . Altered Mental Status       Brief summary   56 y.o.femalewith a history of decompensated alcoholic cirrhosis, Hepatic encephalopathy, ascites who presented with altered mental status and lethargy, and was found to be in shock with suspected sepsis.   Assessment & Plan    Principal diagnoses  Shock - Hypotension  - In the setting of decompensated alcoholic cirrhosis, hepatic encephalopathy and ascites, end-stage liver disease. Unclear cause, septic versus hypovolemia - Status post albumin  - Discontinued antibiotics with ascites fluid not convincing for SBP - baseline low BP, systolic BP appears to be ~100 per prior admits - increase midodrine to max dose and follow   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 - continue high dose lactulose  - Patient is alert and awake, however overall prognosis poor, no significant improvement in the overall status in the last 72 hours.  1 of 2 Blood cx + for Meth resistant Coag Neg Staph  - suspect this is a contaminant - discontinued antibiotics - follow clinically  Goals of care - Husband wishes to continue conservative medical care - Palliative medicine was consulted, appreciate recommendations, no aggressive interventions in the event of decompensation, if she continues to worsen, goal will be focused on comfort. - Medications for pain or shortness of breath not to be held due to concerns for hypotension if needed to ensure her comfort.  Hyponatremia - in the setting of fluid  overload/liver disease -Sodium level stable, cannot diurese due to hypotension  Hyperkalemia - Improved with increased stool output  Acute kidney injury - Renal function improved, continue to follow trend - avoid nephrotoxic meds   Anemia - of chronic liver disease - Follow H&H  Urinary retention  - Foley catheter placed   Code Status: DO NOT RESUSCITATE status DVT Prophylaxis: SCDs Family Communication: Discussed in detail with the patient, all imaging results, lab results explained to the patient    Disposition Plan: Unclear, overall prognosis poor, however does not appear to be terminal, discussed with palliative medicine, Dr. Neale Burly in detail. Possibility of disposition with nursing facility with hospice to follow  Time Spent in minutes 15 minutes  Procedures:  Paracentesis 1/30  Consultants:   Palliative medicine  Antimicrobials:  Vancomycin 1/29 > 1/31 Zosyn 1/29 > 1/31   Medications  Scheduled Meds: . feeding supplement (ENSURE ENLIVE)  237 mL Oral BID BM  . heparin subcutaneous  5,000 Units Subcutaneous Q8H  . lactulose  30 g Oral TID  . mouth rinse  15 mL Mouth Rinse BID  . midodrine  10 mg Oral TID WC  . sodium chloride flush  3 mL Intravenous  Q12H   Continuous Infusions: PRN Meds:.LORazepam, oxyCODONE   Antibiotics   Anti-infectives    Start     Dose/Rate Route Frequency Ordered Stop   09/02/16 2300  vancomycin (VANCOCIN) IVPB 750 mg/150 ml premix  Status:  Discontinued     750 mg 150 mL/hr over 60 Minutes Intravenous Every 24 hours 09/01/16 2240 09/03/16 1653   09/02/16 0700  piperacillin-tazobactam (ZOSYN) IVPB 3.375 g  Status:  Discontinued     3.375 g 12.5 mL/hr over 240 Minutes Intravenous Every 8 hours 09/01/16 2240 09/03/16 1653   09/01/16 2245  piperacillin-tazobactam (ZOSYN) IVPB 3.375 g     3.375 g 100 mL/hr over 30 Minutes Intravenous  Once 09/01/16 2236 09/01/16 2324   09/01/16 2245  vancomycin (VANCOCIN) IVPB 1000 mg/200 mL  premix     1,000 mg 200 mL/hr over 60 Minutes Intravenous  Once 09/01/16 2236 09/02/16 0036        Subjective:   Christine [name] was seen and examined today. Denies any specific complaints. Alert and awake. Abdominal pain controlled, has distention. No fevers. Currently no shortness of breath. Patient denies dizziness, chest pain, N/V/D/C, new weakness, numbess, tingling.   Objective:   Vitals:   09/06/16 2041 09/07/16 0503 09/07/16 2213 09/08/16 0617  BP: 114/61 (!) 102/55 112/61 110/62  Pulse: (!) 103 100 (!) 105 (!) 109  Resp: 19 20 18 18   Temp: 98.4 F (36.9 C) 98.1 F (36.7 C) 98.2 F (36.8 C) 97.7 F (36.5 C)  TempSrc:   Oral Oral  SpO2: 100% 100% 100% 100%  Weight:      Height:        Intake/Output Summary (Last 24 hours) at 09/08/16 1225 Last data filed at 09/08/16 1203  Gross per 24 hour  Intake              540 ml  Output                0 ml  Net              540 ml     Wt Readings from Last 3 Encounters:  09/05/16 69.3 kg (152 lb 12.5 oz)  08/29/16 78 kg (172 lb)  08/25/16 78 kg (172 lb)     Exam  General: Alert and oriented x 3, NAD  HEENT:    Neck:   Cardiovascular: S1 S2 clear, RRR  Respiratory: CTAB anteriorly  Gastrointestinal: Soft, nontender, distended, + bowel sounds  Ext: no cyanosis clubbing, 2+ edema  Neuro: no focal neurological deficits  Skin: No rashes  Psych: Normal affect and demeanor, alert and oriented x3    Data Reviewed:  I have personally reviewed following labs and imaging studies  Micro Results Recent Results (from the past 240 hour(s))  Blood culture (routine x 2)     Status: None   Collection Time: 09/01/16  7:30 PM  Result Value Ref Range Status   Specimen Description BLOOD LEFT HAND  Final   Special Requests BOTTLES DRAWN AEROBIC ONLY 7CC  Final   Culture NO GROWTH 5 DAYS  Final   Report Status 09/06/2016 FINAL  Final  Blood culture (routine x 2)     Status: Abnormal   Collection Time: 09/01/16  8:07  PM  Result Value Ref Range Status   Specimen Description BLOOD RIGHT HAND  Final   Special Requests IN PEDIATRIC BOTTLE 5CC  Final   Culture  Setup Time   Final    GRAM POSITIVE COCCI IN  CLUSTERS IN PEDIATRIC BOTTLE CRITICAL RESULT CALLED TO, READ BACK BY AND VERIFIED WITH: ADagoberto Ligas.D. 17:50 09/02/16 ([email or web address])    Culture (A)  Final    STAPHYLOCOCCUS SPECIES (COAGULASE NEGATIVE) THE SIGNIFICANCE OF ISOLATING THIS ORGANISM FROM A SINGLE SET OF BLOOD CULTURES WHEN MULTIPLE SETS ARE DRAWN IS UNCERTAIN. PLEASE NOTIFY THE MICROBIOLOGY DEPARTMENT WITHIN ONE WEEK IF SPECIATION AND SENSITIVITIES ARE REQUIRED.    Report Status 09/03/2016 FINAL  Final  Blood Culture ID Panel (Reflexed)     Status: Abnormal   Collection Time: 09/01/16  8:07 PM  Result Value Ref Range Status   Enterococcus species NOT DETECTED NOT DETECTED Final   Listeria monocytogenes NOT DETECTED NOT DETECTED Final   Staphylococcus species DETECTED (A) NOT DETECTED Final    Comment: Methicillin (oxacillin) resistant coagulase negative staphylococcus. Possible blood culture contaminant (unless isolated from more than one blood culture draw or clinical case suggests pathogenicity). No antibiotic treatment is indicated for blood  culture contaminants. CRITICAL RESULT CALLED TO, READ BACK BY AND VERIFIED WITH: ADagoberto Ligas.D. 17:50 09/02/16 (wilsonm)    Staphylococcus aureus NOT DETECTED NOT DETECTED Final   Methicillin resistance DETECTED (A) NOT DETECTED Final    Comment: CRITICAL RESULT CALLED TO, READ BACK BY AND VERIFIED WITH: ADagoberto Ligas.D. 17:50 09/02/16 (wilsonm)    Streptococcus species NOT DETECTED NOT DETECTED Final   Streptococcus agalactiae NOT DETECTED NOT DETECTED Final   Streptococcus pneumoniae NOT DETECTED NOT DETECTED Final   Streptococcus pyogenes NOT DETECTED NOT DETECTED Final   Acinetobacter baumannii NOT DETECTED NOT DETECTED Final   Enterobacteriaceae species NOT DETECTED NOT DETECTED  Final   Enterobacter cloacae complex NOT DETECTED NOT DETECTED Final   Escherichia coli NOT DETECTED NOT DETECTED Final   Klebsiella oxytoca NOT DETECTED NOT DETECTED Final   Klebsiella pneumoniae NOT DETECTED NOT DETECTED Final   Proteus species NOT DETECTED NOT DETECTED Final   Serratia marcescens NOT DETECTED NOT DETECTED Final   Haemophilus influenzae NOT DETECTED NOT DETECTED Final   Neisseria meningitidis NOT DETECTED NOT DETECTED Final   Pseudomonas aeruginosa NOT DETECTED NOT DETECTED Final   Candida albicans NOT DETECTED NOT DETECTED Final   Candida glabrata NOT DETECTED NOT DETECTED Final   Candida krusei NOT DETECTED NOT DETECTED Final   Candida parapsilosis NOT DETECTED NOT DETECTED Final   Candida tropicalis NOT DETECTED NOT DETECTED Final  Culture, body fluid-bottle     Status: None   Collection Time: 09/02/16 11:49 AM  Result Value Ref Range Status   Specimen Description FLUID PERITONEAL  Final   Special Requests NONE  Final   Culture NO GROWTH 5 DAYS  Final   Report Status 09/07/2016 FINAL  Final  Gram stain     Status: None   Collection Time: 09/02/16 11:49 AM  Result Value Ref Range Status   Specimen Description FLUID PERITONEAL  Final   Special Requests NONE  Final   Gram Stain   Final    RARE WBC PRESENT, PREDOMINANTLY PMN NO ORGANISMS SEEN    Report Status 09/02/2016 FINAL  Final  MRSA PCR Screening     Status: Abnormal   Collection Time: 09/02/16  3:27 PM  Result Value Ref Range Status   MRSA by PCR POSITIVE (A) NEGATIVE Final    Comment:        The GeneXpert MRSA Assay (FDA approved for NASAL specimens only), is one component of a comprehensive MRSA colonization surveillance program. It is not intended to diagnose MRSA infection nor to guide  or monitor treatment for MRSA infections. RESULT CALLED TO, READ BACK BY AND VERIFIED WITHGwenith Spitz RN 1610 09/02/16 A BROWNING     Radiology Reports US Paracentesis  Result Date: 09/02/2016 INDICATION:  Decompensated alcoholic cirrhosis. Altered mental status. Hypotension. Acute kidney injury. Recurrent ascites. Request diagnostic and therapeutic paracentesis. EXAM: ULTRASOUND GUIDED RIGHT LOWER QUADRANT PARACENTESIS MEDICATIONS: None. COMPLICATIONS: None immediate. PROCEDURE: Informed written consent was obtained from the patient after a discussion of the risks, benefits and alternatives to treatment. A timeout was performed prior to the initiation of the procedure. Initial ultrasound scanning demonstrates a large amount of ascites within the right lower abdominal quadrant. The right lower abdomen was prepped and draped in the usual sterile fashion. 1% lidocaine with epinephrine was used for local anesthesia. Following this, a 19 gauge, 7-cm, Yueh catheter was introduced. An ultrasound image was saved for documentation purposes. The paracentesis was performed. The catheter was removed and a dressing was applied. The patient tolerated the procedure well without immediate post procedural complication. FINDINGS: A total of approximately 2.5 L of clear yellow fluid was removed. Procedure was terminated at this time due to hypotension. Samples were sent to the laboratory as requested by the clinical team. IMPRESSION: Successful ultrasound-guided paracentesis yielding 2.5 liters of peritoneal fluid. Read by: Brayton El PA-C Electronically Signed   By: Simonne Come M.D.   On: 09/02/2016 11:55   US Paracentesis  Result Date: 08/26/2016 INDICATION: Alcoholic cirrhosis, recurrent ascites. Request made for therapeutic paracentesis. EXAM: ULTRASOUND GUIDED THERAPEUTIC PARACENTESIS MEDICATIONS: None. COMPLICATIONS: None immediate. PROCEDURE: Informed written consent was obtained from the patient after a discussion of the risks, benefits and alternatives to treatment. A timeout was performed prior to the initiation of the procedure. Initial ultrasound scanning demonstrates a large amount of ascites within the right lower  abdominal quadrant. The right lower abdomen was prepped and draped in the usual sterile fashion. 1% lidocaine was used for local anesthesia. Following this, a Yueh catheter was introduced. An ultrasound image was saved for documentation purposes. The paracentesis was performed. The catheter was removed and a dressing was applied. The patient tolerated the procedure well without immediate post procedural complication. FINDINGS: A total of approximately 9.3 liters of yellow fluid was removed. IMPRESSION: Successful ultrasound-guided therapeutic paracentesis yielding 9.3 liters of peritoneal fluid. Read by: Jeananne Rama, PA-C Electronically Signed   By: Irish Lack M.D.   On: 08/26/2016 11:05   US Paracentesis  Result Date: 08/13/2016 INDICATION: Alcoholic cirrhosis, recurrent ascites. Request made for therapeutic paracentesis. EXAM: ULTRASOUND GUIDED THERAPEUTIC PARACENTESIS MEDICATIONS: None. COMPLICATIONS: None immediate. PROCEDURE: Informed written consent was obtained from the patient after a discussion of the risks, benefits and alternatives to treatment. A timeout was performed prior to the initiation of the procedure. Initial ultrasound scanning demonstrates a large amount of ascites within the right lower abdominal quadrant. The right lower abdomen was prepped and draped in the usual sterile fashion. 1% lidocaine was used for local anesthesia. Following this, a Yueh catheter was introduced. An ultrasound image was saved for documentation purposes. The paracentesis was performed. The catheter was removed and a dressing was applied. The patient tolerated the procedure well without immediate post procedural complication. FINDINGS: A total of approximately 7.2 liters of yellow fluid was removed. IMPRESSION: Successful ultrasound-guided therapeutic paracentesis yielding 7.2 liters of peritoneal fluid. Only the above amount of fluid was removed today secondary to pt hypotension. Read by: Jeananne Rama, PA-C  Electronically Signed   By: Gilmer Mor D.O.  On: 08/13/2016 13:14   Dg Chest Portable 1 View  Result Date: 09/01/2016 CLINICAL DATA:  Altered mental status. Abdominal distention. Bradypnea. EXAM: PORTABLE CHEST 1 VIEW COMPARISON:  Chest radiographs 07/21/2016 FINDINGS: Low lung volumes. Patient's hand partially obscures evaluation of the left lung base. Improved left basilar aeration with probable residual small volume pleural fluid. Streaky bibasilar opacities likely atelectasis. Normal heart size. Unchanged mediastinal contours. No pneumothorax. No acute osseous abnormality. IMPRESSION: Low lung volumes with small left pleural effusion, decreased in size from radiographs 6 weeks prior. Streaky bibasilar atelectasis. Electronically Signed   By: Rubye Oaks M.D.   On: 09/01/2016 21:20    Lab Data:  CBC:  Recent Labs Lab 09/01/16 2004 09/02/16 0217 09/03/16 0358 09/04/16 0428 09/05/16 0307  WBC 9.0 7.1 9.7 14.8* 15.3*  NEUTROABS 6.4  --   --   --   --   HGB 8.0* 7.5* 9.4* 10.4* 10.2*  HCT 22.9* 21.5* 27.2* 30.0* 30.7*  MCV 84.8 84.6 85.3 85.7 87.5  PLT 183 169 186 225 249   Basic Metabolic Panel:  Recent Labs Lab 09/02/16 0217 09/03/16 0358 09/04/16 0428 09/05/16 0307 09/06/16 0621  NA 130* 133* 136 131* 131*  K 5.4* 5.5* 5.4* 4.9 3.9  CL 110 113* 116* 110 110  CO2 13* 15* 10* 10* 14*  GLUCOSE 95 69 104* 101* 114*  BUN 96* 91* 86* 81* 65*  CREATININE 2.15* 2.00* 1.90* 1.63* 1.19*  CALCIUM 7.0* 7.2* 7.8* 7.7* 7.8*   GFR: Estimated Creatinine Clearance: 50.4 mL/min (by C-G formula based on SCr of 1.19 mg/dL (H)). Liver Function Tests:  Recent Labs Lab 09/02/16 0217 09/03/16 0358 09/04/16 0428 09/05/16 0307 09/06/16 0621  AST 25 29 37 40 57*  ALT 12* 14 16 18 29   ALKPHOS 65 65 66 75 81  BILITOT 1.4* 1.5* 1.4* 1.0 1.2  PROT 5.1* 5.0* 5.4* 5.3* 5.0*  ALBUMIN 1.8* 1.5* 1.5* 1.5* 1.4*   No results for input(s): LIPASE, AMYLASE in the last 168  hours.  Recent Labs Lab 09/01/16 2004 09/04/16 0428 09/06/16 0621  AMMONIA 150* 75* 27   Coagulation Profile:  Recent Labs Lab 09/03/16 1832  INR 1.68   Cardiac Enzymes: No results for input(s): CKTOTAL, CKMB, CKMBINDEX, TROPONINI in the last 168 hours. BNP (last 3 results) No results for input(s): PROBNP in the last 8760 hours. HbA1C: No results for input(s): HGBA1C in the last 72 hours. CBG:  Recent Labs Lab 09/01/16 1941 09/02/16 2314 09/03/16 0804 09/03/16 1042 09/03/16 1912  GLUCAP 99 83 64* 112* 108*   Lipid Profile: No results for input(s): CHOL, HDL, LDLCALC, TRIG, CHOLHDL, LDLDIRECT in the last 72 hours. Thyroid Function Tests: No results for input(s): TSH, T4TOTAL, FREET4, T3FREE, THYROIDAB in the last 72 hours. Anemia Panel: No results for input(s): VITAMINB12, FOLATE, FERRITIN, TIBC, IRON, RETICCTPCT in the last 72 hours. Urine analysis:    Component Value Date/Time   COLORURINE YELLOW 09/01/2016 2143   APPEARANCEUR CLEAR 09/01/2016 2143   LABSPEC 1.010 09/01/2016 2143   PHURINE 5.0 09/01/2016 2143   GLUCOSEU NEGATIVE 09/01/2016 2143   HGBUR NEGATIVE 09/01/2016 2143   BILIRUBINUR NEGATIVE 09/01/2016 2143   BILIRUBINUR small 12/27/2011 1031   KETONESUR NEGATIVE 09/01/2016 2143   PROTEINUR NEGATIVE 09/01/2016 2143   UROBILINOGEN 0.2 12/27/2011 1031   NITRITE NEGATIVE 09/01/2016 2143   LEUKOCYTESUR NEGATIVE 09/01/2016 2143     Wanda Cellucci M.D. [facility] 09/08/2016, 12:25 PM  Pager: [phone] Between 7am to 7pm - call Pager - [phone]  After 7pm go to [email or web address] - password [email or web address]  Call night coverage person covering after 7pm

## 2016-09-08 NOTE — Progress Notes (Signed)
Notified by Delmer Islamebbie, CMRN of family request [facility]. Chart and patient information currently under review to confirm hospice eligibility.   Spoke with patient, at bedside to initiate education related to hospice philosophy, services and team approach to care. Patient did not want to speak about hospice and asked that I call her spouse, Brett CanalesSteve.  Called spouse to discuss services. Spouse verbalized understanding of the information provided. Per discussion, [facility]   Please send signed, completed DNR form home with patient.  Patient will need prescriptions for discharge comfort medications.  DME needs discussed and family requested hospital bed with 1/2 rails and OBT. Marland Kitchen.  [facility] equipment manager Jewel Kizzie BaneHughes notified and will contact [facility] to arrange delivery to the home.  The home address has been verified and is correct in the chart; Brett CanalesSteve is family member to be contacted to arrange time of delivery.   [facility]erral [facility] aware of the above.  Completed discharge summary will need [facility] at [phone] when final.   Please notify [facility] when patient is ready to leave unit at discharge-call [phone].  [facility] information and contact numbers [facility] and patient during visit.   Please call with any questions.  Thank You,  Hessie KnowsStacie Wilkinson RN, BSN  [facility] Hospital Liaison  [phone]

## 2016-09-08 NOTE — Progress Notes (Signed)
Initial Nutrition Assessment  DOCUMENTATION CODES:   Severe malnutrition in context of chronic illness  INTERVENTION:  Premier Protein BID, each supplement provides 160kcal and 30g protein.   Unjury chicken Soup BID, Each serving provides 100kcal and 21g protein   NUTRITION DIAGNOSIS:   Malnutrition related to chronic illness, End-Stage Liver Disease as evidenced by meal completion < 50%, moderate depletion of body fat, severe depletion of muscle mass, 20 percent weight loss in 4 months.  GOAL:   Patient will meet greater than or equal to 90% of their needs  MONITOR:   PO intake, Supplement acceptance, Labs, Weight trends, Skin  REASON FOR ASSESSMENT:   Malnutrition Screening Tool    ASSESSMENT:   56 y.o. female with a history of decompensated alcoholic cirrhosis, Hepatic encephalopathy, ascites who presented with altered mental status and lethargy, and was found to be in shock with suspected sepsis.   Met with pt in room today, pt reports poor appetite and oral intake for 6 months pta. Pt with ESLD and ascites. Pt currently eating <50% meals in hospital. Pt reports that she tried to drink Ensure but it upset her stomach and is too sweet. Pt with multiple pressure injuries and severe malnutrition. Per chart, pt has lost 38lbs(20%) in 4 months. This is severe. Per MD note, pt possibly to transition to comfort care. RD will order supplements.    Medications reviewed and include: Heparin, lactulose, fentanyl  Labs reviewed: Na 131(L), C02 14(L), BUN 65(H), creat 1.19(H), Ca 7.8(L) adj. 9.88 wnl, Alb 1.4(L), AST 57(H) WBC- 15.3(H)  Nutrition-Focused physical exam completed. Findings are moderate fat depletion, severe muscle depletion, and moderate to severe edema.   Diet Order:  Diet regular Room service appropriate? Yes; Fluid consistency: Thin  Skin:   Stage II leg, Stage II buttocks, deep tissue injury heel   Last BM:  2/5 (rectal tube)  Height:   Ht Readings from Last  1 Encounters:  09/05/16 '5\' 4"'$  (1.626 m)    Weight:   Wt Readings from Last 1 Encounters:  09/05/16 152 lb 12.5 oz (69.3 kg)    Ideal Body Weight:  54.5 kg  BMI:  Body mass index is 26.22 kg/m.  Estimated Nutritional Needs:   Kcal:  1600-1900kcal/day   Protein:  118-138g/day   Fluid:  per MD  EDUCATION NEEDS:   No education needs identified at this time  Koleen Distance, RD, LDN Pager #[phone]

## 2016-09-08 NOTE — Care Management Note (Addendum)
Case Management Note  Patient Details  Name: Christine [name] MRN: [id number] Date of Birth: 1960/08/21  Subjective/Objective:                 Spoke with patient's husband Brett CanalesSteve [phone] and friend Chipper OmanKathy Horne [phone]). CM received verbal permission to make referral to [facility]. [facility] clinical liaison [facility]. Informed Cordelia PenSherry that patient would need PTAR arranged in coordinance with hospital bed.    Action/Plan:  Anticipate DC Wednesday with home hospice.   Addendum 2/7 10:45 Spoke with spouse. He confirmed he would be home today. Confirmed address. Spoke with [facility], first home visit to be tonight at 7:00pm, and hospital bed to be delivered btwn 2-5:00pm. Arranged pick up by PTAR for 5:00 (estimate). PTAR packet on front of shadow chart.   Expected Discharge Date:                  Expected Discharge Plan:  Home w Hospice Care  In-House Referral:  Clinical Social Work  Discharge planning Services  CM Consult  Post Acute Care Choice:    Choice offered to:     DME Arranged:    DME Agency:     HH Arranged:    HH Agency:  [facility]  Status of Service:  In process, will continue to follow  If discussed at Long Length of Stay Meetings, dates discussed:    Additional Comments:  Lawerance SabalDebbie Carlyann Placide, RN 09/08/2016, 2:02 PM

## 2016-09-08 NOTE — [place] FL2 (Signed)
[place] MEDICAID FL2 LEVEL OF CARE SCREENING TOOL     IDENTIFICATION  Patient Name: Christine [name] Birthdate: [birth year] Sex: female Admission Date (Current Location): 09/01/2016  [facility] and [place] Number:  Producer, television/film/video and Address:  [facility], [address] [place], [place] [address]      Provider Number: [id number]  Attending Physician Name and Address:  Cathren Harsh, MD  Relative Name and Phone Number:       Current Level of Care: Hospital Recommended Level of Care: Skilled Nursing Facility Prior Approval Number:    Date Approved/Denied:   PASRR Number: [id number]  Discharge Plan: SNF    Current Diagnoses: Patient Active Problem List   Diagnosis Date Noted  . Palliative care encounter   . Goals of care, counseling/discussion   . Hepatic encephalopathy (HCC) 09/01/2016  . Shock (HCC) 09/01/2016  . Hyperkalemia 09/01/2016  . AKI (acute kidney injury) (HCC) 09/01/2016  . SBP (spontaneous bacterial peritonitis) (HCC) 08/21/2016  . Depression 07/31/2016  . Abdominal distention   . Ascites due to alcoholic cirrhosis (HCC)   . Weakness   . Protein-calorie malnutrition, severe 07/24/2016  . Pressure injury of skin 07/21/2016  . Scoliosis 04/29/2016  . History of gout 04/29/2016  . Psoriasis 04/29/2016  . Folic acid deficiency 04/24/2016  . Obesity 04/24/2016  . Hypokalemia 04/19/2016  . Alcohol abuse 04/19/2016  . Abnormal liver function 04/19/2016  . Acute alcohol intoxication (HCC) 04/19/2016  . Alcoholic cirrhosis of liver with ascites (HCC) 04/19/2016  . Anemia of chronic disease 04/19/2016  . Hyperammonemia (HCC) 04/19/2016  . Troponin level elevated 04/19/2016  . Lactic acidosis 04/19/2016    Orientation RESPIRATION BLADDER Height & Weight     Self, Place, Situation  Normal Incontinent, Indwelling catheter Weight: 152 lb 12.5 oz (69.3 kg) Height:  5\' 4"  (162.6 cm)  BEHAVIORAL SYMPTOMS/MOOD NEUROLOGICAL  BOWEL NUTRITION STATUS      Incontinent (rectal tube) Diet (regular)  AMBULATORY STATUS COMMUNICATION OF NEEDS Skin   Total Care Verbally Normal                       Personal Care Assistance Level of Assistance  Bathing, Dressing, Feeding Bathing Assistance: Maximum assistance Feeding assistance: Maximum assistance Dressing Assistance: Maximum assistance     Functional Limitations Info             SPECIAL CARE FACTORS FREQUENCY  PT (By licensed PT), OT (By licensed OT)     PT Frequency: 5/wk OT Frequency: 5/wk            Contractures      Additional Factors Info  Code Status, Allergies, Isolation Precautions Code Status Info: DNR Allergies Info: Ibuprofen, Sulfa Antibiotics     Isolation Precautions Info: MRSA     Current Medications (09/08/2016):  This is the current hospital active medication list Current Facility-Administered Medications  Medication Dose Route Frequency Provider Last Rate Last Dose  . feeding supplement (ENSURE ENLIVE) (ENSURE ENLIVE) liquid 237 mL  237 mL Oral BID BM Ripudeep K Rai, MD   237 mL at 09/08/16 0832  . heparin injection 5,000 Units  5,000 Units Subcutaneous Q8H Ripudeep Anistyn Graddy Luo, MD   5,000 Units at 09/08/16 0546  . lactulose (CHRONULAC) 10 GM/15ML solution 30 g  30 g Oral TID [name], MD   30 g at 09/08/16 0835  . LORazepam (ATIVAN) injection 0.5-1 mg  0.5-1 mg Intravenous Q4H PRN Elpidio Eric  Sharon SellerMcClung, MD   1 mg at 09/08/16 1125  . MEDLINE mouth rinse  15 mL Mouth Rinse BID Leatha Gildingostin M Gherghe, MD   15 mL at 09/08/16 1000  . midodrine (PROAMATINE) tablet 10 mg  10 mg Oral TID WC Lonia BloodJeffrey T McClung, MD   10 mg at 09/08/16 [id number]  . oxyCODONE (Oxy IR/ROXICODONE) immediate release tablet 5 mg  5 mg Oral Q4H PRN Gene Freeman, MD      . sodium chloride flush (NS) 0.9 % injection 3 mL  3 mL Intravenous Q12H Alberteen Samhristopher P Danford, MD   3 mL at 09/08/16 1000     Discharge Medications: Please see discharge summary for a list of  discharge medications.  Relevant Imaging Results:  Relevant Lab Results:   Additional Information SS # [id number]  Burna SisUris, Reed Eifert H, LCSW

## 2016-09-08 NOTE — Progress Notes (Signed)
Palliative Care Progress Note  Reason for encounter: Goals of care in light of [place] liver disease and shock  I met today with patient's husband in conjunction with Tammy Sours, LCSW.  Ms. Ketterman is able to participate some in conversation, but she is not really able to participate in decisionmaking.  We reviewed her clinical course over the past several days including the fact that she remains very weak and may never get to a point where she is able to regain her strength or functional status.  Her husband believes that this is likely the case.  We discussed pathways forward including continuation of attempts at therapeutic interventions versus refocusing care on comfort with the goal of being at home.  - Her husband understands that she is critically ill. We discussed pathways forward including potential for home with hospice support. - Her husband wants to pursue discharge home with the support of hospice. - Referral placed to care management for hospice options - Transitioned pain regimen to fentanyl 12.89mg patch with oxycodone '5mg'$  Q4 hours prn.  She will continue to require titration of pain regimen through hospice on discharge.  Time Spent: 25 minutes  Greater than 50%  of this time was spent counseling and coordinating care related to the above assessment and plan.   GMicheline Rough MD C[facility] SpringsTeam [phone]

## 2016-09-09 ENCOUNTER — Inpatient Hospital Stay (HOSPITAL_COMMUNITY): Payer: BLUE CROSS/BLUE SHIELD

## 2016-09-09 LAB — COMPREHENSIVE METABOLIC PANEL
ALBUMIN: 1.7 g/dL — AB (ref 3.5–5.0)
ALK PHOS: 100 U/L (ref 38–126)
ALT: 24 U/L (ref 14–54)
AST: 43 U/L — AB (ref 15–41)
Anion gap: 12 (ref 5–15)
BILIRUBIN TOTAL: 1.4 mg/dL — AB (ref 0.3–1.2)
BUN: 53 mg/dL — AB (ref 6–20)
CALCIUM: 7.8 mg/dL — AB (ref 8.9–10.3)
CO2: 12 mmol/L — ABNORMAL LOW (ref 22–32)
CREATININE: 1.08 mg/dL — AB (ref 0.44–1.00)
Chloride: 105 mmol/L (ref 101–111)
GFR calc Af Amer: >60 mL/min (ref >60)
GFR calc non Af Amer: 56 mL/min — ABNORMAL LOW (ref >60)
GLUCOSE: 91 mg/dL (ref 65–99)
Potassium: 3.5 mmol/L (ref 3.5–5.1)
Sodium: 129 mmol/L — ABNORMAL LOW (ref 135–145)
TOTAL PROTEIN: 5.7 g/dL — AB (ref 6.5–8.1)

## 2016-09-09 LAB — CBC
HCT: 27 % — ABNORMAL LOW (ref 36.0–46.0)
Hemoglobin: 9.3 g/dL — ABNORMAL LOW (ref 12.0–15.0)
MCH: 28.8 pg (ref 26.0–34.0)
MCHC: 34.4 g/dL (ref 30.0–36.0)
MCV: 83.6 fL (ref 78.0–100.0)
PLATELETS: 220 10*3/uL (ref 150–400)
RBC: 3.23 MIL/uL — ABNORMAL LOW (ref 3.87–5.11)
RDW: 15.1 % (ref 11.5–15.5)
WBC: 15.9 10*3/uL — ABNORMAL HIGH (ref 4.0–10.5)

## 2016-09-09 LAB — AMMONIA: Ammonia: 29 umol/L (ref 9–35)

## 2016-09-09 MED ORDER — ALBUMIN HUMAN 5 % IV SOLN
25.0000 g | Freq: Once | INTRAVENOUS | Status: AC
  Administered 2016-09-09: 25 g via INTRAVENOUS
  Filled 2016-09-09: qty 500

## 2016-09-09 MED ORDER — LIDOCAINE HCL 1 % IJ SOLN
INTRAMUSCULAR | Status: AC
  Filled 2016-09-09: qty 20

## 2016-09-09 MED ORDER — FENTANYL CITRATE (PF) 100 MCG/2ML IJ SOLN
12.5000 ug | Freq: Once | INTRAMUSCULAR | Status: AC
  Administered 2016-09-09: 12.5 ug via INTRAVENOUS
  Filled 2016-09-09: qty 2

## 2016-09-09 MED ORDER — ALBUTEROL SULFATE (2.5 MG/3ML) 0.083% IN NEBU
2.5000 mg | INHALATION_SOLUTION | RESPIRATORY_TRACT | Status: DC | PRN
  Administered 2016-09-09 – 2016-09-10 (×3): 2.5 mg via RESPIRATORY_TRACT
  Filled 2016-09-09 (×3): qty 3

## 2016-09-09 NOTE — Progress Notes (Signed)
Pt complaining of tightness in her chest and SOB. Pt is also wheezing. MD made aware and breathing treatment ordered. Will continue to monitor.

## 2016-09-09 NOTE — Procedures (Signed)
Ultrasound-guided therapeutic paracentesis performed yielding 7.6 liters of yellow colored fluid. No immediate complications.  Christine [name] E 4:55 PM 09/09/2016

## 2016-09-09 NOTE — Progress Notes (Signed)
[facility]                                                                              Patient Demographics  Christine [name], is a 56 y.o. female, DOB - 12-Feb-1961, ZOX:[id number]  Admit date - 09/01/2016   Admitting Physician Alberteen Sam, MD  Outpatient Primary MD for the patient is [name], MD  Outpatient specialists:   LOS - 8  days    Chief Complaint  Patient presents with  . Altered Mental Status       Brief summary   56 y.o.femalewith a history of decompensated alcoholic cirrhosis, Hepatic encephalopathy, ascites who presented with altered mental status and lethargy, and was found to be in shock with suspected sepsis.   Assessment & Plan    Principal diagnoses  Shock - Hypotension  - In the setting of decompensated alcoholic cirrhosis, hepatic encephalopathy and ascites, end-stage liver disease. Unclear cause, septic versus hypovolemia - Status post albumin  - Discontinued antibiotics with ascites fluid not convincing for SBP - baseline low BP, systolic BP appears to be ~100 per prior admits - increase midodrine to max dose and follow   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 - continue high dose lactulose  - Patient is alert and awake, however overall prognosis poor, no significant improvement in the overall status in the last 72 hours.  Ascites - attempt therapeutic paracentesis today for comfort, albumin 25 g with paracentesis  1 of 2 Blood cx + for Meth resistant Coag Neg Staph  - suspect this is a contaminant - discontinued antibiotics - follow clinically  Goals of care - Husband wishes to continue conservative medical care - Palliative medicine was consulted, appreciate recommendations, no aggressive interventions in the event of decompensation, if she continues to worsen, goal will be focused on comfort. - Medications for pain or shortness of breath not to be held due to concerns  for hypotension if needed to ensure her comfort.  Hyponatremia - in the setting of fluid overload/liver disease -Sodium level stable, cannot diurese due to hypotension  Hyperkalemia - Improved with increased stool output  Acute kidney injury - Renal function improved, continue to follow trend - avoid nephrotoxic meds   Anemia - of chronic liver disease - Follow H&H  Urinary retention  - Foley catheter placed   Code Status: DO NOT RESUSCITATE status DVT Prophylaxis: SCDs Family Communication: Discussed in detail with the patient, all imaging results, lab results explained to the patient    Disposition Plan: Home with hospice in a.m.  Time Spent in minutes 25 minutes  Procedures:  Paracentesis 1/30  Consultants:   Palliative medicine  Antimicrobials:  Vancomycin 1/29 > 1/31 Zosyn 1/29 > 1/31   Medications  Scheduled Meds: . albumin human  25 g Intravenous Once  . feeding supplement (ENSURE ENLIVE)  237 mL Oral BID BM  . fentaNYL  12.5 mcg Transdermal Q72H  . heparin subcutaneous  5,000 Units Subcutaneous Q8H  . lactulose  30 g Oral TID  . mouth rinse  15 mL Mouth Rinse BID  . midodrine  10 mg Oral TID WC  .  protein supplement shake  11 oz Oral BID BM  . protein supplement  2 oz Oral BID  . sodium chloride flush  3 mL Intravenous Q12H   Continuous Infusions: PRN Meds:.albuterol, LORazepam, oxyCODONE   Antibiotics   Anti-infectives    Start     Dose/Rate Route Frequency Ordered Stop   09/02/16 2300  vancomycin (VANCOCIN) IVPB 750 mg/150 ml premix  Status:  Discontinued     750 mg 150 mL/hr over 60 Minutes Intravenous Every 24 hours 09/01/16 2240 09/03/16 1653   09/02/16 0700  piperacillin-tazobactam (ZOSYN) IVPB 3.375 g  Status:  Discontinued     3.375 g 12.5 mL/hr over 240 Minutes Intravenous Every 8 hours 09/01/16 2240 09/03/16 1653   09/01/16 2245  piperacillin-tazobactam (ZOSYN) IVPB 3.375 g     3.375 g 100 mL/hr over 30 Minutes Intravenous   Once 09/01/16 2236 09/01/16 2324   09/01/16 2245  vancomycin (VANCOCIN) IVPB 1000 mg/200 mL premix     1,000 mg 200 mL/hr over 60 Minutes Intravenous  Once 09/01/16 2236 09/02/16 0036        Subjective:   Christine [name] was seen and examined today. Complaining of shortness of breath today. Abdominal distention.  Abdominal pain controlled. No fevers. Currently no shortness of breath. Patient denies dizziness, chest pain, N/V, new weakness, numbess, tingling.   Objective:   Vitals:   09/08/16 1433 09/08/16 2136 09/09/16 0523 09/09/16 1040  BP: 125/70 109/62 (!) 109/50   Pulse: 96 100 (!) 105   Resp: 18 (!) 22 20   Temp: 98.2 F (36.8 C) 98.3 F (36.8 C) 98 F (36.7 C)   TempSrc: Oral Oral Oral   SpO2: 100% 100% 100% 98%  Weight:      Height:       No intake or output data in the 24 hours ending 09/09/16 1309   Wt Readings from Last 3 Encounters:  09/05/16 69.3 kg (152 lb 12.5 oz)  08/29/16 78 kg (172 lb)  08/25/16 78 kg (172 lb)     Exam  General: Alert and oriented x 3, NAD  HEENT:    Neck:   Cardiovascular: S1 S2 clear, RRR  Respiratory: CTAB anteriorly  Gastrointestinal: Soft, nontender, ++distended, + bowel sounds  Ext: no cyanosis clubbing, 2+ edema  Neuro: no focal neurological deficits  Skin: No rashes  Psych: Normal affect and demeanor, alert and oriented x3    Data Reviewed:  I have personally reviewed following labs and imaging studies  Micro Results Recent Results (from the past 240 hour(s))  Blood culture (routine x 2)     Status: None   Collection Time: 09/01/16  7:30 PM  Result Value Ref Range Status   Specimen Description BLOOD LEFT HAND  Final   Special Requests BOTTLES DRAWN AEROBIC ONLY 7CC  Final   Culture NO GROWTH 5 DAYS  Final   Report Status 09/06/2016 FINAL  Final  Blood culture (routine x 2)     Status: Abnormal   Collection Time: 09/01/16  8:07 PM  Result Value Ref Range Status   Specimen Description BLOOD RIGHT HAND   Final   Special Requests IN PEDIATRIC BOTTLE 5CC  Final   Culture  Setup Time   Final    GRAM POSITIVE COCCI IN CLUSTERS IN PEDIATRIC BOTTLE CRITICAL RESULT CALLED TO, READ BACK BY AND VERIFIED WITH: ADagoberto Ligas.D. 17:50 09/02/16 ([email or web address])    Culture (A)  Final    STAPHYLOCOCCUS SPECIES (COAGULASE NEGATIVE) THE SIGNIFICANCE OF ISOLATING THIS  ORGANISM FROM A SINGLE SET OF BLOOD CULTURES WHEN MULTIPLE SETS ARE DRAWN IS UNCERTAIN. PLEASE NOTIFY THE MICROBIOLOGY DEPARTMENT WITHIN ONE WEEK IF SPECIATION AND SENSITIVITIES ARE REQUIRED.    Report Status 09/03/2016 FINAL  Final  Blood Culture ID Panel (Reflexed)     Status: Abnormal   Collection Time: 09/01/16  8:07 PM  Result Value Ref Range Status   Enterococcus species NOT DETECTED NOT DETECTED Final   Listeria monocytogenes NOT DETECTED NOT DETECTED Final   Staphylococcus species DETECTED (A) NOT DETECTED Final    Comment: Methicillin (oxacillin) resistant coagulase negative staphylococcus. Possible blood culture contaminant (unless isolated from more than one blood culture draw or clinical case suggests pathogenicity). No antibiotic treatment is indicated for blood  culture contaminants. CRITICAL RESULT CALLED TO, READ BACK BY AND VERIFIED WITH: ADagoberto Ligas.D. 17:50 09/02/16 (wilsonm)    Staphylococcus aureus NOT DETECTED NOT DETECTED Final   Methicillin resistance DETECTED (A) NOT DETECTED Final    Comment: CRITICAL RESULT CALLED TO, READ BACK BY AND VERIFIED WITH: ADagoberto Ligas.D. 17:50 09/02/16 (wilsonm)    Streptococcus species NOT DETECTED NOT DETECTED Final   Streptococcus agalactiae NOT DETECTED NOT DETECTED Final   Streptococcus pneumoniae NOT DETECTED NOT DETECTED Final   Streptococcus pyogenes NOT DETECTED NOT DETECTED Final   Acinetobacter baumannii NOT DETECTED NOT DETECTED Final   Enterobacteriaceae species NOT DETECTED NOT DETECTED Final   Enterobacter cloacae complex NOT DETECTED NOT DETECTED Final    Escherichia coli NOT DETECTED NOT DETECTED Final   Klebsiella oxytoca NOT DETECTED NOT DETECTED Final   Klebsiella pneumoniae NOT DETECTED NOT DETECTED Final   Proteus species NOT DETECTED NOT DETECTED Final   Serratia marcescens NOT DETECTED NOT DETECTED Final   Haemophilus influenzae NOT DETECTED NOT DETECTED Final   Neisseria meningitidis NOT DETECTED NOT DETECTED Final   Pseudomonas aeruginosa NOT DETECTED NOT DETECTED Final   Candida albicans NOT DETECTED NOT DETECTED Final   Candida glabrata NOT DETECTED NOT DETECTED Final   Candida krusei NOT DETECTED NOT DETECTED Final   Candida parapsilosis NOT DETECTED NOT DETECTED Final   Candida tropicalis NOT DETECTED NOT DETECTED Final  Culture, body fluid-bottle     Status: None   Collection Time: 09/02/16 11:49 AM  Result Value Ref Range Status   Specimen Description FLUID PERITONEAL  Final   Special Requests NONE  Final   Culture NO GROWTH 5 DAYS  Final   Report Status 09/07/2016 FINAL  Final  Gram stain     Status: None   Collection Time: 09/02/16 11:49 AM  Result Value Ref Range Status   Specimen Description FLUID PERITONEAL  Final   Special Requests NONE  Final   Gram Stain   Final    RARE WBC PRESENT, PREDOMINANTLY PMN NO ORGANISMS SEEN    Report Status 09/02/2016 FINAL  Final  MRSA PCR Screening     Status: Abnormal   Collection Time: 09/02/16  3:27 PM  Result Value Ref Range Status   MRSA by PCR POSITIVE (A) NEGATIVE Final    Comment:        The GeneXpert MRSA Assay (FDA approved for NASAL specimens only), is one component of a comprehensive MRSA colonization surveillance program. It is not intended to diagnose MRSA infection nor to guide or monitor treatment for MRSA infections. RESULT CALLED TO, READ BACK BY AND VERIFIED WITHGwenith Spitz RN [id number] 09/02/16 A BROWNING     Radiology Reports US Paracentesis  Result Date: 09/02/2016 INDICATION: Decompensated alcoholic cirrhosis. Altered  mental status. Hypotension.  Acute kidney injury. Recurrent ascites. Request diagnostic and therapeutic paracentesis. EXAM: ULTRASOUND GUIDED RIGHT LOWER QUADRANT PARACENTESIS MEDICATIONS: None. COMPLICATIONS: None immediate. PROCEDURE: Informed written consent was obtained from the patient after a discussion of the risks, benefits and alternatives to treatment. A timeout was performed prior to the initiation of the procedure. Initial ultrasound scanning demonstrates a large amount of ascites within the right lower abdominal quadrant. The right lower abdomen was prepped and draped in the usual sterile fashion. 1% lidocaine with epinephrine was used for local anesthesia. Following this, a 19 gauge, 7-cm, Yueh catheter was introduced. An ultrasound image was saved for documentation purposes. The paracentesis was performed. The catheter was removed and a dressing was applied. The patient tolerated the procedure well without immediate post procedural complication. FINDINGS: A total of approximately 2.5 L of clear yellow fluid was removed. Procedure was terminated at this time due to hypotension. Samples were sent to the laboratory as requested by the clinical team. IMPRESSION: Successful ultrasound-guided paracentesis yielding 2.5 liters of peritoneal fluid. Read by: Brayton El PA-C Electronically Signed   By: Simonne Come M.D.   On: 09/02/2016 11:55   US Paracentesis  Result Date: 08/26/2016 INDICATION: Alcoholic cirrhosis, recurrent ascites. Request made for therapeutic paracentesis. EXAM: ULTRASOUND GUIDED THERAPEUTIC PARACENTESIS MEDICATIONS: None. COMPLICATIONS: None immediate. PROCEDURE: Informed written consent was obtained from the patient after a discussion of the risks, benefits and alternatives to treatment. A timeout was performed prior to the initiation of the procedure. Initial ultrasound scanning demonstrates a large amount of ascites within the right lower abdominal quadrant. The right lower abdomen was prepped and draped in  the usual sterile fashion. 1% lidocaine was used for local anesthesia. Following this, a Yueh catheter was introduced. An ultrasound image was saved for documentation purposes. The paracentesis was performed. The catheter was removed and a dressing was applied. The patient tolerated the procedure well without immediate post procedural complication. FINDINGS: A total of approximately 9.3 liters of yellow fluid was removed. IMPRESSION: Successful ultrasound-guided therapeutic paracentesis yielding 9.3 liters of peritoneal fluid. Read by: Jeananne Rama, PA-C Electronically Signed   By: Irish Lack M.D.   On: 08/26/2016 11:05   US Paracentesis  Result Date: 08/13/2016 INDICATION: Alcoholic cirrhosis, recurrent ascites. Request made for therapeutic paracentesis. EXAM: ULTRASOUND GUIDED THERAPEUTIC PARACENTESIS MEDICATIONS: None. COMPLICATIONS: None immediate. PROCEDURE: Informed written consent was obtained from the patient after a discussion of the risks, benefits and alternatives to treatment. A timeout was performed prior to the initiation of the procedure. Initial ultrasound scanning demonstrates a large amount of ascites within the right lower abdominal quadrant. The right lower abdomen was prepped and draped in the usual sterile fashion. 1% lidocaine was used for local anesthesia. Following this, a Yueh catheter was introduced. An ultrasound image was saved for documentation purposes. The paracentesis was performed. The catheter was removed and a dressing was applied. The patient tolerated the procedure well without immediate post procedural complication. FINDINGS: A total of approximately 7.2 liters of yellow fluid was removed. IMPRESSION: Successful ultrasound-guided therapeutic paracentesis yielding 7.2 liters of peritoneal fluid. Only the above amount of fluid was removed today secondary to pt hypotension. Read by: Jeananne Rama, PA-C Electronically Signed   By: Gilmer Mor D.O.   On: 08/13/2016 13:14     Dg Chest Portable 1 View  Result Date: 09/01/2016 CLINICAL DATA:  Altered mental status. Abdominal distention. Bradypnea. EXAM: PORTABLE CHEST 1 VIEW COMPARISON:  Chest radiographs 07/21/2016 FINDINGS: Low lung volumes. Patient's  hand partially obscures evaluation of the left lung base. Improved left basilar aeration with probable residual small volume pleural fluid. Streaky bibasilar opacities likely atelectasis. Normal heart size. Unchanged mediastinal contours. No pneumothorax. No acute osseous abnormality. IMPRESSION: Low lung volumes with small left pleural effusion, decreased in size from radiographs 6 weeks prior. Streaky bibasilar atelectasis. Electronically Signed   By: Rubye OaksMelanie  Ehinger M.D.   On: 09/01/2016 21:20    Lab Data:  CBC:  Recent Labs Lab 09/03/16 0358 09/04/16 0428 09/05/16 0307 09/09/16 0539  WBC 9.7 14.8* 15.3* 15.9*  HGB 9.4* 10.4* 10.2* 9.3*  HCT 27.2* 30.0* 30.7* 27.0*  MCV 85.3 85.7 87.5 83.6  PLT 186 225 249 220   Basic Metabolic Panel:  Recent Labs Lab 09/03/16 0358 09/04/16 0428 09/05/16 0307 09/06/16 0621 09/09/16 0539  NA 133* 136 131* 131* 129*  K 5.5* 5.4* 4.9 3.9 3.5  CL 113* 116* 110 110 105  CO2 15* 10* 10* 14* 12*  GLUCOSE 69 104* 101* 114* 91  BUN 91* 86* 81* 65* 53*  CREATININE 2.00* 1.90* 1.63* 1.19* 1.08*  CALCIUM 7.2* 7.8* 7.7* 7.8* 7.8*   GFR: Estimated Creatinine Clearance: 55.6 mL/min (by C-G formula based on SCr of 1.08 mg/dL (H)). Liver Function Tests:  Recent Labs Lab 09/03/16 0358 09/04/16 0428 09/05/16 0307 09/06/16 0621 09/09/16 0539  AST 29 37 40 57* 43*  ALT 14 16 18 29 24   ALKPHOS 65 66 75 81 100  BILITOT 1.5* 1.4* 1.0 1.2 1.4*  PROT 5.0* 5.4* 5.3* 5.0* 5.7*  ALBUMIN 1.5* 1.5* 1.5* 1.4* 1.7*   No results for input(s): LIPASE, AMYLASE in the last 168 hours.  Recent Labs Lab 09/04/16 0428 09/06/16 0621 09/09/16 0539  AMMONIA 75* 27 29   Coagulation Profile:  Recent Labs Lab 09/03/16 1832   INR 1.68   Cardiac Enzymes: No results for input(s): CKTOTAL, CKMB, CKMBINDEX, TROPONINI in the last 168 hours. BNP (last 3 results) No results for input(s): PROBNP in the last 8760 hours. HbA1C: No results for input(s): HGBA1C in the last 72 hours. CBG:  Recent Labs Lab 09/02/16 2314 09/03/16 0804 09/03/16 1042 09/03/16 1912  GLUCAP 83 64* 112* 108*   Lipid Profile: No results for input(s): CHOL, HDL, LDLCALC, TRIG, CHOLHDL, LDLDIRECT in the last 72 hours. Thyroid Function Tests: No results for input(s): TSH, T4TOTAL, FREET4, T3FREE, THYROIDAB in the last 72 hours. Anemia Panel: No results for input(s): VITAMINB12, FOLATE, FERRITIN, TIBC, IRON, RETICCTPCT in the last 72 hours. Urine analysis:    Component Value Date/Time   COLORURINE YELLOW 09/01/2016 2143   APPEARANCEUR CLEAR 09/01/2016 2143   LABSPEC 1.010 09/01/2016 2143   PHURINE 5.0 09/01/2016 2143   GLUCOSEU NEGATIVE 09/01/2016 2143   HGBUR NEGATIVE 09/01/2016 2143   BILIRUBINUR NEGATIVE 09/01/2016 2143   BILIRUBINUR small 12/27/2011 1031   KETONESUR NEGATIVE 09/01/2016 2143   PROTEINUR NEGATIVE 09/01/2016 2143   UROBILINOGEN 0.2 12/27/2011 1031   NITRITE NEGATIVE 09/01/2016 2143   LEUKOCYTESUR NEGATIVE 09/01/2016 2143     Janyth Riera M.D. [facility] 09/09/2016, 1:09 PM  Pager: [phone] Between 7am to 7pm - call Pager - [phone]  After 7pm go to [email or web address] - password [email or web address]  Call night coverage person covering after 7pm

## 2016-09-10 LAB — COMPREHENSIVE METABOLIC PANEL WITH GFR
ALT: 18 U/L (ref 14–54)
AST: 32 U/L (ref 15–41)
Albumin: 1.8 g/dL — ABNORMAL LOW (ref 3.5–5.0)
Alkaline Phosphatase: 84 U/L (ref 38–126)
Anion gap: 10 (ref 5–15)
BUN: 45 mg/dL — ABNORMAL HIGH (ref 6–20)
CO2: 14 mmol/L — ABNORMAL LOW (ref 22–32)
Calcium: 7.8 mg/dL — ABNORMAL LOW (ref 8.9–10.3)
Chloride: 108 mmol/L (ref 101–111)
Creatinine, Ser: 1 mg/dL (ref 0.44–1.00)
GFR calc Af Amer: >60 mL/min
GFR calc non Af Amer: >60 mL/min
Glucose, Bld: 88 mg/dL (ref 65–99)
Potassium: 3.8 mmol/L (ref 3.5–5.1)
Sodium: 132 mmol/L — ABNORMAL LOW (ref 135–145)
Total Bilirubin: 1 mg/dL (ref 0.3–1.2)
Total Protein: 5.2 g/dL — ABNORMAL LOW (ref 6.5–8.1)

## 2016-09-10 MED ORDER — MIDODRINE HCL 10 MG PO TABS: 10.0000 mg | ORAL_TABLET | Freq: Three times a day (TID) | ORAL | 3 refills | Status: DC

## 2016-09-10 MED ORDER — LORAZEPAM 1 MG PO TABS
1.0000 mg | ORAL_TABLET | Freq: Four times a day (QID) | ORAL | Status: DC | PRN
  Administered 2016-09-10 – 2016-09-11 (×2): 1 mg via ORAL
  Filled 2016-09-10 (×2): qty 1

## 2016-09-10 MED ORDER — LACTULOSE 10 GM/15ML PO SOLN: 30.0000 g | Freq: Three times a day (TID) | ORAL | 0 refills | Status: DC

## 2016-09-10 MED ORDER — LORAZEPAM 2 MG/ML IJ SOLN
1.0000 mg | Freq: Four times a day (QID) | INTRAMUSCULAR | Status: DC | PRN
Start: 2016-09-10 — End: 2016-09-10

## 2016-09-10 NOTE — Discharge Summary (Addendum)
Physician Discharge Summary   Patient ID: FLECIA SHUTTER MRN: [id number] DOB/AGE: 1961-06-14 56 y.o.  Admit date: 09/01/2016 Discharge date: 09/10/2016  Primary Care Physician:  [name], MD  Discharge Diagnoses:    . Hepatic encephalopathy (HCC) . Shock (HCC)Due to hypovolemia . Alcoholic cirrhosis of liver with ascites (HCC) . Anemia of chronic disease . Decompensated end-stage liver disease . Hyperkalemia   Hyponatremia . 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 Home hospice arranged 2. The patient will need therapeutic paracentesis for comfort, last done on 2/6  DIET: *Heart healthy diet    Allergies:   Allergies  Allergen Reactions  . Ibuprofen Other (See Comments)    Pt is unable to take due to her hernia.    . Sulfa Antibiotics Nausea Only and Rash     DISCHARGE MEDICATIONS: Current Discharge Medication List    START taking these medications   Details  midodrine (PROAMATINE) 10 MG tablet Take 1 tablet (10 mg total) by mouth 3 (three) times daily with meals. Qty: 90 tablet, Refills: 3      CONTINUE these medications which have CHANGED   Details  lactulose (CHRONULAC) 10 GM/15ML solution Take 45 mLs (30 g total) by mouth 3 (three) times daily. Qty: 240 mL, Refills: 0      CONTINUE these medications which have NOT CHANGED   Details  acetaminophen (TYLENOL) 325 MG tablet Take 650 mg by mouth every 6 (six) hours as needed for mild pain.    CALCIUM ALGINATE EX Apply 1 application topically See admin instructions. Apply to buttocks topically every day shift for wound care/ clean area with wound cleanser, apply calcium alginate and cover with dry dressing    diphenhydrAMINE (BENADRYL) 25 mg capsule Take 1 capsule (25 mg total) by mouth every 8 (eight) hours as needed  for itching.    fentaNYL (DURAGESIC - DOSED MCG/HR) 25 MCG/HR patch Place 1 patch (25 mcg total) onto the skin every 3 (three) days. Qty: 5 patch, Refills: 0    ferrous sulfate 325 (65 FE) MG tablet Take 325 mg by mouth daily with breakfast.    hydrocerin (EUCERIN) CREA Apply 1 application topically 2 (two) times daily. Apply to her skin, to maintain good moisture level    hydrOXYzine (ATARAX/VISTARIL) 10 MG tablet Take 10 mg by mouth 2 (two) times daily.    Multiple Vitamins-Minerals (DECUBI-VITE) CAPS Take 1 capsule by mouth daily.    Nutritional Supplements (NUTRITIONAL SUPPLEMENT PO) Take 60 mLs by mouth 2 (two) times daily. Med pass - house 2.0    oxyCODONE (OXY IR/ROXICODONE) 5 MG immediate release tablet Take 5 mg by mouth every 3 (three) hours as needed for moderate pain or severe pain. related to Alcoholic Cirrhosis of Liver with Ascites Refills: 0    pantoprazole (PROTONIX) 40 MG tablet Take 1 tablet (40 mg total) by mouth daily.    PROTEIN PO Take 30 mLs by mouth 2 (two) times daily.    thiamine 100 MG tablet Take 1 tablet (100 mg total) by mouth daily.    traZODone (DESYREL) 100 MG tablet Take 100 mg by mouth at bedtime.      STOP taking these medications     furosemide (LASIX) 20 MG tablet      spironolactone (  ALDACTONE) 100 MG tablet          Brief H and P: For complete details please refer to admission H and P, but in brief 56 y.o.femalewith a history of decompensated alcoholic cirrhosis, Hepatic encephalopathy, ascites who presented with altered mental status and lethargy, and was found to be in shock   Hospital Course:  Shock - Hypotension possibly due to hypovolemia - In the setting of decompensated alcoholic cirrhosis, hepatic encephalopathy and ascites, end-stage liver disease. Possibly due to hypovolemia. - Status post albumin  - Discontinuedantibiotics with ascites fluid not convincing for SBP - baseline low BP, systolic BP appears to be ~100 per  prior admits - increased midodrine to max dose and follow   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 - continue high dose lactulose  - Patient is alert and awake, however overall prognosis poor, no significant improvement in the overall status   Ascites -Ultrasound-guided therapy to take paracentesis done on 2/6 yielding 7.6 L, albumin 25 g also given.    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 - Medications for pain or shortness of breath not to be held due to concerns for hypotension if needed to ensure her comfort.  Hyponatremia - in the setting of fluid overload/liver disease, sodium 129 at the time of admission, now improved. -Sodium level stable, cannot diurese further due to hypotension  Hyperkalemia - Patient presented with potassium of 6.5, spironolactone discontinued.  - Improved with increased stool output. Potassium 3.8 at the time of discharge.  Acute kidney injury - Patient presented with creatinine of 2.2 at the time of admission, Renal function improved  - avoid nephrotoxic meds . Creatinine 1.0 at the time of discharge.  Anemia - of chronic liver disease - Follow H&H, hemoglobin 9.3.    Day of Discharge BP (!) 102/53 (BP Location: Right Arm)   Pulse (!) 104   Temp 97.7 F (36.5 C)   Resp 18   Ht 5\' 4"  (1.626 m)   Wt 69.3 kg (152 lb 12.5 oz)   SpO2 100%   BMI 26.22 kg/m   Physical Exam: General: Alert and awake oriented x3 not in any acute distress. HEENT: anicteric sclera, pupils reactive to light and accommodation, scleral icterus CVS: S1-S2 clear no murmur rubs or gallops Chest: clear to auscultation  bilaterally, no wheezing rales or rhonchi Abdomen: soft nontender, distended, normal bowel sounds Extremities: no cyanosis, clubbing or edema noted bilaterally    The results of significant diagnostics from this hospitalization (including imaging, microbiology, ancillary and laboratory) are listed below for reference.    LAB RESULTS: Basic Metabolic Panel:  Recent Labs Lab 09/09/16 0539 09/10/16 0546  NA 129* 132*  K 3.5 3.8  CL 105 108  CO2 12* 14*  GLUCOSE 91 88  BUN 53* 45*  CREATININE 1.08* 1.00  CALCIUM 7.8* 7.8*   Liver Function Tests:  Recent Labs Lab 09/09/16 0539 09/10/16 0546  AST 43* 32  ALT 24 18  ALKPHOS 100 84  BILITOT 1.4* 1.0  PROT 5.7* 5.2*  ALBUMIN 1.7* 1.8*   No results for input(s): LIPASE, AMYLASE in the last 168 hours.  Recent Labs Lab 09/06/16 [id number] 09/09/16 [phone]  AMMONIA 27 29   CBC:  Recent Labs Lab 09/05/16 0307 09/09/16 0539  WBC 15.3* 15.9*  HGB 10.2* 9.3*  HCT 30.7* 27.0*  MCV 87.5 83.6  PLT 249 220   Cardiac Enzymes: No results for input(s): CKTOTAL, CKMB, CKMBINDEX, TROPONINI in the last 168 hours. BNP: Invalid input(s): POCBNP CBG:  Recent Labs Lab 09/03/16 1912  GLUCAP 108*    Significant Diagnostic Studies:  [place] Paracentesis  Result Date: 09/02/2016 INDICATION: Decompensated alcoholic cirrhosis. Altered mental status. Hypotension. Acute kidney injury. Recurrent ascites. Request diagnostic and therapeutic paracentesis. EXAM: ULTRASOUND GUIDED RIGHT LOWER QUADRANT PARACENTESIS MEDICATIONS: None. COMPLICATIONS: None immediate. PROCEDURE: Informed written consent was obtained from the patient after a discussion of the risks, benefits and alternatives to treatment. A timeout was performed prior to the initiation of the procedure. Initial ultrasound scanning demonstrates a large amount of ascites within the right lower abdominal quadrant. The right lower abdomen was prepped and draped in the usual sterile fashion. 1%  lidocaine with epinephrine was used for local anesthesia. Following this, a 19 gauge, 7-cm, Yueh catheter was introduced. An ultrasound image was saved for documentation purposes. The paracentesis was performed. The catheter was removed and a dressing was applied. The patient tolerated the procedure well without immediate post procedural complication. FINDINGS: A total of approximately 2.5 L of clear yellow fluid was removed. Procedure was terminated at this time due to hypotension. Samples were sent to the laboratory as requested by the clinical team. IMPRESSION: Successful ultrasound-guided paracentesis yielding 2.5 liters of peritoneal fluid. Read by: Brayton ElKevin Bruning PA-C Electronically Signed   By: Simonne ComeJohn  Watts M.D.   On: 09/02/2016 11:55   Dg Chest Portable 1 View  Result Date: 09/01/2016 CLINICAL DATA:  Altered mental status. Abdominal distention. Bradypnea. EXAM: PORTABLE CHEST 1 VIEW COMPARISON:  Chest radiographs 07/21/2016 FINDINGS: Low lung volumes. Patient's hand partially obscures evaluation of the left lung base. Improved left basilar aeration with probable residual small volume pleural fluid. Streaky bibasilar opacities likely atelectasis. Normal heart size. Unchanged mediastinal contours. No pneumothorax. No acute osseous abnormality. IMPRESSION: Low lung volumes with small left pleural effusion, decreased in size from radiographs 6 weeks prior. Streaky bibasilar atelectasis. Electronically Signed   By: Rubye OaksMelanie  Ehinger M.D.   On: 09/01/2016 21:20       Disposition and Follow-up: Discharge Instructions    Diet - low sodium heart healthy    Complete by:  As directed    Increase activity slowly    Complete by:  As directed        DISPOSITION: home with hospice    DISCHARGE FOLLOW-UP Follow-up Information    GARBA,LAWAL, MD. Schedule an appointment as soon as possible for a visit in 2 week(s).   Specialty:  Internal Medicine Contact information: [address] [place]  [id number] [phone]            Time spent on Discharge: 29 mins   Signed:   Joeann Steppe M.D. [facility] 09/10/2016, 10:54 AM Pager: [phone]   Addendum Patient was not able to be discharged on 09/10/16 evening   Deavin Forst M.D. [facility] 09/22/2016, 10:21 AM  Pager: [phone]

## 2016-09-10 NOTE — Progress Notes (Addendum)
[facility]  [facility] will be between 2pm and 5pm today.  AV visit scheduled at patient's home today at 7pm [facility].  Spoke with Eunice BlaseDebbie, [facility]RN to advise of same.   Thank you,  Adele BarthelAmy Evans, RN, BSN [facility] [phone]   All hospital liaison's are now on AMION.  Please call me at the above number with any questions, or call hospice at [phone] after 5 pm.

## 2016-09-11 MED ORDER — PANTOPRAZOLE SODIUM 40 MG PO TBEC
40.0000 mg | DELAYED_RELEASE_TABLET | Freq: Every day | ORAL | Status: DC
  Administered 2016-09-11: 40 mg via ORAL
  Filled 2016-09-11: qty 1

## 2016-09-11 MED ORDER — FENTANYL CITRATE (PF) 100 MCG/2ML IJ SOLN: 12.5000 ug | INTRAMUSCULAR | Status: DC | PRN

## 2016-09-11 MED ORDER — LACTULOSE 10 GM/15ML PO SOLN: 30.0000 g | Freq: Three times a day (TID) | ORAL | 0 refills | Status: AC

## 2016-09-11 MED ORDER — LORAZEPAM 1 MG PO TABS: 1.0000 mg | ORAL_TABLET | Freq: Three times a day (TID) | ORAL | 0 refills | Status: AC | PRN

## 2016-09-11 MED ORDER — GI COCKTAIL ~~LOC~~
30.0000 mL | Freq: Once | ORAL | Status: AC
  Administered 2016-09-11: 30 mL via ORAL
  Filled 2016-09-11: qty 30

## 2016-09-11 MED ORDER — FENTANYL 25 MCG/HR TD PT72: 25.0000 ug | MEDICATED_PATCH | TRANSDERMAL | 0 refills | Status: AC

## 2016-09-11 MED ORDER — OXYCODONE HCL 5 MG PO TABS: 5.0000 mg | ORAL_TABLET | ORAL | 0 refills | Status: AC | PRN

## 2016-09-11 MED ORDER — MIDODRINE HCL 10 MG PO TABS: 10.0000 mg | ORAL_TABLET | Freq: Three times a day (TID) | ORAL | 3 refills | Status: AC

## 2016-09-11 NOTE — Progress Notes (Addendum)
[facility]  Spoke with Eunice BlaseDebbie, [facility], she advised [facility] to pick up patient at 1100 a.m.  Patient has prescription from MD.   I have contacted referral center and advised of same.  Patient to be seen for AV at home around noon today.  Thank you,  Adele BarthelAmy Evans, RN, BSN [facility] [phone]  All hospital liaison's are now on AMION.  Please feel free to call me at the above number with any hospice related questions or concerns or call hospice at [phone] after 5pm.

## 2016-09-11 NOTE — Progress Notes (Signed)
Nsg Discharge Note  Admit Date:  09/01/2016 Discharge date: 09/11/2016   Joaquim NamJanet M Kaner to be D/C'd Home per MD order.  AVS completed.  Copy for chart, and copy for patient signed, and dated. Patient/caregiver able to verbalize understanding.  Discharge Medication: Allergies as of 09/11/2016      Reactions   Ibuprofen Other (See Comments)   Pt is unable to take due to her hernia.     Sulfa Antibiotics Nausea Only, Rash      Medication List    STOP taking these medications   furosemide 20 MG tablet Commonly known as:  LASIX   hydrOXYzine 10 MG tablet Commonly known as:  ATARAX/VISTARIL   spironolactone 100 MG tablet Commonly known as:  ALDACTONE   traZODone 100 MG tablet Commonly known as:  DESYREL     TAKE these medications   acetaminophen 325 MG tablet Commonly known as:  TYLENOL Take 650 mg by mouth every 6 (six) hours as needed for mild pain.   CALCIUM ALGINATE EX Apply 1 application topically See admin instructions. Apply to buttocks topically every day shift for wound care/ clean area with wound cleanser, apply calcium alginate and cover with dry dressing   DECUBI-VITE Caps Take 1 capsule by mouth daily.   diphenhydrAMINE 25 mg capsule Commonly known as:  BENADRYL Take 1 capsule (25 mg total) by mouth every 8 (eight) hours as needed for itching.   fentaNYL 25 MCG/HR patch Commonly known as:  DURAGESIC - dosed mcg/hr Place 1 patch (25 mcg total) onto the skin every 3 (three) days.   ferrous sulfate 325 (65 FE) MG tablet Take 325 mg by mouth daily with breakfast.   hydrocerin Crea Apply 1 application topically 2 (two) times daily. Apply to her skin, to maintain good moisture level   lactulose 10 GM/15ML solution Commonly known as:  CHRONULAC Take 45 mLs (30 g total) by mouth 3 (three) times daily. What changed:  when to take this   LORazepam 1 MG tablet Commonly known as:  ATIVAN Take 1 tablet (1 mg total) by mouth every 8 (eight) hours as needed for  anxiety.   midodrine 10 MG tablet Commonly known as:  PROAMATINE Take 1 tablet (10 mg total) by mouth 3 (three) times daily with meals.   NUTRITIONAL SUPPLEMENT PO Take 60 mLs by mouth 2 (two) times daily. Med pass - house 2.0   oxyCODONE 5 MG immediate release tablet Commonly known as:  Oxy IR/ROXICODONE Take 1 tablet (5 mg total) by mouth every 3 (three) hours as needed for moderate pain or severe pain. related to Alcoholic Cirrhosis of Liver with Ascites   pantoprazole 40 MG tablet Commonly known as:  PROTONIX Take 1 tablet (40 mg total) by mouth daily. What changed:  when to take this   PROTEIN PO Take 30 mLs by mouth 2 (two) times daily.   thiamine 100 MG tablet Take 1 tablet (100 mg total) by mouth daily.       Discharge Assessment: Vitals:   09/10/16 2124 09/11/16 0524  BP: (!) 102/48 (!) 103/44  Pulse: 98 (!) 113  Resp: 18 18  Temp: 98.2 F (36.8 C) 98.7 F (37.1 C)   Skin clean, dry and intact without evidence of skin break down, no evidence of skin tears noted. IV catheter discontinued intact. Site without signs and symptoms of complications - no redness or edema noted at insertion site, patient denies c/o pain - only slight tenderness at site.  Dressing with slight  pressure applied.  D/c Instructions-Education: Discharge instructions given to patient/family with verbalized understanding. D/c education completed with patient/family including follow up instructions, medication list, d/c activities limitations if indicated, with other d/c instructions as indicated by MD - patient able to verbalize understanding, all questions fully answered. Patient instructed to return to ED, call 911, or call MD for any changes in condition.  Patient escorted via WC, and D/C home via private auto.  Sura Canul, Gretta Cool, RN 09/11/2016 12:04 PM  Nsg Discharge Note  Admit Date:  09/01/2016 Discharge date: 09/11/2016   KELSEY DURFLINGER to be D/C'd Home per MD order.  AVS  completed.  Copy for chart, and copy for patient signed, and dated. Patient/caregiver able to verbalize understanding.  Discharge Medication: Allergies as of 09/11/2016      Reactions   Ibuprofen Other (See Comments)   Pt is unable to take due to her hernia.     Sulfa Antibiotics Nausea Only, Rash      Medication List    STOP taking these medications   furosemide 20 MG tablet Commonly known as:  LASIX   hydrOXYzine 10 MG tablet Commonly known as:  ATARAX/VISTARIL   spironolactone 100 MG tablet Commonly known as:  ALDACTONE   traZODone 100 MG tablet Commonly known as:  DESYREL     TAKE these medications   acetaminophen 325 MG tablet Commonly known as:  TYLENOL Take 650 mg by mouth every 6 (six) hours as needed for mild pain.   CALCIUM ALGINATE EX Apply 1 application topically See admin instructions. Apply to buttocks topically every day shift for wound care/ clean area with wound cleanser, apply calcium alginate and cover with dry dressing   DECUBI-VITE Caps Take 1 capsule by mouth daily.   diphenhydrAMINE 25 mg capsule Commonly known as:  BENADRYL Take 1 capsule (25 mg total) by mouth every 8 (eight) hours as needed for itching.   fentaNYL 25 MCG/HR patch Commonly known as:  DURAGESIC - dosed mcg/hr Place 1 patch (25 mcg total) onto the skin every 3 (three) days.   ferrous sulfate 325 (65 FE) MG tablet Take 325 mg by mouth daily with breakfast.   hydrocerin Crea Apply 1 application topically 2 (two) times daily. Apply to her skin, to maintain good moisture level   lactulose 10 GM/15ML solution Commonly known as:  CHRONULAC Take 45 mLs (30 g total) by mouth 3 (three) times daily. What changed:  when to take this   LORazepam 1 MG tablet Commonly known as:  ATIVAN Take 1 tablet (1 mg total) by mouth every 8 (eight) hours as needed for anxiety.   midodrine 10 MG tablet Commonly known as:  PROAMATINE Take 1 tablet (10 mg total) by mouth 3 (three) times daily  with meals.   NUTRITIONAL SUPPLEMENT PO Take 60 mLs by mouth 2 (two) times daily. Med pass - house 2.0   oxyCODONE 5 MG immediate release tablet Commonly known as:  Oxy IR/ROXICODONE Take 1 tablet (5 mg total) by mouth every 3 (three) hours as needed for moderate pain or severe pain. related to Alcoholic Cirrhosis of Liver with Ascites   pantoprazole 40 MG tablet Commonly known as:  PROTONIX Take 1 tablet (40 mg total) by mouth daily. What changed:  when to take this   PROTEIN PO Take 30 mLs by mouth 2 (two) times daily.   thiamine 100 MG tablet Take 1 tablet (100 mg total) by mouth daily.       Discharge Assessment: Vitals:  09/10/16 2124 09/11/16 0524  BP: (!) 102/48 (!) 103/44  Pulse: 98 (!) 113  Resp: 18 18  Temp: 98.2 F (36.8 C) 98.7 F (37.1 C)   Skin clean, dry and intact without evidence of skin break down, no evidence of skin tears noted. IV catheter discontinued intact. Site without signs and symptoms of complications - no redness or edema noted at insertion site, patient denies c/o pain - only slight tenderness at site.  Dressing with slight pressure applied.  D/c Instructions-Education: Discharge instructions given to patient/family with verbalized understanding. D/c education completed with patient/family including follow up instructions, medication list, d/c activities limitations if indicated, with other d/c instructions as indicated by MD - patient able to verbalize understanding, all questions fully answered. Patient instructed to return to ED, call 911, or call MD for any changes in condition.  Patient escorted via WC, and D/C home via private auto.  Danaysha Kirn Consuella Lose, RN 09/11/2016 12:04 PM

## 2016-09-11 NOTE — Progress Notes (Signed)
[facility]  Spoke with Debbie,CMRN, to find out why patient was not discharged last night.  Patient was to go home by PT[facility] last night, but prescriptions were not provided to patient and the patient opted to stay at hospital rather than go home without medications.  AV was set up for last night.  After speaking with Eunice BlaseDebbie, we will try to get the patient discharged as soon as possible this morning to try and get AV scheduled by noon.  Debbie to follow up with MD to get scripts for patient.  Thank you,   Adele BarthelAmy Evans, RN, BSN [facility] [phone]  All hospital liaison's are now on AMION.  Please call me at the above number with any hospice related questions or concerns or call hospice at [phone] after 5pm.

## 2016-09-11 NOTE — Progress Notes (Addendum)
Spoke with Amy, [facility] clinical liaison for clarification of delay in discharge. Patient scheduled to be discharged to home via [facility] with admission into hospice at 7:00pm at home. Amy states that palliative RN requested discharge prescriptions (midrodine, Fentanyl patch, and po oxy) from discharging RN and MD and these would not be provided. Therefore admission process into [facility] could not be completed since patient would not have DC meds available. CM will clarify with MD, get Rx's and send with patient today.  10:20 Rx with [facility] paperwork on front of chart, [facility] to pick up at 11:00. Updated [facility], they will be at home around 12:00. Bedside RN made aware of DC plans.

## 2016-09-11 NOTE — Progress Notes (Signed)
[facility]  Per Pattricia BossAnnie, [place].  On track to be home by noon.  Thank you,  Adele BarthelAmy Evans, RN, BSN [facility] [phone]  All hospice liaison's now on AMION.  Please feel free to contact me at the above number or call hospice at [phone] after 5pm.

## 2016-09-11 NOTE — Discharge Summary (Addendum)
Physician Discharge Summary   Patient ID: Christine NamJanet M [name] MRN: [id number] DOB/AGE: 09/14/1960 56 y.o.  Admit date: 09/01/2016 Discharge date: 09/11/2016  Primary Care Physician:  Lonia BloodGARBA,LAWAL, MD  Discharge Diagnoses:    . Hepatic encephalopathy (HCC) . Shock (HCC)Due to hypovolemia . Alcoholic cirrhosis of liver with ascites (HCC) . Anemia of chronic disease . Decompensated end-stage liver disease . Hyperkalemia   Hyponatremia . 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 Home hospice arranged 2. The patient will need therapeutic paracentesis for comfort, last done on 2/6 3. Trazodone and vistaril have been discontinued   DIET: *Heart healthy diet    Allergies:   Allergies  Allergen Reactions  . Ibuprofen Other (See Comments)    Pt is unable to take due to her hernia.    . Sulfa Antibiotics Nausea Only and Rash     DISCHARGE MEDICATIONS: Current Discharge Medication List    START taking these medications   Details  LORazepam (ATIVAN) 1 MG tablet Take 1 tablet (1 mg total) by mouth every 8 (eight) hours as needed for anxiety. Qty: 10 tablet, Refills: 0    midodrine (PROAMATINE) 10 MG tablet Take 1 tablet (10 mg total) by mouth 3 (three) times daily with meals. Qty: 90 tablet, Refills: 3      CONTINUE these medications which have CHANGED   Details  fentaNYL (DURAGESIC - DOSED MCG/HR) 25 MCG/HR patch Place 1 patch (25 mcg total) onto the skin every 3 (three) days. Qty: 4 patch, Refills: 0    lactulose (CHRONULAC) 10 GM/15ML solution Take 45 mLs (30 g total) by mouth 3 (three) times daily. Qty: 240 mL, Refills: 0    oxyCODONE (OXY IR/ROXICODONE) 5 MG immediate release tablet Take 1 tablet (5 mg total) by mouth every 3 (three) hours as needed for moderate pain or severe pain.  related to Alcoholic Cirrhosis of Liver with Ascites Qty: 10 tablet, Refills: 0      CONTINUE these medications which have NOT CHANGED   Details  acetaminophen (TYLENOL) 325 MG tablet Take 650 mg by mouth every 6 (six) hours as needed for mild pain.    CALCIUM ALGINATE EX Apply 1 application topically See admin instructions. Apply to buttocks topically every day shift for wound care/ clean area with wound cleanser, apply calcium alginate and cover with dry dressing    diphenhydrAMINE (BENADRYL) 25 mg capsule Take 1 capsule (25 mg total) by mouth every 8 (eight) hours as needed for itching.    ferrous sulfate 325 (65 FE) MG tablet Take 325 mg by mouth daily with breakfast.    hydrocerin (EUCERIN) CREA Apply 1 application topically 2 (two) times daily. Apply to her skin, to maintain good moisture level    Multiple Vitamins-Minerals (DECUBI-VITE) CAPS Take 1 capsule by mouth daily.    Nutritional Supplements (NUTRITIONAL SUPPLEMENT PO) Take 60 mLs by mouth 2 (two) times daily. Med pass - house 2.0    pantoprazole (PROTONIX) 40 MG tablet Take 1 tablet (40 mg total) by mouth daily.    PROTEIN PO Take 30 mLs by mouth 2 (two) times daily.    thiamine 100 MG tablet Take 1 tablet (100 mg total) by mouth daily.      STOP taking these medications  furosemide (LASIX) 20 MG tablet      hydrOXYzine (ATARAX/VISTARIL) 10 MG tablet      spironolactone (ALDACTONE) 100 MG tablet      traZODone (DESYREL) 100 MG tablet          Brief H and P: For complete details please refer to admission H and P, but in brief 56 y.o.femalewith a history of decompensated alcoholic cirrhosis, Hepatic encephalopathy, ascites who presented with altered mental status and lethargy, and was found to be in shock   Hospital Course:  Shock - Hypotension possibly due to hypovolemia - In the setting of decompensated alcoholic cirrhosis, hepatic encephalopathy and ascites, end-stage liver disease. Possibly due to  hypovolemia. - Status post albumin  - Discontinuedantibiotics with ascites fluid not convincing for SBP - baseline low BP, systolic BP appears to be ~100 per prior admits - increased midodrine to max dose and follow   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 - continue high dose lactulose  - Patient is alert and awake, however overall prognosis poor, no significant improvement in the overall status   Ascites -Ultrasound-guided therapy to take paracentesis done on 2/6 yielding 7.6 L, albumin 25 g also given.    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 - Medications for pain or shortness of breath not to be held due to concerns for hypotension if needed to ensure her comfort. - Please note patient is now given the prescriptions for oxycodone (#10, no refills), fentanyl patch (#4 patches, no refills), both were prior to admission medications - Patient is also given prescription for Ativan as needed for anxiety (# 10, no refills)   Hyponatremia - in the setting of fluid overload/liver disease, sodium 129 at the time of admission, now improved. -Sodium level stable, cannot diurese further due to hypotension  Hyperkalemia - Patient presented with potassium of 6.5, spironolactone discontinued.  - Improved with increased stool output. Potassium 3.8 at the time of discharge.  Acute kidney injury - Patient presented with creatinine of 2.2 at the time of admission, Renal function improved  - avoid nephrotoxic meds . Creatinine 1.0 at the time of discharge.  Anemia - of chronic liver disease - Follow H&H, hemoglobin 9.3.  GERD - Continue  PPI   Day of Discharge  Patient is alert and awake, states she did not sleep well, wanted ginger ale and ice. Otherwise no other complaints. Wants to go home today.  BP (!) 103/44 (BP Location: Left Arm)   Pulse (!) 113   Temp 98.7 F (37.1 C) (Oral)   Resp 18   Ht 5\' 4"  (1.626 m)   Wt 69.3 kg (152 lb 12.5 oz)   SpO2 100%   BMI 26.22 kg/m   Physical Exam: General: Alert and awake oriented x3 not in any acute distress, frail. HEENT: anicteric sclera, pupils reactive to light and accommodation, scleral icterus CVS: S1-S2 clear no murmur rubs or gallops Chest: clear to auscultation bilaterally, no wheezing rales or rhonchi Abdomen: soft nontender,+ distended, normal bowel sounds Extremities: no cyanosis, clubbing, + edema noted bilaterally    The results of significant diagnostics from this hospitalization (including imaging, microbiology, ancillary and laboratory) are listed below for reference.  LAB RESULTS: Basic Metabolic Panel:  Recent Labs Lab 09/09/16 0539 09/10/16 0546  NA 129* 132*  K 3.5 3.8  CL 105 108  CO2 12* 14*  GLUCOSE 91 88  BUN 53* 45*  CREATININE 1.08* 1.00  CALCIUM 7.8* 7.8*   Liver Function Tests:  Recent Labs Lab 09/09/16 0539 09/10/16 0546  AST 43* 32  ALT 24 18  ALKPHOS 100 84  BILITOT 1.4* 1.0  PROT 5.7* 5.2*  ALBUMIN 1.7* 1.8*   No results for input(s): LIPASE, AMYLASE in the last 168 hours.  Recent Labs Lab 09/06/16 0621 09/09/16 0539  AMMONIA 27 29   CBC:  Recent Labs Lab 09/05/16 0307 09/09/16 0539  WBC 15.3* 15.9*  HGB 10.2* 9.3*  HCT 30.7* 27.0*  MCV 87.5 83.6  PLT 249 220   Cardiac Enzymes: No results for input(s): CKTOTAL, CKMB, CKMBINDEX, TROPONINI in the last 168 hours. BNP: Invalid input(s): POCBNP CBG: No results for input(s): GLUCAP in the last 168 hours.  Significant Diagnostic Studies:  US Paracentesis  Result Date: 09/02/2016 INDICATION: Decompensated alcoholic cirrhosis. Altered mental  status. Hypotension. Acute kidney injury. Recurrent ascites. Request diagnostic and therapeutic paracentesis. EXAM: ULTRASOUND GUIDED RIGHT LOWER QUADRANT PARACENTESIS MEDICATIONS: None. COMPLICATIONS: None immediate. PROCEDURE: Informed written consent was obtained from the patient after a discussion of the risks, benefits and alternatives to treatment. A timeout was performed prior to the initiation of the procedure. Initial ultrasound scanning demonstrates a large amount of ascites within the right lower abdominal quadrant. The right lower abdomen was prepped and draped in the usual sterile fashion. 1% lidocaine with epinephrine was used for local anesthesia. Following this, a 19 gauge, 7-cm, Yueh catheter was introduced. An ultrasound image was saved for documentation purposes. The paracentesis was performed. The catheter was removed and a dressing was applied. The patient tolerated the procedure well without immediate post procedural complication. FINDINGS: A total of approximately 2.5 L of clear yellow fluid was removed. Procedure was terminated at this time due to hypotension. Samples were sent to the laboratory as requested by the clinical team. IMPRESSION: Successful ultrasound-guided paracentesis yielding 2.5 liters of peritoneal fluid. Read by: Brayton El PA-C Electronically Signed   By: Simonne Come M.D.   On: 09/02/2016 11:55   Dg Chest Portable 1 View  Result Date: 09/01/2016 CLINICAL DATA:  Altered mental status. Abdominal distention. Bradypnea. EXAM: PORTABLE CHEST 1 VIEW COMPARISON:  Chest radiographs 07/21/2016 FINDINGS: Low lung volumes. Patient's hand partially obscures evaluation of the left lung base. Improved left basilar aeration with probable residual small volume pleural fluid. Streaky bibasilar opacities likely atelectasis. Normal heart size. Unchanged mediastinal contours. No pneumothorax. No acute osseous abnormality. IMPRESSION: Low lung volumes with small left pleural effusion,  decreased in size from radiographs 6 weeks prior. Streaky bibasilar atelectasis. Electronically Signed   By: Rubye Oaks M.D.   On: 09/01/2016 21:20       Disposition and Follow-up: Discharge Instructions    Diet - low sodium heart healthy    Complete by:  As directed    Increase activity slowly    Complete by:  As directed        DISPOSITION: home with hospice    DISCHARGE FOLLOW-UP Follow-up Information    GARBA,LAWAL, MD. Schedule an appointment as soon as possible for a visit on 09/24/2016.   Specialty:  Internal Medicine Why:  Appointment is on 09/24/16 at 11am Contact information: [address] [place] [address] [phone]  Time spent on Discharge: 20 mins   Signed:   RAI,RIPUDEEP M.D. [facility] 09/11/2016, 9:23 AM Pager: [phone]  Coding queries:  Per  Nursing documentation  Pressure injury ,  RT leg ,  Stage II    Right heel  6cm x 4cm with blood and serum filled blister noted.   RT buttocks  stage II   RAI,RIPUDEEP M.D. [facility] 09/13/2016, 3:16 PM  Pager: [phone]  Coding query  Per wound care nurse,  right heel wound was not present on admission. Please note patient was admitted by Dr. Maryfrances Bunnell on 09/01/16, please refer to admission note/ H&P by Dr. Maryfrances Bunnell for details.    RAI,RIPUDEEP M.D. [facility] 09/19/2016, 11:23 AM  Pager: [phone]

## 2016-09-16 DIAGNOSIS — N179 Acute kidney failure, unspecified: Secondary | ICD-10-CM | POA: Insufficient documentation

## 2016-09-16 DIAGNOSIS — N189 Chronic kidney disease, unspecified: Secondary | ICD-10-CM

## 2016-09-22 NOTE — Plan of Care (Signed)
Patient was not able to be discharged on 09/10/16 evening.   RAI,RIPUDEEP M.D. [facility] 09/22/2016, 10:19 AM  Pager: [phone]

## 2016-10-02 DEATH — deceased

## 2016-11-06 DIAGNOSIS — M549 Dorsalgia, unspecified: Secondary | ICD-10-CM

## 2016-11-06 DIAGNOSIS — K219 Gastro-esophageal reflux disease without esophagitis: Secondary | ICD-10-CM | POA: Insufficient documentation

## 2016-11-06 DIAGNOSIS — F324 Major depressive disorder, single episode, in partial remission: Secondary | ICD-10-CM | POA: Insufficient documentation

## 2016-11-06 DIAGNOSIS — G8929 Other chronic pain: Secondary | ICD-10-CM | POA: Insufficient documentation

## 2016-11-06 DIAGNOSIS — R791 Abnormal coagulation profile: Secondary | ICD-10-CM | POA: Insufficient documentation

## 2016-11-06 DIAGNOSIS — F411 Generalized anxiety disorder: Secondary | ICD-10-CM | POA: Insufficient documentation

## 2016-12-05 IMAGING — US US PARACENTESIS
1 series · 7 of 7 positions shown · non-contrast
Comparison: none

INDICATION: Abdominal distention secondary to ascites. History of cirrhosis.
Request diagnostic and therapeutic paracentesis.

[Series 1: us paracentesis · 0.26mm/px · 7 of 7 slices shown]
[im 1/7]
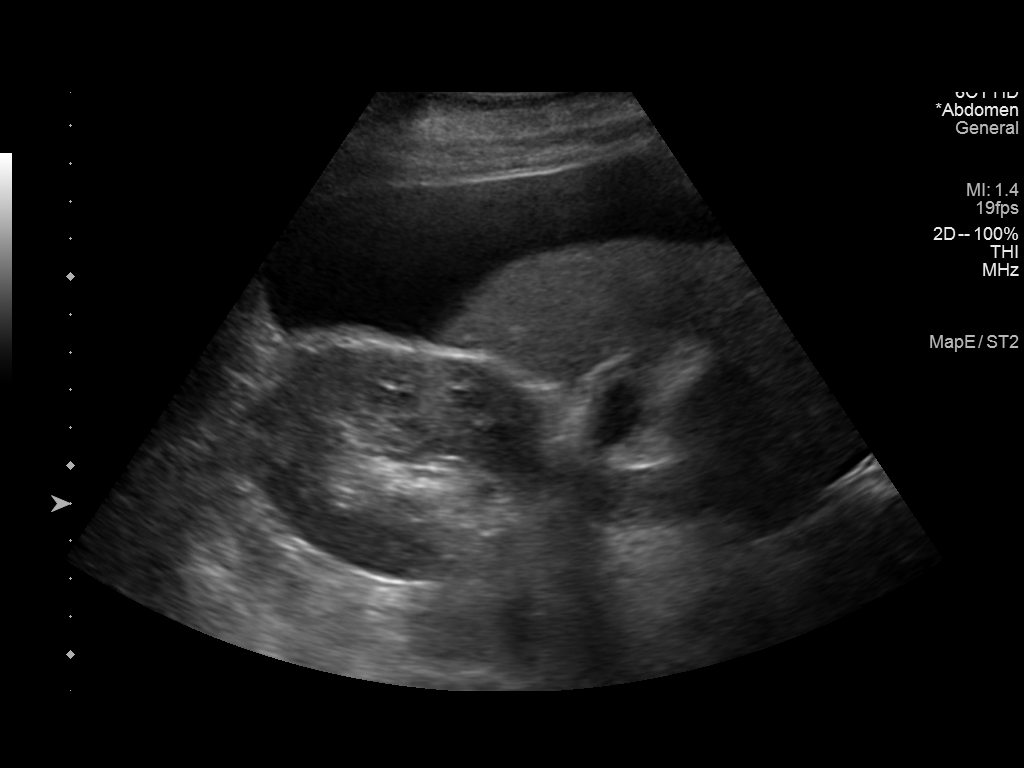
[im 2/7]
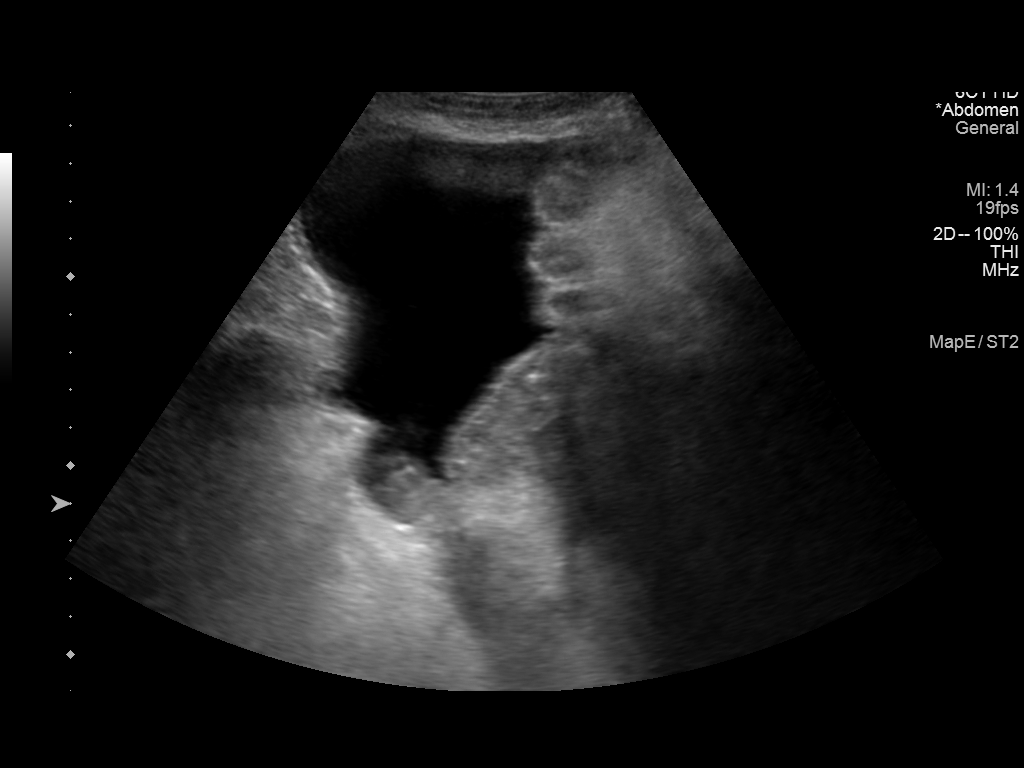
[im 3/7]
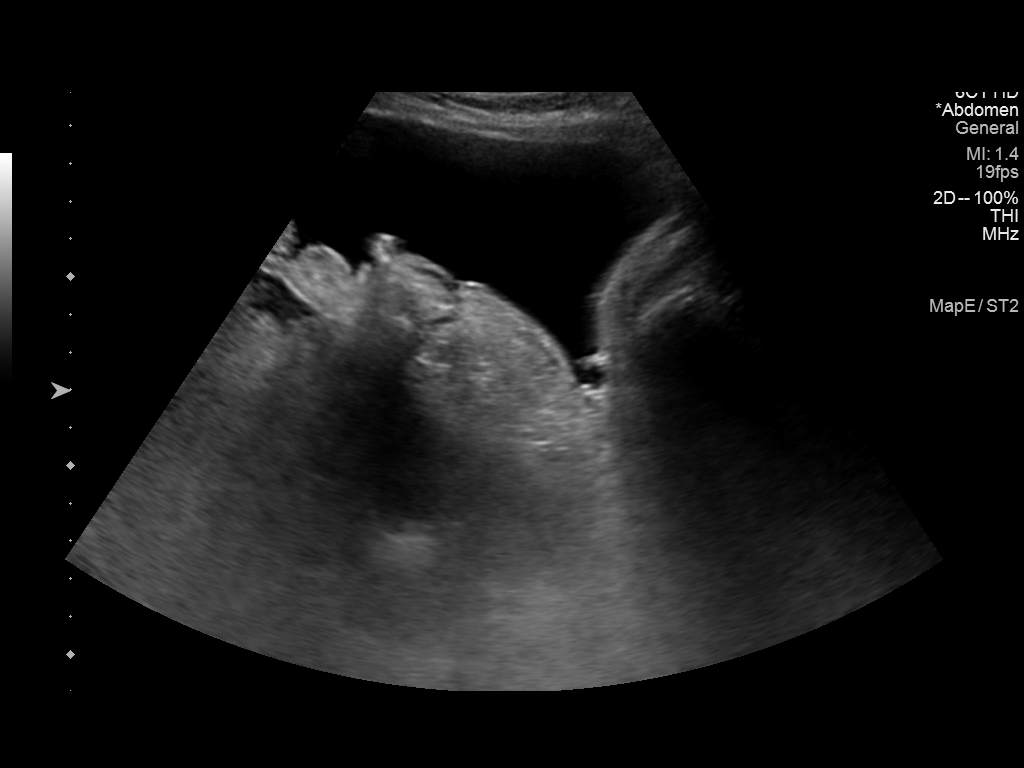
[im 4/7]
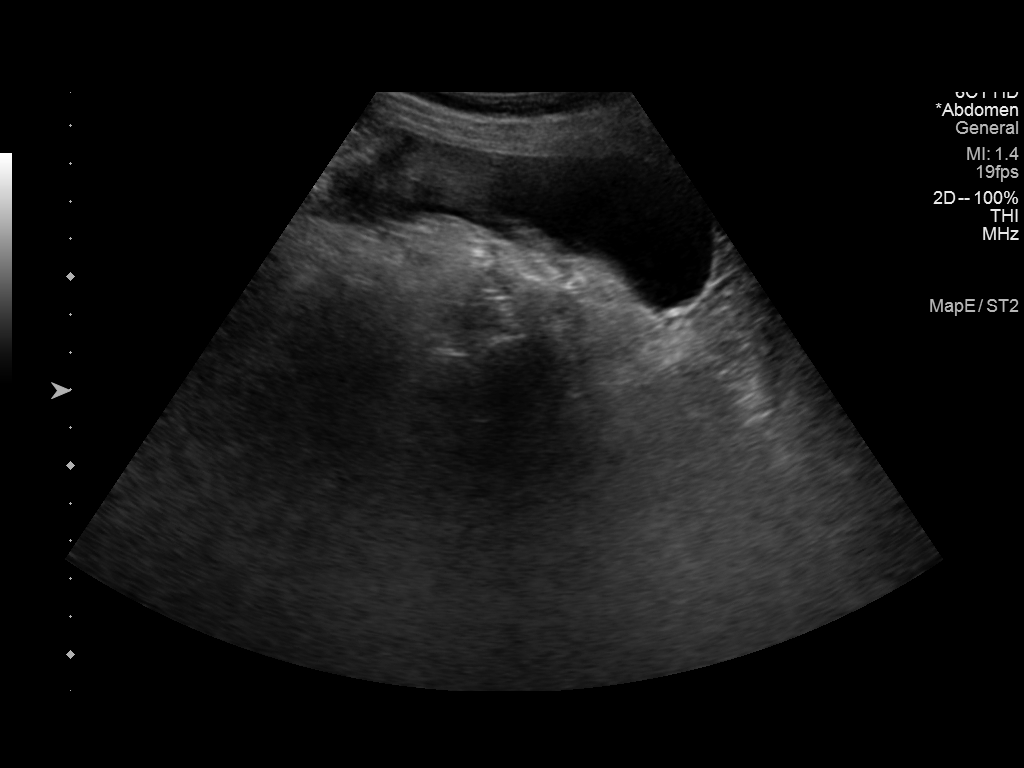
[im 5/7]
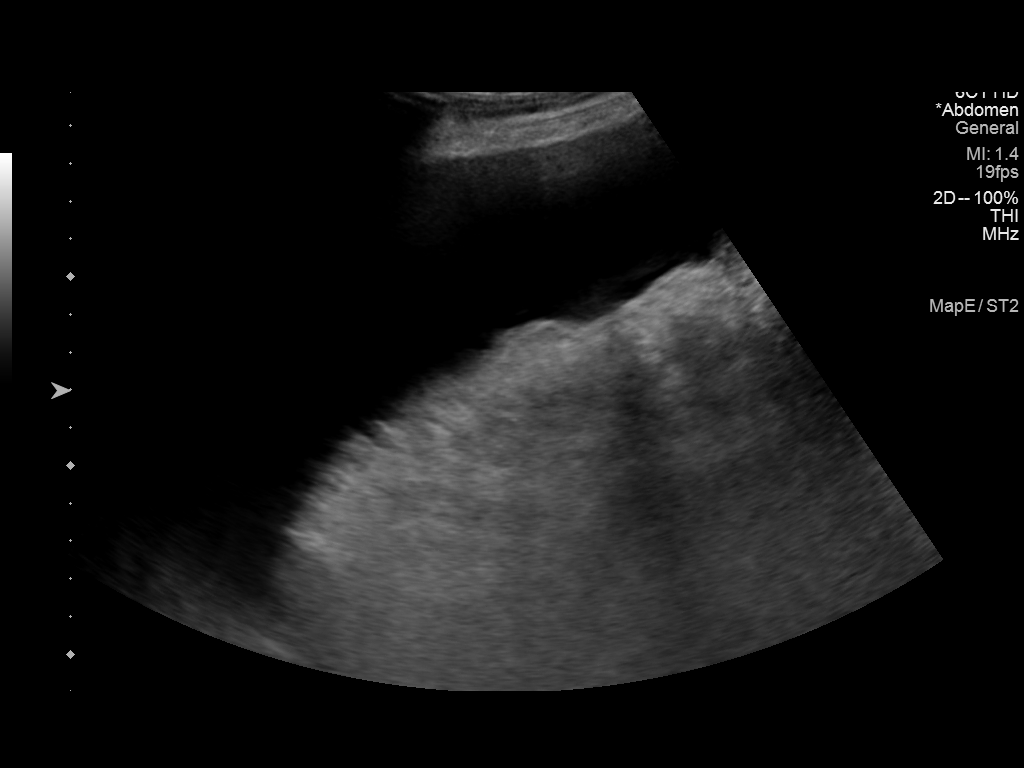
[im 6/7]
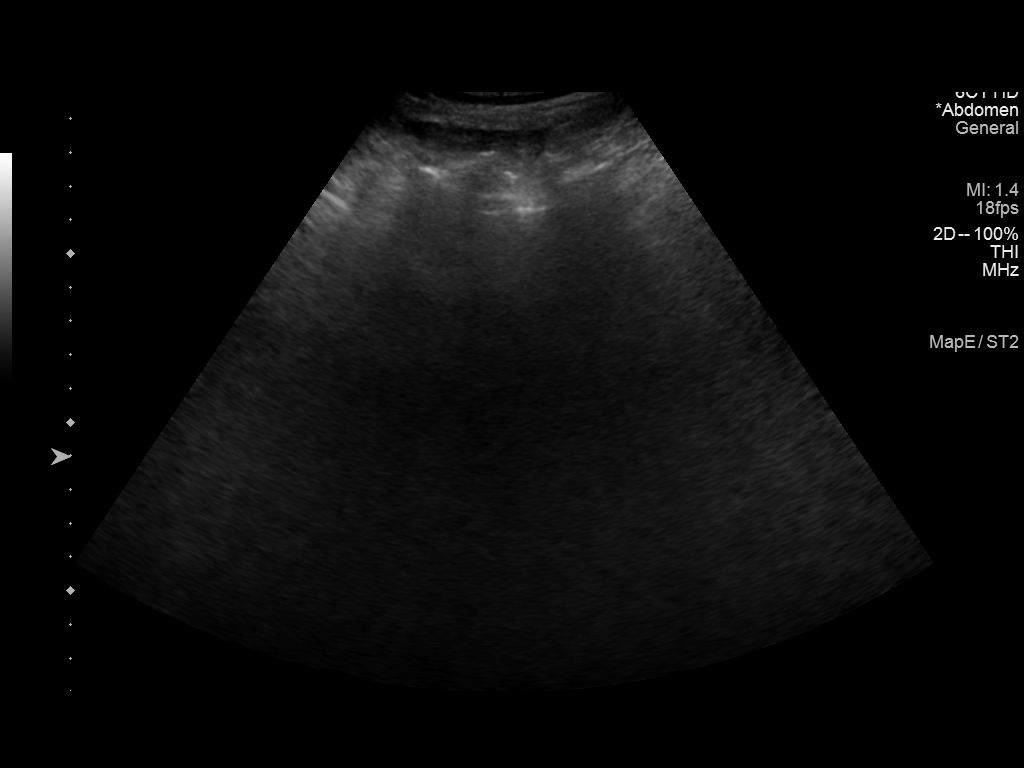
[im 7/7]
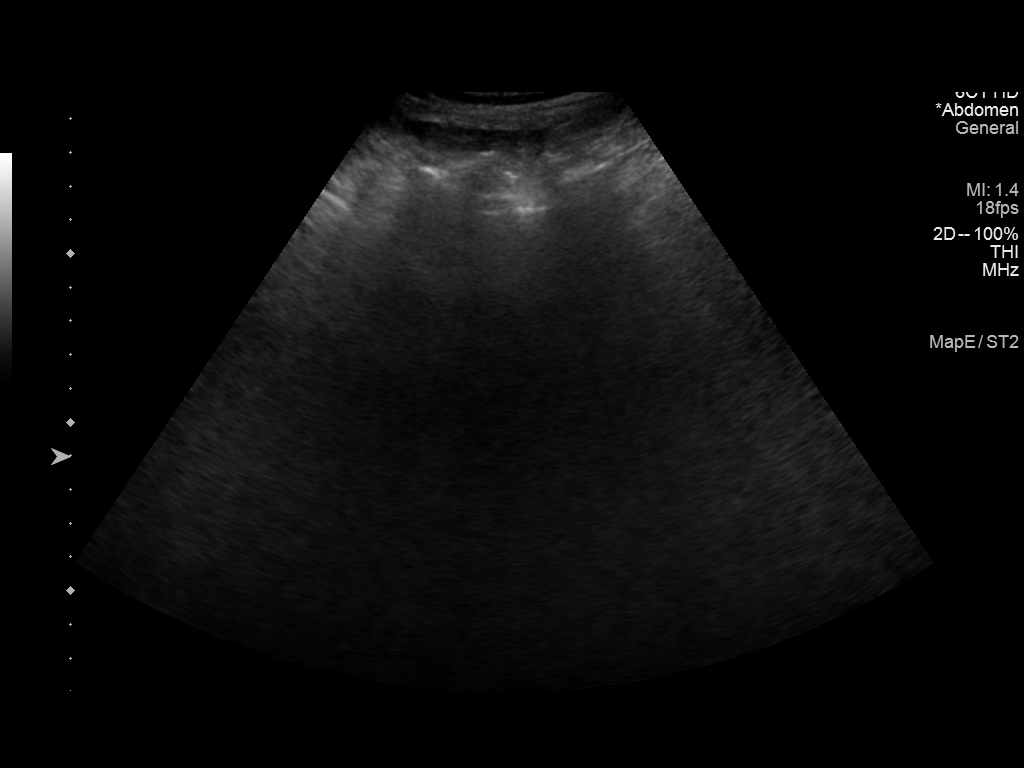

[7 of 7 positions shown; findings below may reference images not displayed]

EXAM:
ULTRASOUND GUIDED LEFT LOWER QUADRANT PARACENTESIS

MEDICATIONS:
None.

COMPLICATIONS:
None immediate.

PROCEDURE:
Informed written consent was obtained from the patient after a
discussion of the risks, benefits and alternatives to treatment. A
timeout was performed prior to the initiation of the procedure.

Initial ultrasound scanning demonstrates a large amount of ascites
within the right lower abdominal quadrant. The left lower abdomen
was prepped and draped in the usual sterile fashion. 1% lidocaine
with epinephrine was used for local anesthesia.

Following this, a Safe-T-Centesis catheter was introduced. An
ultrasound image was saved for documentation purposes. The
paracentesis was performed. The catheter was removed and a dressing
was applied. The patient tolerated the procedure well without
immediate post procedural complication.
FINDINGS: A total of approximately 3.3 L of clear yellow fluid was removed.
Samples were sent to the laboratory as requested by the clinical
team.
IMPRESSION: Successful ultrasound-guided paracentesis yielding 3.3 liters of
peritoneal fluid.

## 2017-02-25 IMAGING — US US PARACENTESIS
1 series · 10 of 10 positions shown · non-contrast
Comparison: none

INDICATION: Alcoholic cirrhosis, recurrent ascites. Request made for therapeutic
paracentesis.

[Series 1: us paracentesis · 0.28mm/px · 10 of 10 slices shown]
[im 1/10]
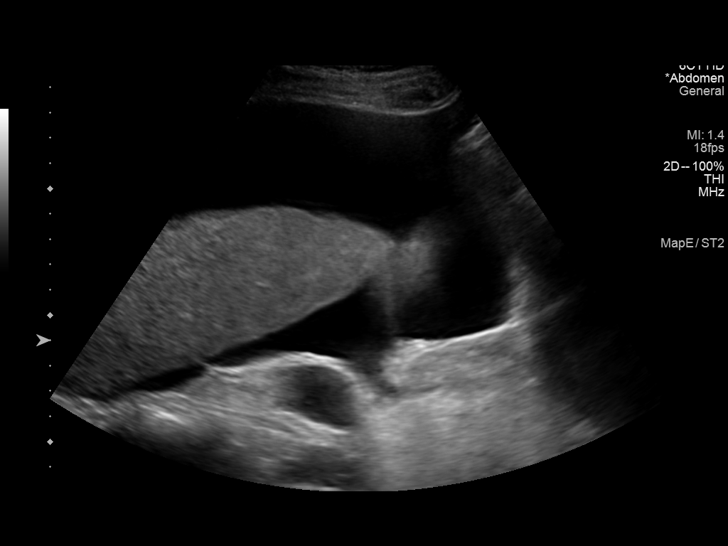
[im 2/10]
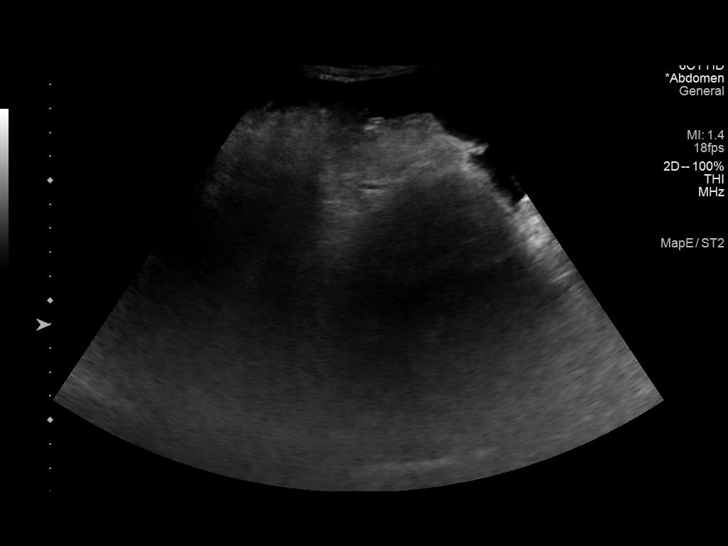
[im 3/10]
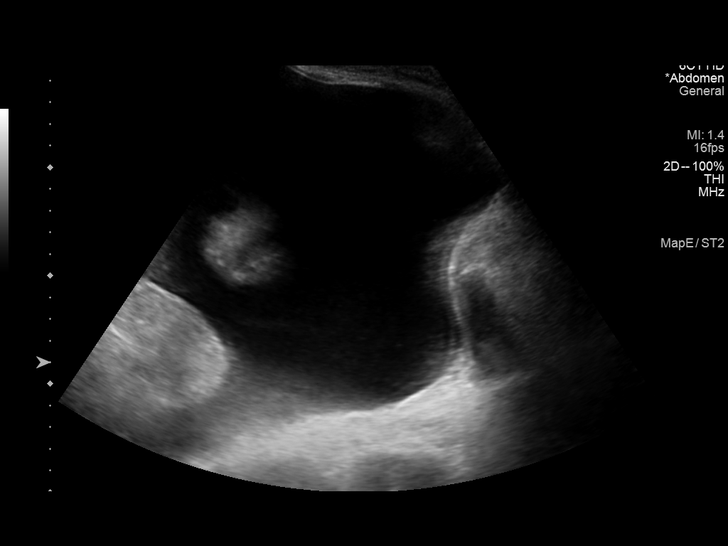
[im 4/10]
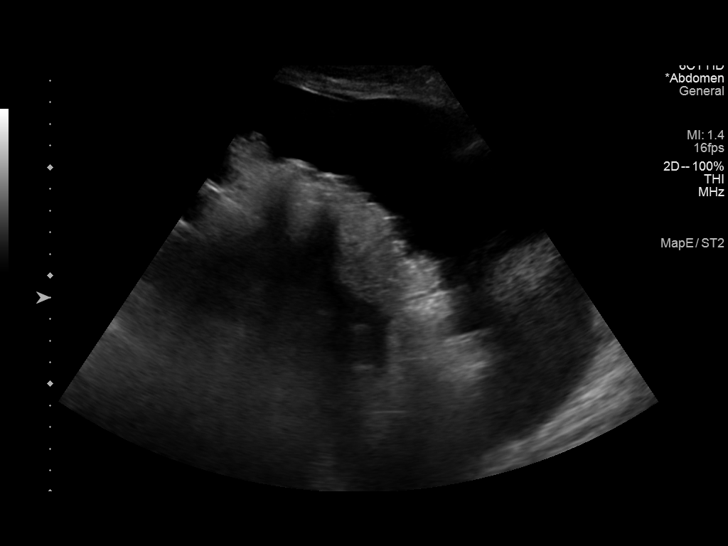
[im 5/10]
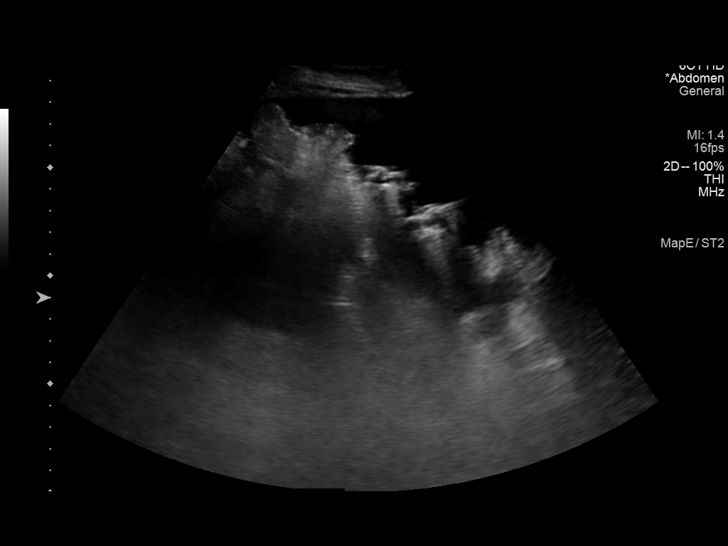
[im 6/10]
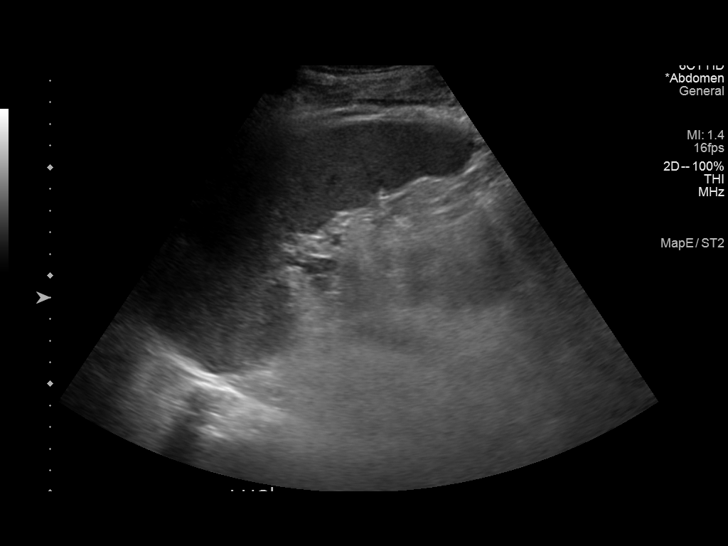
[im 7/10]
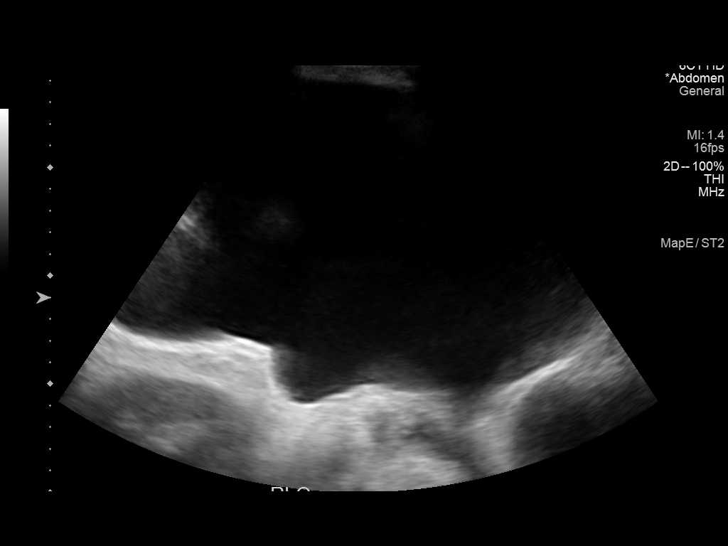
[im 8/10]
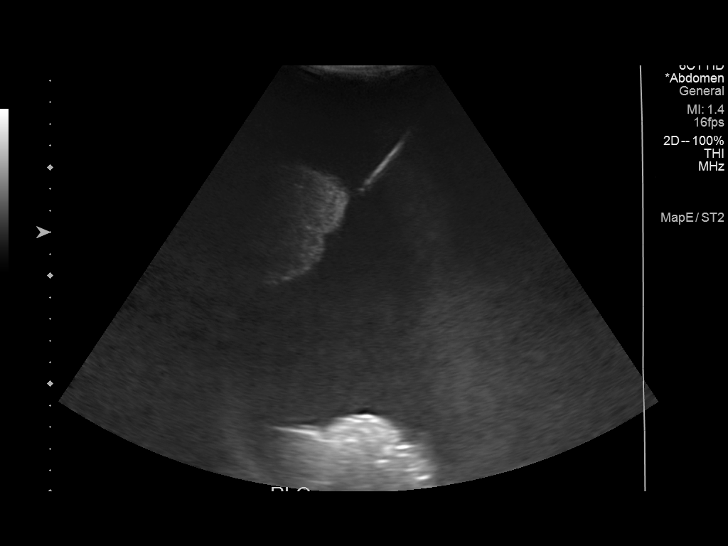
[im 9/10]
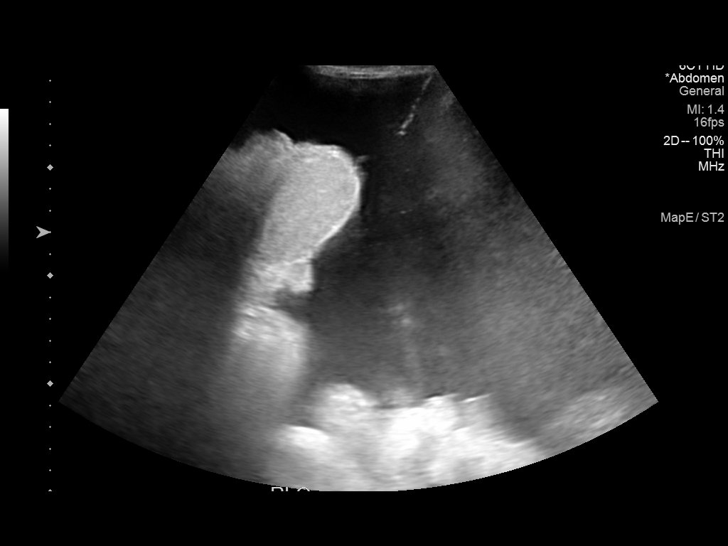
[im 10/10]
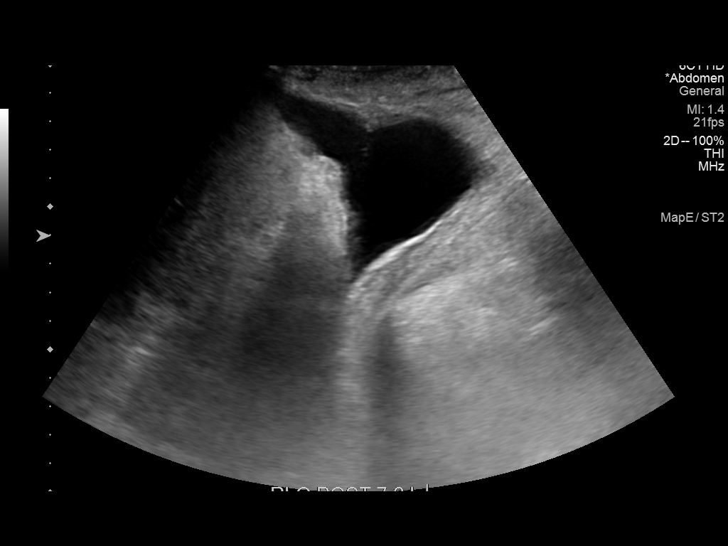

[10 of 10 positions shown; findings below may reference images not displayed]

EXAM:
ULTRASOUND GUIDED THERAPEUTIC PARACENTESIS

MEDICATIONS:
None.

COMPLICATIONS:
None immediate.

PROCEDURE:
Informed written consent was obtained from the patient after a
discussion of the risks, benefits and alternatives to treatment. A
timeout was performed prior to the initiation of the procedure.

Initial ultrasound scanning demonstrates a large amount of ascites
within the right lower abdominal quadrant. The right lower abdomen
was prepped and draped in the usual sterile fashion. 1% lidocaine
was used for local anesthesia.

Following this, a Yueh catheter was introduced. An ultrasound image
was saved for documentation purposes. The paracentesis was
performed. The catheter was removed and a dressing was applied. The
patient tolerated the procedure well without immediate post
procedural complication.
FINDINGS: A total of approximately 7.2 liters of yellow fluid was removed.
IMPRESSION: Successful ultrasound-guided therapeutic paracentesis yielding
liters of peritoneal fluid. Only the above amount of fluid was
removed today secondary to pt hypotension.

## 2017-03-16 IMAGING — CR DG CHEST 1V PORT
1 series · 1 of 1 positions shown · non-contrast
Comparison: Chest radiographs 07/21/2016

CLINICAL DATA: Altered mental status. Abdominal distention.
Bradypnea.

EXAM:
PORTABLE CHEST 1 VIEW

[AP]
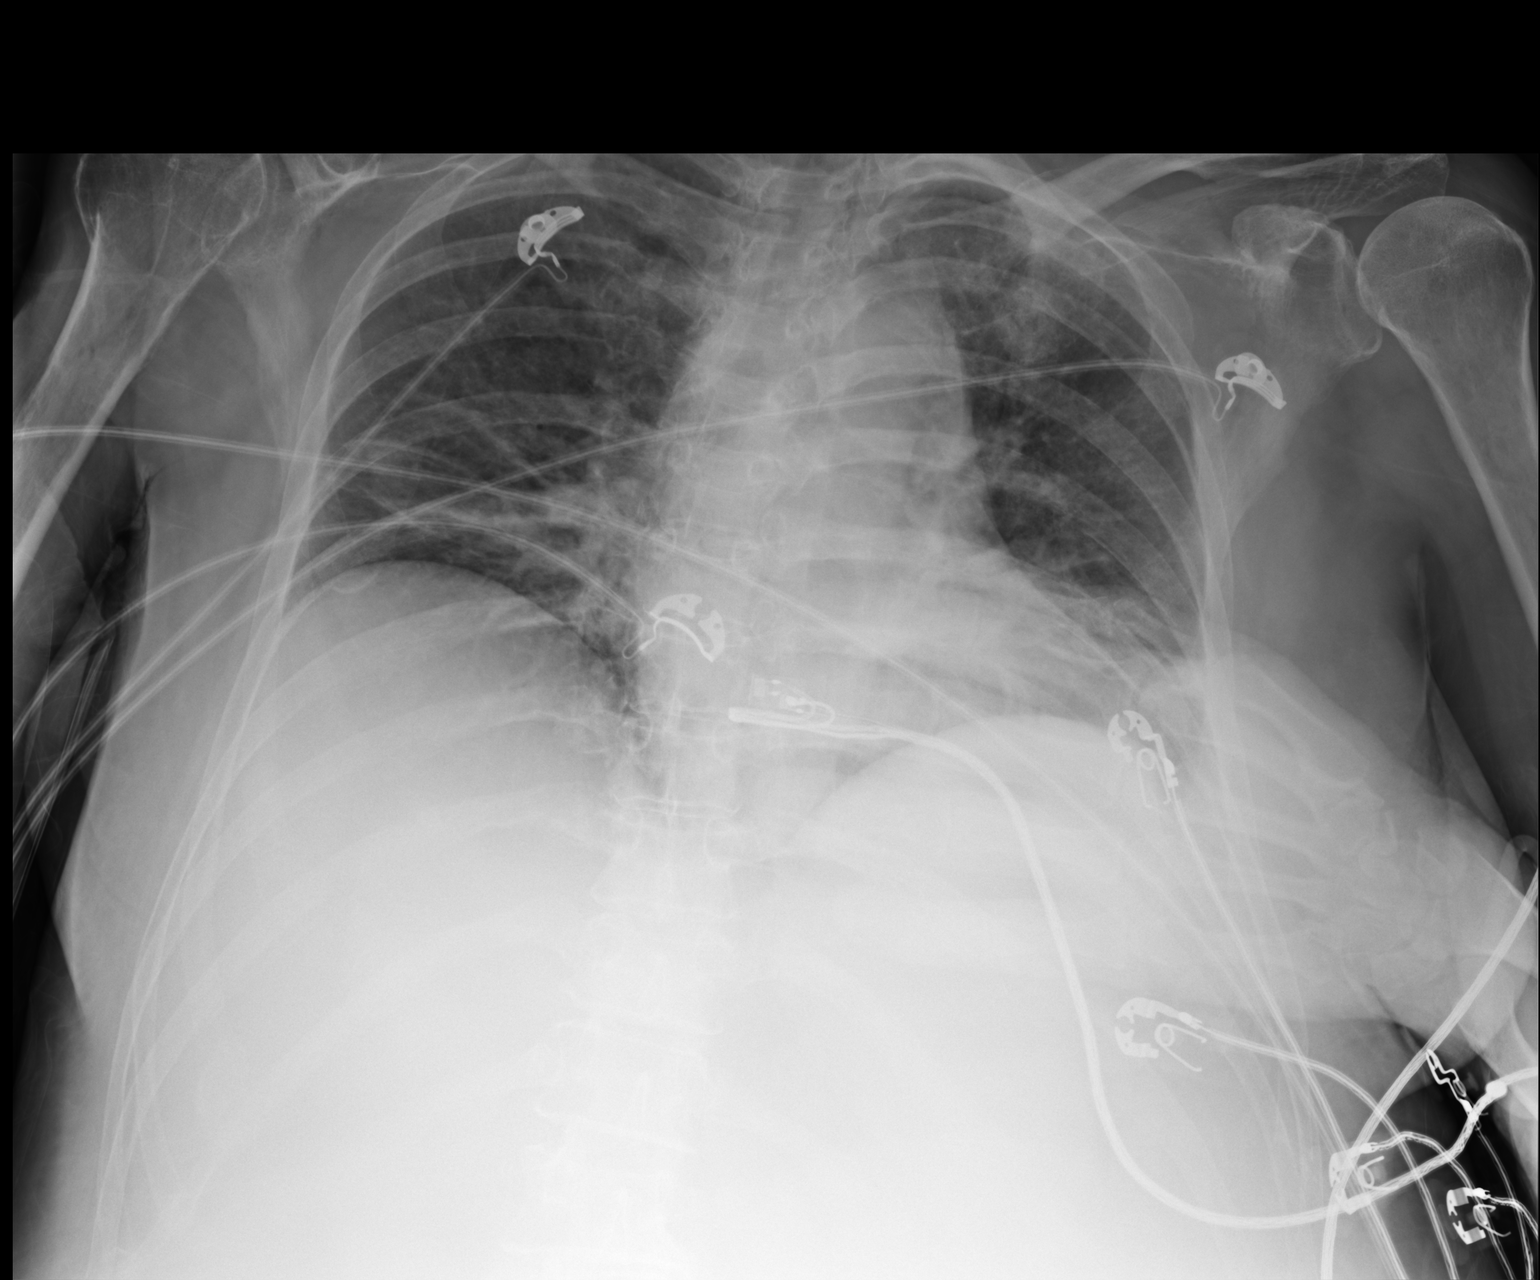

[1 of 1 positions shown; findings below may reference images not displayed]

FINDINGS: Low lung volumes. Patient's hand partially obscures evaluation of
the left lung base. Improved left basilar aeration with probable
residual small volume pleural fluid. Streaky bibasilar opacities
likely atelectasis. Normal heart size. Unchanged mediastinal
contours. No pneumothorax. No acute osseous abnormality.
IMPRESSION: Low lung volumes with small left pleural effusion, decreased in size
from radiographs 6 weeks prior. Streaky bibasilar atelectasis.

## 2017-05-07 IMAGING — US US PARACENTESIS
1 series · 2 of 2 positions shown · non-contrast
Comparison: none

INDICATION: Patient with history of cirrhosis and recurrent ascites presents for
therapeutic paracentesis.

[Series 1: us paracentesis · 0.28mm/px · 2 of 2 slices shown]
[im 1/2]
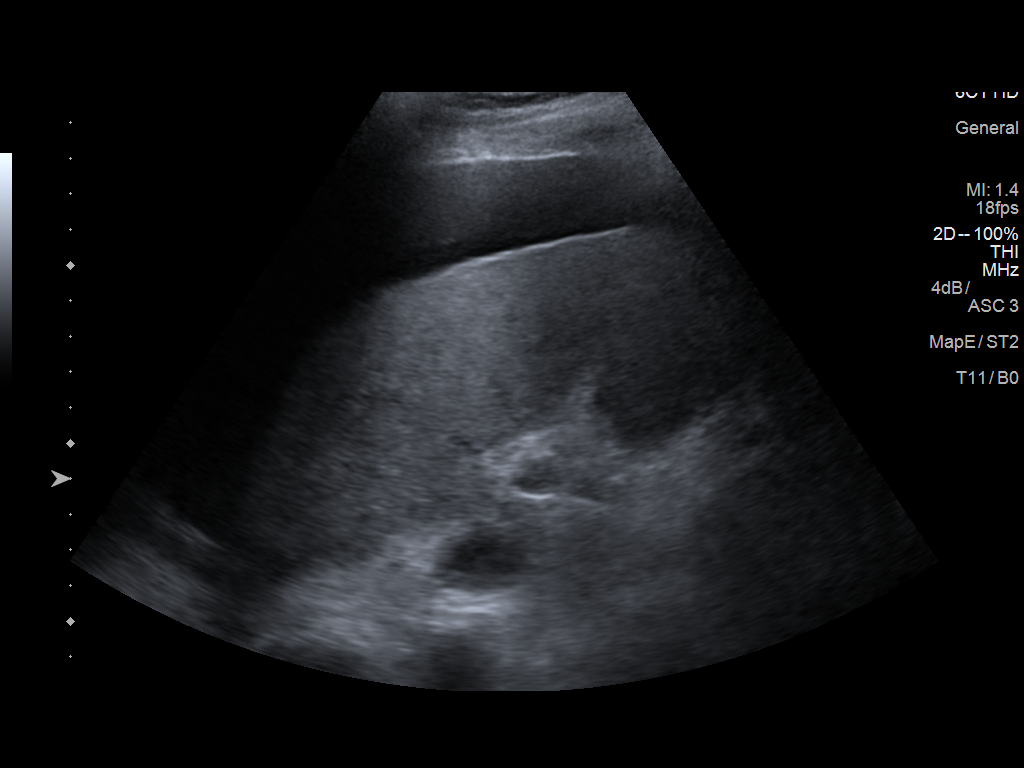
[im 2/2]
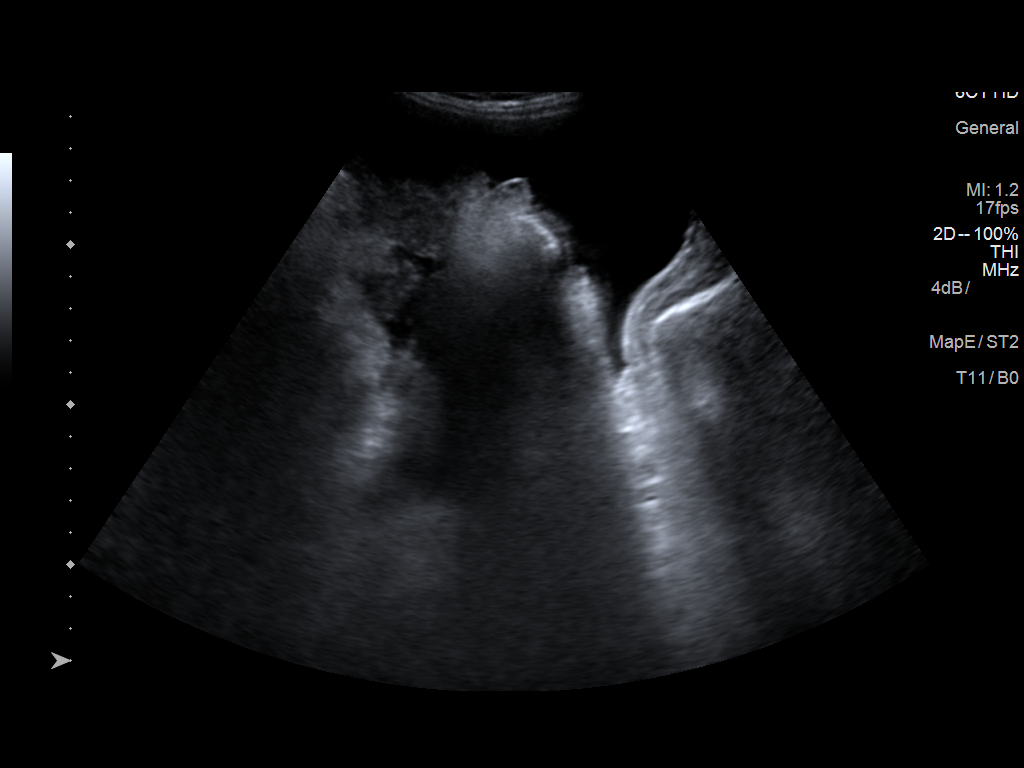

[2 of 2 positions shown; findings below may reference images not displayed]

EXAM:
ULTRASOUND GUIDED THERAPEUTIC PARACENTESIS

MEDICATIONS:
10 mL 1% lidocaine

COMPLICATIONS:
None immediate.

PROCEDURE:
Informed written consent was obtained from the patient after a
discussion of the risks, benefits and alternatives to treatment. A
timeout was performed prior to the initiation of the procedure.

Initial ultrasound scanning demonstrates a moderate amount of
ascites within the right lower abdomen. The right lower abdomen was
prepped and draped in the usual sterile fashion. 1% lidocaine was
used for local anesthesia.

Following this, a 19 gauge, 7-cm, Yueh catheter was introduced. An
ultrasound image was saved for documentation purposes. The
paracentesis was performed. The catheter was removed and a dressing
was applied. The patient tolerated the procedure well without
immediate post procedural complication.
FINDINGS: A total of approximately 7.0 liters of clear, yellow fluid was
removed.
IMPRESSION: Successful ultrasound-guided therapeutic paracentesis yielding 7
liters of peritoneal fluid.

## 2017-07-08 IMAGING — US US PARACENTESIS
1 series · 2 of 2 positions shown · non-contrast
Comparison: none

INDICATION: Decompensated alcoholic cirrhosis. Altered mental status.
Hypotension. Acute kidney injury. Recurrent ascites. Request
diagnostic and therapeutic paracentesis.

[Series 1: us paracentesis · 0.26mm/px · 2 of 2 slices shown]
[im 1/2]
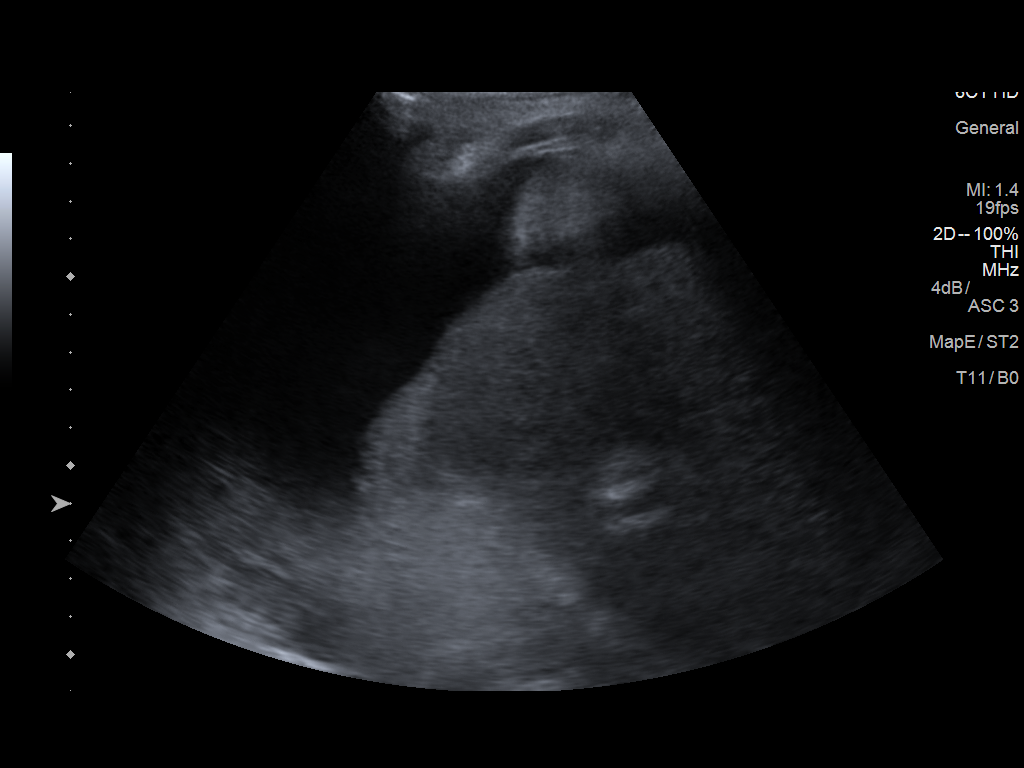
[im 2/2]
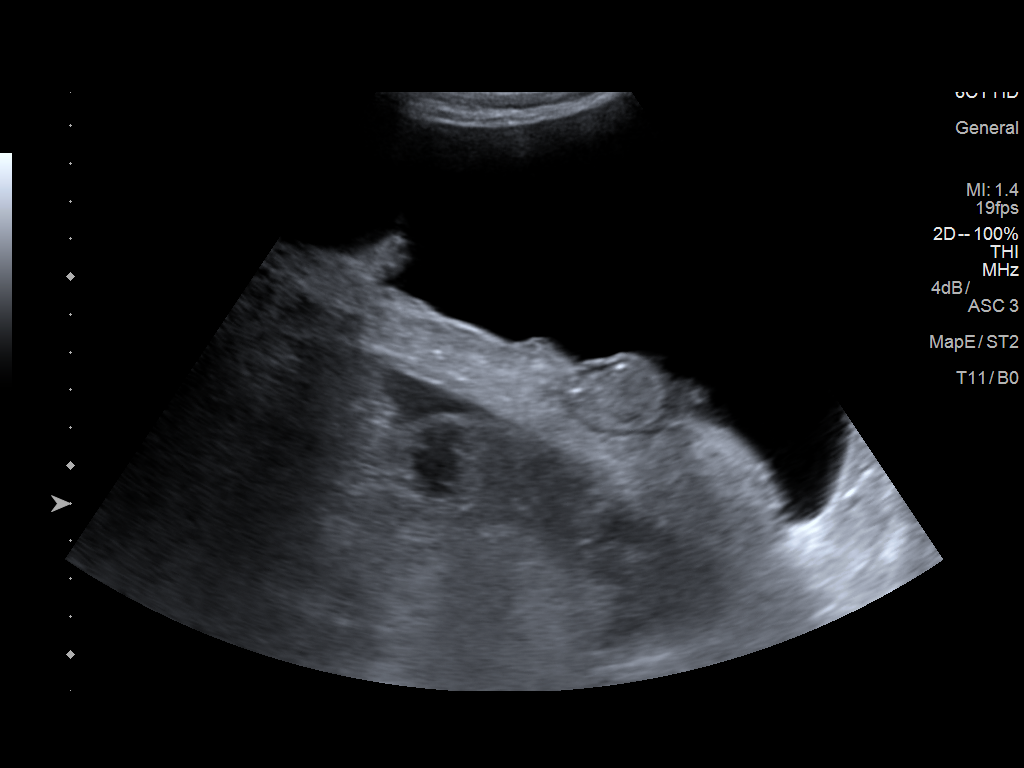

[2 of 2 positions shown; findings below may reference images not displayed]

EXAM:
ULTRASOUND GUIDED RIGHT LOWER QUADRANT PARACENTESIS

MEDICATIONS:
None.

COMPLICATIONS:
None immediate.

PROCEDURE:
Informed written consent was obtained from the patient after a
discussion of the risks, benefits and alternatives to treatment. A
timeout was performed prior to the initiation of the procedure.

Initial ultrasound scanning demonstrates a large amount of ascites
within the right lower abdominal quadrant. The right lower abdomen
was prepped and draped in the usual sterile fashion. 1% lidocaine
with epinephrine was used for local anesthesia.

Following this, a 19 gauge, 7-cm, Yueh catheter was introduced. An
ultrasound image was saved for documentation purposes. The
paracentesis was performed. The catheter was removed and a dressing
was applied. The patient tolerated the procedure well without
immediate post procedural complication.
FINDINGS: A total of approximately 2.5 L of clear yellow fluid was removed.
Procedure was terminated at this time due to hypotension. Samples
were sent to the laboratory as requested by the clinical team.
IMPRESSION: Successful ultrasound-guided paracentesis yielding 2.5 liters of
peritoneal fluid.
